# Patient Record
Sex: Male | Born: 1935 | Race: White | Hispanic: No | Marital: Married | Smoking: Former smoker
Health system: Southern US, Community
[De-identification: ages and names within clinical notes are randomized; demographics above are authoritative.]

## PROBLEM LIST (undated history)

## (undated) DIAGNOSIS — E538 Deficiency of other specified B group vitamins: Secondary | ICD-10-CM

## (undated) DIAGNOSIS — E039 Hypothyroidism, unspecified: Secondary | ICD-10-CM

## (undated) DIAGNOSIS — F0390 Unspecified dementia without behavioral disturbance: Secondary | ICD-10-CM

## (undated) DIAGNOSIS — E669 Obesity, unspecified: Secondary | ICD-10-CM

## (undated) DIAGNOSIS — R4182 Altered mental status, unspecified: Secondary | ICD-10-CM

## (undated) DIAGNOSIS — I10 Essential (primary) hypertension: Secondary | ICD-10-CM

## (undated) DIAGNOSIS — I5022 Chronic systolic (congestive) heart failure: Secondary | ICD-10-CM

## (undated) DIAGNOSIS — I2510 Atherosclerotic heart disease of native coronary artery without angina pectoris: Secondary | ICD-10-CM

## (undated) DIAGNOSIS — I255 Ischemic cardiomyopathy: Secondary | ICD-10-CM

## (undated) DIAGNOSIS — F329 Major depressive disorder, single episode, unspecified: Secondary | ICD-10-CM

## (undated) DIAGNOSIS — Z950 Presence of cardiac pacemaker: Secondary | ICD-10-CM

## (undated) DIAGNOSIS — N179 Acute kidney failure, unspecified: Secondary | ICD-10-CM

## (undated) DIAGNOSIS — I350 Nonrheumatic aortic (valve) stenosis: Secondary | ICD-10-CM

## (undated) DIAGNOSIS — S329XXA Fracture of unspecified parts of lumbosacral spine and pelvis, initial encounter for closed fracture: Secondary | ICD-10-CM

## (undated) DIAGNOSIS — I442 Atrioventricular block, complete: Secondary | ICD-10-CM

## (undated) DIAGNOSIS — F32A Depression, unspecified: Secondary | ICD-10-CM

## (undated) HISTORY — DX: Altered mental status, unspecified: R41.82

## (undated) HISTORY — DX: Obesity, unspecified: E66.9

## (undated) HISTORY — DX: Deficiency of other specified B group vitamins: E53.8

## (undated) HISTORY — DX: Fracture of unspecified parts of lumbosacral spine and pelvis, initial encounter for closed fracture: S32.9XXA

## (undated) HISTORY — DX: Depression, unspecified: F32.A

## (undated) HISTORY — DX: Major depressive disorder, single episode, unspecified: F32.9

## (undated) HISTORY — DX: Hypothyroidism, unspecified: E03.9

## (undated) HISTORY — DX: Atrioventricular block, complete: I44.2

## (undated) HISTORY — DX: Unspecified dementia, unspecified severity, without behavioral disturbance, psychotic disturbance, mood disturbance, and anxiety: F03.90

## (undated) HISTORY — DX: Atherosclerotic heart disease of native coronary artery without angina pectoris: I25.10

## (undated) HISTORY — PX: CHOLECYSTECTOMY: SHX55

## (undated) HISTORY — PX: INSERT / REPLACE / REMOVE PACEMAKER: SUR710

## (undated) HISTORY — DX: Ischemic cardiomyopathy: I25.5

## (undated) HISTORY — DX: Chronic systolic (congestive) heart failure: I50.22

## (undated) HISTORY — DX: Acute kidney failure, unspecified: N17.9

## (undated) HISTORY — PX: FRACTURE SURGERY: SHX138

## (undated) HISTORY — DX: Nonrheumatic aortic (valve) stenosis: I35.0

---

## 1999-11-17 ENCOUNTER — Encounter: Payer: Self-pay | Admitting: Orthopedic Surgery

## 1999-11-17 ENCOUNTER — Encounter: Admission: RE | Admit: 1999-11-17 | Discharge: 1999-11-17 | Payer: Self-pay | Admitting: Orthopedic Surgery

## 1999-11-23 ENCOUNTER — Ambulatory Visit (HOSPITAL_COMMUNITY): Admission: RE | Admit: 1999-11-23 | Discharge: 1999-11-23 | Payer: Self-pay | Admitting: Orthopedic Surgery

## 1999-11-23 ENCOUNTER — Encounter: Payer: Self-pay | Admitting: Orthopedic Surgery

## 1999-11-30 ENCOUNTER — Encounter: Payer: Self-pay | Admitting: Orthopedic Surgery

## 1999-12-03 ENCOUNTER — Inpatient Hospital Stay (HOSPITAL_COMMUNITY): Admission: RE | Admit: 1999-12-03 | Discharge: 1999-12-04 | Payer: Self-pay | Admitting: Orthopedic Surgery

## 1999-12-03 ENCOUNTER — Encounter: Payer: Self-pay | Admitting: Orthopedic Surgery

## 1999-12-16 ENCOUNTER — Emergency Department (HOSPITAL_COMMUNITY): Admission: EM | Admit: 1999-12-16 | Discharge: 1999-12-16 | Payer: Self-pay | Admitting: Emergency Medicine

## 1999-12-25 ENCOUNTER — Encounter: Admission: RE | Admit: 1999-12-25 | Discharge: 1999-12-25 | Payer: Self-pay | Admitting: Family Medicine

## 1999-12-25 ENCOUNTER — Encounter: Payer: Self-pay | Admitting: Family Medicine

## 1999-12-26 ENCOUNTER — Emergency Department (HOSPITAL_COMMUNITY): Admission: EM | Admit: 1999-12-26 | Discharge: 1999-12-26 | Payer: Self-pay

## 2000-01-02 ENCOUNTER — Encounter: Admission: RE | Admit: 2000-01-02 | Discharge: 2000-01-02 | Payer: Self-pay | Admitting: Gastroenterology

## 2000-01-02 ENCOUNTER — Encounter: Payer: Self-pay | Admitting: Gastroenterology

## 2000-03-10 ENCOUNTER — Encounter: Admission: RE | Admit: 2000-03-10 | Discharge: 2000-06-08 | Payer: Self-pay | Admitting: Family Medicine

## 2000-05-30 ENCOUNTER — Inpatient Hospital Stay (HOSPITAL_COMMUNITY): Admission: EM | Admit: 2000-05-30 | Discharge: 2000-06-02 | Payer: Self-pay | Admitting: Emergency Medicine

## 2002-12-22 ENCOUNTER — Inpatient Hospital Stay (HOSPITAL_COMMUNITY): Admission: EM | Admit: 2002-12-22 | Discharge: 2002-12-24 | Payer: Self-pay | Admitting: Emergency Medicine

## 2002-12-23 ENCOUNTER — Encounter (INDEPENDENT_AMBULATORY_CARE_PROVIDER_SITE_OTHER): Payer: Self-pay | Admitting: Cardiology

## 2003-02-02 ENCOUNTER — Encounter: Admission: RE | Admit: 2003-02-02 | Discharge: 2003-02-02 | Payer: Self-pay | Admitting: Cardiology

## 2003-02-07 ENCOUNTER — Inpatient Hospital Stay (HOSPITAL_COMMUNITY): Admission: RE | Admit: 2003-02-07 | Discharge: 2003-02-11 | Payer: Self-pay | Admitting: Cardiology

## 2003-08-17 ENCOUNTER — Inpatient Hospital Stay (HOSPITAL_COMMUNITY): Admission: EM | Admit: 2003-08-17 | Discharge: 2003-08-18 | Payer: Self-pay | Admitting: Emergency Medicine

## 2003-11-30 ENCOUNTER — Emergency Department (HOSPITAL_COMMUNITY): Admission: EM | Admit: 2003-11-30 | Discharge: 2003-11-30 | Payer: Self-pay | Admitting: Emergency Medicine

## 2003-12-01 ENCOUNTER — Inpatient Hospital Stay (HOSPITAL_COMMUNITY): Admission: EM | Admit: 2003-12-01 | Discharge: 2003-12-06 | Payer: Self-pay | Admitting: Emergency Medicine

## 2004-05-22 ENCOUNTER — Inpatient Hospital Stay (HOSPITAL_COMMUNITY): Admission: EM | Admit: 2004-05-22 | Discharge: 2004-05-23 | Payer: Self-pay | Admitting: Emergency Medicine

## 2004-06-12 ENCOUNTER — Encounter: Admission: RE | Admit: 2004-06-12 | Discharge: 2004-06-12 | Payer: Self-pay | Admitting: Family Medicine

## 2004-08-01 ENCOUNTER — Emergency Department (HOSPITAL_COMMUNITY): Admission: EM | Admit: 2004-08-01 | Discharge: 2004-08-01 | Payer: Self-pay | Admitting: Emergency Medicine

## 2004-08-03 ENCOUNTER — Inpatient Hospital Stay (HOSPITAL_COMMUNITY): Admission: EM | Admit: 2004-08-03 | Discharge: 2004-08-10 | Payer: Self-pay | Admitting: Emergency Medicine

## 2004-08-07 HISTORY — PX: HEMIARTHROPLASTY HIP: SUR652

## 2005-06-07 ENCOUNTER — Inpatient Hospital Stay (HOSPITAL_COMMUNITY): Admission: EM | Admit: 2005-06-07 | Discharge: 2005-06-09 | Payer: Self-pay | Admitting: Emergency Medicine

## 2005-06-21 ENCOUNTER — Ambulatory Visit (HOSPITAL_COMMUNITY): Admission: RE | Admit: 2005-06-21 | Discharge: 2005-06-21 | Payer: Self-pay | Admitting: Urology

## 2005-06-21 HISTORY — PX: CIRCUMCISION: SHX1350

## 2006-01-16 ENCOUNTER — Encounter: Admission: RE | Admit: 2006-01-16 | Discharge: 2006-01-16 | Payer: Self-pay | Admitting: Cardiology

## 2006-01-16 ENCOUNTER — Inpatient Hospital Stay (HOSPITAL_COMMUNITY): Admission: EM | Admit: 2006-01-16 | Discharge: 2006-01-20 | Payer: Self-pay | Admitting: Emergency Medicine

## 2006-01-16 ENCOUNTER — Encounter (INDEPENDENT_AMBULATORY_CARE_PROVIDER_SITE_OTHER): Payer: Self-pay | Admitting: Cardiovascular Disease

## 2006-07-14 ENCOUNTER — Inpatient Hospital Stay (HOSPITAL_COMMUNITY): Admission: EM | Admit: 2006-07-14 | Discharge: 2006-07-16 | Payer: Self-pay | Admitting: Emergency Medicine

## 2006-09-10 ENCOUNTER — Inpatient Hospital Stay (HOSPITAL_COMMUNITY): Admission: EM | Admit: 2006-09-10 | Discharge: 2006-09-13 | Payer: Self-pay | Admitting: Emergency Medicine

## 2006-09-10 ENCOUNTER — Ambulatory Visit: Payer: Self-pay | Admitting: Cardiology

## 2006-09-17 ENCOUNTER — Ambulatory Visit: Payer: Self-pay | Admitting: Internal Medicine

## 2006-09-18 ENCOUNTER — Observation Stay (HOSPITAL_COMMUNITY): Admission: AD | Admit: 2006-09-18 | Discharge: 2006-09-19 | Payer: Self-pay | Admitting: Internal Medicine

## 2006-09-18 ENCOUNTER — Ambulatory Visit: Payer: Self-pay | Admitting: Internal Medicine

## 2006-10-23 ENCOUNTER — Ambulatory Visit: Payer: Self-pay | Admitting: Internal Medicine

## 2006-10-23 LAB — CONVERTED CEMR LAB
BUN: 19 mg/dL (ref 6–23)
CO2: 29 meq/L (ref 19–32)
GFR calc non Af Amer: 51 mL/min
Glucose, Bld: 161 mg/dL — ABNORMAL HIGH (ref 70–99)
Potassium: 3.8 meq/L (ref 3.5–5.1)

## 2006-11-13 ENCOUNTER — Ambulatory Visit: Payer: Self-pay | Admitting: Internal Medicine

## 2007-01-21 ENCOUNTER — Inpatient Hospital Stay (HOSPITAL_COMMUNITY): Admission: EM | Admit: 2007-01-21 | Discharge: 2007-01-24 | Payer: Self-pay | Admitting: Emergency Medicine

## 2007-01-21 ENCOUNTER — Ambulatory Visit: Payer: Self-pay | Admitting: Cardiovascular Disease

## 2007-03-12 ENCOUNTER — Ambulatory Visit: Payer: Self-pay | Admitting: Cardiology

## 2007-03-24 ENCOUNTER — Ambulatory Visit: Payer: Self-pay | Admitting: Cardiology

## 2007-03-24 ENCOUNTER — Ambulatory Visit: Payer: Self-pay | Admitting: Internal Medicine

## 2007-03-25 ENCOUNTER — Encounter: Payer: Self-pay | Admitting: Cardiology

## 2007-03-25 ENCOUNTER — Inpatient Hospital Stay (HOSPITAL_COMMUNITY): Admission: AD | Admit: 2007-03-25 | Discharge: 2007-03-26 | Payer: Self-pay | Admitting: Cardiology

## 2007-03-25 ENCOUNTER — Ambulatory Visit: Payer: Self-pay | Admitting: Vascular Surgery

## 2007-09-16 ENCOUNTER — Ambulatory Visit: Payer: Self-pay | Admitting: Internal Medicine

## 2007-12-20 ENCOUNTER — Ambulatory Visit: Payer: Self-pay | Admitting: Internal Medicine

## 2007-12-20 ENCOUNTER — Inpatient Hospital Stay (HOSPITAL_COMMUNITY): Admission: EM | Admit: 2007-12-20 | Discharge: 2007-12-31 | Payer: Self-pay | Admitting: Emergency Medicine

## 2007-12-28 ENCOUNTER — Ambulatory Visit: Payer: Self-pay | Admitting: Physical Medicine & Rehabilitation

## 2008-01-07 ENCOUNTER — Encounter: Payer: Self-pay | Admitting: Internal Medicine

## 2008-01-20 ENCOUNTER — Ambulatory Visit: Payer: Self-pay | Admitting: Cardiology

## 2008-01-20 ENCOUNTER — Ambulatory Visit: Payer: Self-pay | Admitting: Internal Medicine

## 2008-05-25 DIAGNOSIS — I5022 Chronic systolic (congestive) heart failure: Secondary | ICD-10-CM

## 2008-05-25 DIAGNOSIS — E785 Hyperlipidemia, unspecified: Secondary | ICD-10-CM | POA: Insufficient documentation

## 2008-05-25 DIAGNOSIS — I2510 Atherosclerotic heart disease of native coronary artery without angina pectoris: Secondary | ICD-10-CM

## 2008-05-25 DIAGNOSIS — I10 Essential (primary) hypertension: Secondary | ICD-10-CM

## 2008-05-26 ENCOUNTER — Encounter: Payer: Self-pay | Admitting: Internal Medicine

## 2008-05-26 ENCOUNTER — Encounter (INDEPENDENT_AMBULATORY_CARE_PROVIDER_SITE_OTHER): Payer: Self-pay | Admitting: *Deleted

## 2008-05-26 ENCOUNTER — Ambulatory Visit: Payer: Self-pay | Admitting: Internal Medicine

## 2008-05-26 DIAGNOSIS — I442 Atrioventricular block, complete: Secondary | ICD-10-CM

## 2008-05-26 DIAGNOSIS — I238 Other current complications following acute myocardial infarction: Secondary | ICD-10-CM | POA: Insufficient documentation

## 2008-07-08 ENCOUNTER — Encounter (INDEPENDENT_AMBULATORY_CARE_PROVIDER_SITE_OTHER): Payer: Self-pay

## 2008-08-08 ENCOUNTER — Encounter: Payer: Self-pay | Admitting: Internal Medicine

## 2008-09-12 ENCOUNTER — Inpatient Hospital Stay (HOSPITAL_COMMUNITY): Admission: EM | Admit: 2008-09-12 | Discharge: 2008-09-14 | Payer: Self-pay | Admitting: Emergency Medicine

## 2008-09-12 ENCOUNTER — Ambulatory Visit: Payer: Self-pay | Admitting: Cardiovascular Disease

## 2008-09-12 ENCOUNTER — Ambulatory Visit: Payer: Self-pay | Admitting: Cardiology

## 2008-09-12 ENCOUNTER — Encounter: Payer: Self-pay | Admitting: Internal Medicine

## 2008-09-12 ENCOUNTER — Telehealth: Payer: Self-pay | Admitting: Internal Medicine

## 2008-09-13 ENCOUNTER — Encounter: Payer: Self-pay | Admitting: Cardiology

## 2008-09-15 ENCOUNTER — Encounter: Admission: RE | Admit: 2008-09-15 | Discharge: 2008-09-15 | Payer: Self-pay | Admitting: Internal Medicine

## 2008-09-15 ENCOUNTER — Telehealth: Payer: Self-pay | Admitting: Internal Medicine

## 2008-09-21 ENCOUNTER — Telehealth: Payer: Self-pay | Admitting: Internal Medicine

## 2008-09-23 ENCOUNTER — Encounter: Payer: Self-pay | Admitting: Nurse Practitioner

## 2008-09-23 ENCOUNTER — Ambulatory Visit: Payer: Self-pay | Admitting: Cardiovascular Disease

## 2008-09-23 DIAGNOSIS — F329 Major depressive disorder, single episode, unspecified: Secondary | ICD-10-CM

## 2008-09-28 ENCOUNTER — Telehealth: Payer: Self-pay | Admitting: Internal Medicine

## 2009-01-11 ENCOUNTER — Encounter (INDEPENDENT_AMBULATORY_CARE_PROVIDER_SITE_OTHER): Payer: Self-pay | Admitting: *Deleted

## 2009-02-14 ENCOUNTER — Encounter (INDEPENDENT_AMBULATORY_CARE_PROVIDER_SITE_OTHER): Payer: Self-pay | Admitting: *Deleted

## 2009-02-18 ENCOUNTER — Encounter (INDEPENDENT_AMBULATORY_CARE_PROVIDER_SITE_OTHER): Payer: Self-pay | Admitting: *Deleted

## 2009-02-20 ENCOUNTER — Telehealth: Payer: Self-pay | Admitting: Internal Medicine

## 2009-02-23 ENCOUNTER — Encounter: Payer: Self-pay | Admitting: Internal Medicine

## 2009-03-25 ENCOUNTER — Encounter: Payer: Self-pay | Admitting: Internal Medicine

## 2009-03-28 ENCOUNTER — Ambulatory Visit: Payer: Self-pay | Admitting: Internal Medicine

## 2009-03-28 DIAGNOSIS — I4891 Unspecified atrial fibrillation: Secondary | ICD-10-CM

## 2009-04-26 ENCOUNTER — Emergency Department (HOSPITAL_COMMUNITY): Admission: EM | Admit: 2009-04-26 | Discharge: 2009-04-26 | Payer: Self-pay | Admitting: Emergency Medicine

## 2009-04-29 ENCOUNTER — Inpatient Hospital Stay (HOSPITAL_COMMUNITY): Admission: EM | Admit: 2009-04-29 | Discharge: 2009-05-04 | Payer: Self-pay | Admitting: Emergency Medicine

## 2009-05-25 ENCOUNTER — Ambulatory Visit: Payer: Self-pay | Admitting: Internal Medicine

## 2009-12-25 ENCOUNTER — Telehealth: Payer: Self-pay | Admitting: Internal Medicine

## 2009-12-26 ENCOUNTER — Encounter: Admission: RE | Admit: 2009-12-26 | Discharge: 2009-12-26 | Payer: Self-pay | Admitting: Internal Medicine

## 2010-01-17 ENCOUNTER — Encounter: Payer: Self-pay | Admitting: Internal Medicine

## 2010-01-17 ENCOUNTER — Ambulatory Visit: Payer: Self-pay | Admitting: Internal Medicine

## 2010-01-18 ENCOUNTER — Telehealth: Payer: Self-pay | Admitting: Internal Medicine

## 2010-04-26 NOTE — Letter (Signed)
Summary: [facility]   Imported By: Kassie Mends 05/15/2009 13:05:05  _____________________________________________________________________  External Attachment:    Type:   Image     Comment:   External Document

## 2010-04-26 NOTE — Assessment & Plan Note (Signed)
Summary: 6 month rck/mt   Visit Type:  Follow-up Primary Provider:  jones,tommy MD  CC:  no complaints.  History of Present Illness:  [name] is an 77 y/o male with multiple medical problems including dementia, CAD, CHF due to ischemic CM with EF 40-45%. He has cardiomems sensor in place. He is currently living in a nursing home. Returns for f/u.   Doing well. Denies CP, sob or edema. Says he is mostly in a wheelchair but occasionally uses a walker. Gets all meds from the nursing home. No orthopnea or PND. Occasional bruising. Not taking a diuretic.   We discussed Code status and he confirms that he wnats to be full Code.  Current Medications (verified): 1)  Zoloft 50 Mg Tabs (Sertraline Hcl) .Marland Kitchen.. 1 Daily 2)  Amaryl 4 Mg Tabs (Glimepiride) .... Take 1 Once Daily 3)  Reglan 5 Mg Tabs (Metoclopramide Hcl) .... Take 1 Once Daily 4)  Plavix 75 Mg Tabs (Clopidogrel Bisulfate) .... Take One Tablet By Mouth Daily 5)  Multivitamins   Tabs (Multiple Vitamin) .... Once Daily 6)  Aspirin 81 Mg Tbec (Aspirin) .... Take One Tablet By Mouth Daily 7)  Synthroid 100 Mcg Tabs (Levothyroxine Sodium) .... Take 1 Once Daily 8)  Xanax 0.25 Mg Tabs (Alprazolam) .... Two Times A Day As Needed 9)  Nitroglycerin 0.4 Mg Subl (Nitroglycerin) .... One Tablet Under Tongue Every 5 Minutes As Needed For Chest Pain---May Repeat Times Three 10)  Coreg 3.125 Mg Tabs (Carvedilol) .Marland Kitchen.. 1 Twice A Day 11)  Vitamin D (Ergocalciferol) 50000 Unit Caps (Ergocalciferol) .... Monthly 12)  Cobal-1000 1000 Mcg/ml Soln (Cyanocobalamin) .... Monthly 13)  Acetaminophen 325 Mg  Tabs (Acetaminophen) .... As Needed 14)  Haloperidol 5 Mg Tabs (Haloperidol) .... As Needed 15)  Diphenhydramine Hcl 25 Mg  Caps (Diphenhydramine Hcl) .... As Needed 16)  Robitussin Dm 100-10 Mg/83ml Syrp (Dextromethorphan-Guaifenesin) .... As Needed 17)  Acetaminophen 325 Mg  Tabs (Acetaminophen) .... As Needed 18)  Imodium A-D 2 Mg Tabs (Loperamide Hcl)  .... As Needed 19)  Haloperidol 5 Mg Tabs (Haloperidol) .... Every 8 Hrs As Needed 20)  Simvastatin 20 Mg Tabs (Simvastatin) .... Take One Tablet By Mouth Daily At Bedtime 21)  Folic Acid   Powd (Folic Acid) .... Once Daily  Allergies (verified): 1)  ! Codeine 2)  ! Phenergan 3)  ! Ativan  Past History:  Past Medical History: Last updated: 05/26/2008  1. CHF      a.  Mixed cardiomyopathy EF 40-45%      b.  s/p implantation of CardioMems pulmonary artery sensor  2. CAD       a.  s/p remote balloon PTCA of the LAD with 50% residual.   3. Poorly controlled hypertension.   4. Hyperthyroidism.   5. Diabetes, diet controlled.   6. Depression.   7. B12 deficiency.   8. Bradycardia status post dual chamber pacemaker placed by Dr. Armanda Magic of [facility].   9. Dyslipidemia.  10. Klebsiella UTI and septic shock 9/09  Review of Systems       As per HPI and past medical history; otherwise all systems negative.   Vital Signs:  Patient profile:   77 year old male Height:      69 inches Weight:      206 pounds BMI:     30.53 Pulse rate:   80 / minute BP sitting:   128 / 70  (left arm) Cuff size:  regular  Vitals Entered By: Hardin Negus, RMA (January 17, 2010 3:17 PM)  Physical Exam  General:  Elderly male sitting in [facility] no acute distress. HEENT Normal.  Neck veins were flat, carotids were brisk.  Lungs minimal basilarcrackles Heart sounds were regular without murmurs or gallops.  Abdomen was soft with active bowel sounds. There is no clubbing cyanosis; trace edema Skin Warm and dry Alert. Follows commands. Affect pleasant.   PPM Specifications Following MD:  Sherryl Manges, MD     PPM Vendor:  Medtronic     PPM Model Number:  [phone]     PPM Serial Number:  [id number] PPM DOI:  12/23/2002     PPM Implanting MD:  Sherryl Manges, MD  Lead 1    Location: RA     DOI: 12/23/2002     Model #: [id number]     Serial #: [id number]     Status: active Lead 2    Location:  RV     DOI: 12/23/2002     Model #: [id number]     Serial #: [id number]     Status: active   Indications:  CHB   PPM Follow Up Pacer Dependent:  Yes      Episodes Coumadin:  No  Parameters Mode:  DDDR     Lower Rate Limit:  70     Upper Rate Limit:  120 Paced AV Delay:  180     Sensed AV Delay:  160  Impression & Recommendations:  Problem # 1:  CAD, NATIVE VESSEL (ICD-414.01) Stable. No evidence of ischemia. Continue current regimen.  Problem # 2:  SYSTOLIC HEART FAILURE, CHRONIC (ICD-428.22) Doing well. Volume status looks good. Unable to tolerate ACE-I or further titration of b-blocker.   Problem # 3:  PACEMAKER,DDD MDT (ICD-V45.01) Interrogated today.  Device functioning normally.   Other Orders: EKG w/ Interpretation (93000)  Patient Instructions: 1)  Your physician wants you to follow-up in: 1 year  You will receive a reminder letter in the mail two months in advance. If you don't receive a letter, please call our office to schedule the follow-up appointment.

## 2010-04-26 NOTE — Procedures (Signed)
Summary: [facility] Device [facility]   Current Medications (verified): 1)  Zoloft 50 Mg Tabs (Sertraline Hcl) .Marland Kitchen.. 1 Daily 2)  Amaryl 4 Mg Tabs (Glimepiride) .... Take 1 Once Daily 3)  Reglan 5 Mg Tabs (Metoclopramide Hcl) .... Take 1 Once Daily 4)  Plavix 75 Mg Tabs (Clopidogrel Bisulfate) .... Take One Tablet By Mouth Daily 5)  Multivitamins   Tabs (Multiple Vitamin) .... Once Daily 6)  Aspirin 81 Mg Tbec (Aspirin) .... Take One Tablet By Mouth Daily 7)  Synthroid 100 Mcg Tabs (Levothyroxine Sodium) .... Take 1 Once Daily 8)  Xanax 0.25 Mg Tabs (Alprazolam) .... Two Times A Day As Needed 9)  Nitroglycerin 0.4 Mg Subl (Nitroglycerin) .... One Tablet Under Tongue Every 5 Minutes As Needed For Chest Pain---May Repeat Times Three 10)  Coreg 3.125 Mg Tabs (Carvedilol) .Marland Kitchen.. 1 Twice A Day 11)  Vitamin D (Ergocalciferol) 50000 Unit Caps (Ergocalciferol) .... Monthly 12)  Cobal-1000 1000 Mcg/ml Soln (Cyanocobalamin) .... Monthly 13)  Acetaminophen 325 Mg  Tabs (Acetaminophen) .... As Needed 14)  Haloperidol 5 Mg Tabs (Haloperidol) .... As Needed 15)  Diphenhydramine Hcl 25 Mg  Caps (Diphenhydramine Hcl) .... As Needed 16)  Robitussin Dm 100-10 Mg/8ml Syrp (Dextromethorphan-Guaifenesin) .... As Needed 17)  Acetaminophen 325 Mg  Tabs (Acetaminophen) .... As Needed 18)  Imodium A-D 2 Mg Tabs (Loperamide Hcl) .... As Needed 19)  Haloperidol 5 Mg Tabs (Haloperidol) .... Every 8 Hrs As Needed 20)  Simvastatin 20 Mg Tabs (Simvastatin) .... Take One Tablet By Mouth Daily At Bedtime 21)  Folic Acid   Powd (Folic Acid) .... Once Daily  Allergies (verified): 1)  ! Codeine 2)  ! Phenergan 3)  ! Ativan  PPM Specifications Following MD:  Sherryl Manges, MD     PPM Vendor:  Medtronic     PPM Model Number:  [id number]     PPM Serial Number:  [id number] PPM DOI:  12/23/2002     PPM Implanting MD:  Sherryl Manges, MD  Lead 1    Location: RA     DOI: 12/23/2002     Model #: 5594     Serial #: [id number]     Status:  active Lead 2    Location: RV     DOI: 12/23/2002     Model #: 3086     Serial #: [id number]     Status: active   Indications:  CHB   PPM Follow Up Battery Voltage:  2.74 V     Battery Est. Longevity:  24 mths     Pacer Dependent:  Yes       PPM Device Measurements Atrium  Amplitude: 2.00 mV, Impedance: 511 ohms, Threshold: 1.00 V at 0.50 msec Right Ventricle  Amplitude: PACED mV, Impedance: 700 ohms, Threshold: 1.00 V at 0.40 msec  Episodes MS Episodes:  31     Percent Mode Switch:  0     Coumadin:  No Ventricular High Rate:  0     Atrial Pacing:  99.4%     Ventricular Pacing:  99.9%  Parameters Mode:  DDIR     Lower Rate Limit:  70     Upper Rate Limit:  120 Paced AV Delay:  180     Sensed AV Delay:  160 Next Cardiology Appt Due:  06/25/2010 Tech Comments:  31 AHR EPISODES--LONGEST WAS 40 MINUTES 30 SECONDS.  NORMAL DEVICE FUNCTION.  NO CHANGES MADE. ROV IN 6 MTHS W/DEVICE CLINIC. Vella Kohler  January 17, 2010 4:05 PM

## 2010-04-26 NOTE — Progress Notes (Signed)
Summary: re meds/appt with pa  Phone Note Call from Patient   Caller: Daughter susan Reason for Call: Talk to Nurse Summary of Call: pls call pt's dtr susan, pt hasn't had any meds on sunday or so far today due to place on his head that's bleeding-also wants to know if our pa can see him, i told her i didn't think so, since it was not heart related- [phone] Initial call taken by: Glynda Jaeger,  December 25, 2009 11:30 AM  Follow-up for Phone Call        Left message to call back Meredith Staggers, RN  December 25, 2009 5:59 PM   spoke w/pts daughter, pt has been having severe nose bleeds, he soaks the bed and will have puddles of blood in the floor, she is very frustrated w/[facility] and wanted him to be seen today advised not cardiac no one could see him today she states he is still bleeding and they have ice packs on his face and it is cont. she will take him to ER Meredith Staggers, RN  December 26, 2009 1:17 PM

## 2010-04-26 NOTE — Letter (Signed)
Summary: [facility] Diagnoses Sheet  [facility] Diagnoses Sheet   Imported By: Kassie Mends 05/15/2009 13:04:08  _____________________________________________________________________  External Attachment:    Type:   Image     Comment:   External Document

## 2010-04-26 NOTE — Letter (Signed)
Summary: [facility]  [facility]   Imported By: Kassie Mends 05/15/2009 13:02:29  _____________________________________________________________________  External Attachment:    Type:   Image     Comment:   External Document

## 2010-04-26 NOTE — Assessment & Plan Note (Signed)
Summary: df2/jss   CC:  pt having difficulty sleeping at night.  cat naps during the day.  Weakness in legs.  terrible cough according to pt daughter.  She would like it written that he receives atleast one xanax daily and preferrable in the evening.  History of Present Illness:    [name] is seen in followup for pacemaker implanted years ago for bradycardia further complicated by complete heart block. His ischemic heart disease with modest depression of LV systolic function at 40% and prior intervention of his LAD.  He has a history of recurrent and significant orthostatic lightheadedness resulting in multiple falls.  He has a recent intercurrent illness characterized by fever and significant cough is gradually getting better. The patient denies  chest pain, edema or palpitations   Current Medications (verified): 1)  Zoloft 50 Mg Tabs (Sertraline Hcl) .Marland Kitchen.. 1 Daily 2)  Amaryl 4 Mg Tabs (Glimepiride) .... Take 1 Once Daily 3)  Reglan 5 Mg Tabs (Metoclopramide Hcl) .... Take 1 Once Daily 4)  Zocor 20 Mg Tabs (Simvastatin) .... Take 1 At Bedtime 5)  Plavix 75 Mg Tabs (Clopidogrel Bisulfate) .... Take One Tablet By Mouth Daily 6)  Multivitamins   Tabs (Multiple Vitamin) .... Once Daily 7)  Aspirin 81 Mg Tbec (Aspirin) .... Take One Tablet By Mouth Daily 8)  Synthroid 100 Mcg Tabs (Levothyroxine Sodium) .... Take 1 Once Daily 9)  Xanax 0.5 Mg Tabs (Alprazolam) .... Two Times A Day As Needed 10)  Nitroglycerin 0.4 Mg Subl (Nitroglycerin) .... One Tablet Under Tongue Every 5 Minutes As Needed For Chest Pain---May Repeat Times Three 11)  Coreg 3.125 Mg Tabs (Carvedilol) .Marland Kitchen.. 1 Twice A Day 12)  Furosemide 40 Mg Tabs (Furosemide) .... Take 1 Tablet By Mouth Once A Day 13)  Imdur 30 Mg Xr24h-Tab (Isosorbide Mononitrate) .... Take 1 Tablet By Mouth Once A Day  Allergies (verified): 1)  ! Codeine 2)  ! Phenergan 3)  ! Ativan  Past History:  Past Medical History: Last updated:  05/26/2008  1. CHF      a.  Mixed cardiomyopathy EF 40-45%      b.  s/p implantation of CardioMems pulmonary artery sensor  2. CAD       a.  s/p remote balloon PTCA of the LAD with 50% residual.   3. Poorly controlled hypertension.   4. Hyperthyroidism.   5. Diabetes, diet controlled.   6. Depression.   7. B12 deficiency.   8. Bradycardia status post dual chamber pacemaker placed by Dr. Armanda Magic of [facility].   9. Dyslipidemia.  10. Klebsiella UTI and septic shock 9/09  Family History: Last updated: 05/25/2008 Noncontributory for early coronary disease, though his   father died in his 54s of unknown causes.   Social History: Last updated: 05/25/2008 He lives in [place] and lives alone.  His daughter is his   healthcare power of attorney.  He quit smoking 35 years ago after 25-30   pack-years.  He was [place].  He   is retired.     Vital Signs:  Patient profile:   77 year old male Height:      69 inches Weight:      212 pounds Pulse rate:   76 / minute Pulse rhythm:   regular BP sitting:   87 / 55  (left arm) Cuff size:   regular  Vitals Entered By: Judithe Modest  CMA (March 28, 2009 3:17 PM)  Physical Exam  General:  The patient was alert and oriented in no acute distress; occasionally coughing HEENT Normal.  Neck veins were flat, carotids were brisk.  Lungs were diffuse interstitial crackles Heart sounds were regular without murmurs or gallops.  Abdomen was soft with active bowel sounds. There is no clubbing cyanosis; trace edema Skin Warm and dry    PPM Specifications Following MD:  Sherryl Manges, MD     PPM Vendor:  Medtronic     PPM Model Number:  [id number]     PPM Serial Number:  [id number] PPM DOI:  12/23/2002     PPM Implanting MD:  Sherryl Manges, MD  Lead 1    Location: RA     DOI: 12/23/2002     Model #: [id number]     Serial #: [id number]     Status: active Lead 2    Location: RV     DOI: 12/23/2002      Model #: 3664     Serial #: [id number]     Status: active   Indications:  CHB   PPM Follow Up Remote Check?  No Battery Voltage:  2.76 V     Battery Est. Longevity:  3 YEARS     Pacer Dependent:  Yes       PPM Device Measurements Atrium  Amplitude: 2.8 mV, Impedance: 533 ohms,  Right Ventricle  Amplitude: PACED AT 30 mV, Impedance: 658 ohms, Threshold: 0.5 V at 0.4 msec  Episodes MS Episodes:  604     Percent Mode Switch:  58.9%     Coumadin:  No Ventricular High Rate:  0     Atrial Pacing:  62.2%     Ventricular Pacing:  99.7%  Parameters Mode:  DDDR     Lower Rate Limit:  70     Upper Rate Limit:  120 Paced AV Delay:  180     Sensed AV Delay:  160 Next Cardiology Appt Due:  09/22/2009 Tech Comments:  Normal device function.  Pt in atrial flutter today, apparently new diagnosis.  Heart rates controlled because of CHB.  No changes made today.  ROV 6 months device clinic. Gypsy Balsam RN BSN  March 28, 2009 3:23 PM   Impression & Recommendations:  Problem # 1:  ATRIAL FLUTTER (ICD-427.32) he is identified as having atrial flutter based on interrogation of his device.  I have reviewed with his daughter risks factors for thromboembolism. We have also reviewed the risks of oral anticoagulation therapy. In the history of his recurring falls, I think it is an inappropriate at this time to begin therapy with Coumadin. In the event that this situation can be a ameliorated he could be considered later (see below). His updated medication list for this problem includes:    Plavix 75 Mg Tabs (Clopidogrel bisulfate) .Marland Kitchen... Take one tablet by mouth daily    Aspirin 81 Mg Tbec (Aspirin) .Marland Kitchen... Take one tablet by mouth daily    Coreg 3.125 Mg Tabs (Carvedilol) .Marland Kitchen... 1 twice a day  Problem # 2:  ORTHOSTATIC DIZZINESS AND SYNCOPE (ICD-780.4) He is having recurrent problems with falls upon standing. Given his underlying low blood pressure, we will doctors in door. I have also instructed the  nursing facility to put his bed in reverse Trendelenburg at 6 inches. We discussed the potential role of ProAmatine, will hold off on further therapies however until we see how the nonmedicinal approaches work.  Problem #  3:  PACEMAKER,DDD MDT (ICD-V45.01) normal device function with interrogation demonstrated atrial flutter  Problem # 4:  CAD, NATIVE VESSEL (ICD-414.01) stable coronary disease. Given his hypotension I think we need to stop his Imdur which I done His updated medication list for this problem includes:    Plavix 75 Mg Tabs (Clopidogrel bisulfate) .Marland Kitchen... Take one tablet by mouth daily    Aspirin 81 Mg Tbec (Aspirin) .Marland Kitchen... Take one tablet by mouth daily    Nitroglycerin 0.4 Mg Subl (Nitroglycerin) ..... One tablet under tongue every 5 minutes as needed for chest pain---may repeat times three    Coreg 3.125 Mg Tabs (Carvedilol) .Marland Kitchen... 1 twice a day    Imdur 30 Mg Xr24h-tab (Isosorbide mononitrate) .Marland Kitchen... Take 1 tablet by mouth once a day  Problem # 5:  SYSTOLIC HEART FAILURE, CHRONIC (ICD-428.22) stable on his current medications will continue him on his carvedilol His updated medication list for this problem includes:    Plavix 75 Mg Tabs (Clopidogrel bisulfate) .Marland Kitchen... Take one tablet by mouth daily    Aspirin 81 Mg Tbec (Aspirin) .Marland Kitchen... Take one tablet by mouth daily    Nitroglycerin 0.4 Mg Subl (Nitroglycerin) ..... One tablet under tongue every 5 minutes as needed for chest pain---may repeat times three    Coreg 3.125 Mg Tabs (Carvedilol) .Marland Kitchen... 1 twice a day    Furosemide 40 Mg Tabs (Furosemide) .Marland Kitchen... Take 1 tablet by mouth once a day    Imdur 30 Mg Xr24h-tab (Isosorbide mononitrate) .Marland Kitchen... Take 1 tablet by mouth once a day  Patient Instructions: 1)  Your physician recommends that you schedule a follow-up appointment in: 12 MONTHS

## 2010-04-26 NOTE — Assessment & Plan Note (Signed)
Summary: f1y/jml/pt seen in ed for CHF   Visit Type:  Follow-up Primary Provider:  jones,tommy MD  CC:  no complaints.  History of Present Illness:  Mr. Beggs is an 77 y/o male with multiple medical problems including dementia, CAD, CHF due to ischemic CM with EF 40-45%. He has cardiomems sensor in place. He is currently living in a nursing home. Returns for f/u.   Just got out of the hospital for a fall and brorken pelvis and renal failure due to volume depletion. Now gets around in wheelchair. No CP, SOB or edema. No orthopnea or PND>     Current Medications (verified): 1)  Zoloft 50 Mg Tabs (Sertraline Hcl) .Marland Kitchen.. 1 Daily 2)  Amaryl 4 Mg Tabs (Glimepiride) .... Take 1 Once Daily 3)  Reglan 5 Mg Tabs (Metoclopramide Hcl) .... Take 1 Once Daily 4)  Plavix 75 Mg Tabs (Clopidogrel Bisulfate) .... Take One Tablet By Mouth Daily 5)  Multivitamins   Tabs (Multiple Vitamin) .... Once Daily 6)  Aspirin 81 Mg Tbec (Aspirin) .... Take One Tablet By Mouth Daily 7)  Synthroid 100 Mcg Tabs (Levothyroxine Sodium) .... Take 1 Once Daily 8)  Xanax 0.25 Mg Tabs (Alprazolam) .... Two Times A Day As Needed 9)  Nitroglycerin 0.4 Mg Subl (Nitroglycerin) .... One Tablet Under Tongue Every 5 Minutes As Needed For Chest Pain---May Repeat Times Three 10)  Coreg 3.125 Mg Tabs (Carvedilol) .Marland Kitchen.. 1 Twice A Day 11)  Furosemide 40 Mg Tabs (Furosemide) .... Take 1 Tablet By Mouth Once A Day 12)  Vitamin D (Ergocalciferol) 50000 Unit Caps (Ergocalciferol) .... Monthly 13)  Cobal-1000 1000 Mcg/ml Soln (Cyanocobalamin) .... Monthly 14)  Acetaminophen 325 Mg  Tabs (Acetaminophen) .... As Needed 15)  Haloperidol 5 Mg Tabs (Haloperidol) .... As Needed 16)  Diphenhydramine Hcl 25 Mg  Caps (Diphenhydramine Hcl) .... As Needed 17)  Robitussin Dm 100-10 Mg/11ml Syrp (Dextromethorphan-Guaifenesin) .... As Needed  Allergies (verified): 1)  ! Codeine 2)  ! Phenergan 3)  ! Ativan  Past History:  Past Medical  History: Last updated: 05/26/2008  1. CHF      a.  Mixed cardiomyopathy EF 40-45%      b.  s/p implantation of CardioMems pulmonary artery sensor  2. CAD       a.  s/p remote balloon PTCA of the LAD with 50% residual.   3. Poorly controlled hypertension.   4. Hyperthyroidism.   5. Diabetes, diet controlled.   6. Depression.   7. B12 deficiency.   8. Bradycardia status post dual chamber pacemaker placed by Dr. Armanda Magic of [facility].   9. Dyslipidemia.  10. Klebsiella UTI and septic shock 9/09  Review of Systems       As per HPI and past medical history; otherwise all systems negative.   Vital Signs:  Patient profile:   77 year old male Height:      69 inches Weight:      191 pounds Pulse rate:   80 / minute BP sitting:   100 / 52  (right arm) Cuff size:   regular  Vitals Entered By: Hardin Negus, RMA (May 25, 2009 2:40 PM)  Physical Exam  General:  Elderly male sitting in WC no acute distress. HEENT Normal.  Neck veins were flat, carotids were brisk.  Lungs minimal basilarcrackles Heart sounds were regular without murmurs or gallops.  Abdomen was soft with active bowel sounds. There is no clubbing cyanosis; trace edema  Skin Warm and dry Alert. Follows commands. Affect pleasant.    PPM Specifications Following MD:  Sherryl Manges, MD     PPM Vendor:  Medtronic     PPM Model Number:  [id number]     PPM Serial Number:  [id number] DOI:  12/23/2002     PPM Implanting MD:  Sherryl Manges, MD  Lead 1    Location: RA     DOI: 12/23/2002     Model #: 5594     Serial #: [id number]     Status: active Lead 2    Location: RV     DOI: 12/23/2002     Model #: 1027     Serial #: [id number]     Status: active   Indications:  CHB   PPM Follow Up Pacer Dependent:  Yes      Episodes Coumadin:  No  Parameters Mode:  DDDR     Lower Rate Limit:  70     Upper Rate Limit:  120 Paced AV Delay:  180     Sensed AV Delay:  160  Impression &  Recommendations:  Problem # 1:  CAD, NATIVE VESSEL (ICD-414.01) Stable. No evidence of ischemia. Continue current regimen.  Problem # 2:  SYSTOLIC HEART FAILURE, CHRONIC (ICD-428.22) Doing relatively well. Volume status looks good. Lisinopril previously on hold due to labile renal function. Will try to restart lisinopril at 5mg  once daily. BMET next week.

## 2010-04-26 NOTE — Progress Notes (Signed)
Summary: discuss office visit on yesterday  Phone Note Call from Patient Call back at Home Phone [phone]   Caller: Daughter-susan [phone]  or [phone] Reason for Call: Talk to Nurse Details for Reason: discuss visit from yesterday.  Initial call taken by: Lorne Skeens,  January 18, 2010 12:40 PM  Follow-up for Phone Call        pts daughter given update on appt. Meredith Staggers, RN  January 19, 2010 2:15 PM

## 2010-04-26 NOTE — Cardiovascular Report (Signed)
Summary: Office Visit   Office Visit   Imported By: Roderic Ovens 02/01/2010 14:29:59  _____________________________________________________________________  External Attachment:    Type:   Image     Comment:   External Document

## 2010-04-26 NOTE — Miscellaneous (Signed)
  Clinical Lists Changes  Medications: Removed medication of IMDUR 30 MG XR24H-TAB (ISOSORBIDE MONONITRATE) Take 1 tablet by mouth once a day

## 2010-06-13 LAB — CBC
HCT: 32.3 % — ABNORMAL LOW (ref 39.0–52.0)
HCT: 32.8 % — ABNORMAL LOW (ref 39.0–52.0)
HCT: 33.2 % — ABNORMAL LOW (ref 39.0–52.0)
HCT: 34.7 % — ABNORMAL LOW (ref 39.0–52.0)
Hemoglobin: 10.6 g/dL — ABNORMAL LOW (ref 13.0–17.0)
Hemoglobin: 11.1 g/dL — ABNORMAL LOW (ref 13.0–17.0)
Hemoglobin: 12.4 g/dL — ABNORMAL LOW (ref 13.0–17.0)
MCHC: 34.4 g/dL (ref 30.0–36.0)
MCHC: 34.8 g/dL (ref 30.0–36.0)
MCHC: 34.9 g/dL (ref 30.0–36.0)
MCV: 89.7 fL (ref 78.0–100.0)
MCV: 90.1 fL (ref 78.0–100.0)
Platelets: 138 10*3/uL — ABNORMAL LOW (ref 150–400)
Platelets: 212 10*3/uL (ref 150–400)
RBC: 3.43 MIL/uL — ABNORMAL LOW (ref 4.22–5.81)
RBC: 3.52 MIL/uL — ABNORMAL LOW (ref 4.22–5.81)
RBC: 3.64 MIL/uL — ABNORMAL LOW (ref 4.22–5.81)
RBC: 3.83 MIL/uL — ABNORMAL LOW (ref 4.22–5.81)
RBC: 3.95 MIL/uL — ABNORMAL LOW (ref 4.22–5.81)
RDW: 13.5 % (ref 11.5–15.5)
RDW: 13.7 % (ref 11.5–15.5)
RDW: 14.1 % (ref 11.5–15.5)
RDW: 14.3 % (ref 11.5–15.5)
WBC: 4.9 10*3/uL (ref 4.0–10.5)
WBC: 4.9 10*3/uL (ref 4.0–10.5)
WBC: 5.4 10*3/uL (ref 4.0–10.5)
WBC: 6.8 10*3/uL (ref 4.0–10.5)

## 2010-06-13 LAB — COMPREHENSIVE METABOLIC PANEL
ALT: 29 U/L (ref 0–53)
ALT: 29 U/L (ref 0–53)
ALT: 33 U/L (ref 0–53)
AST: 33 U/L (ref 0–37)
Alkaline Phosphatase: 125 U/L — ABNORMAL HIGH (ref 39–117)
Alkaline Phosphatase: 136 U/L — ABNORMAL HIGH (ref 39–117)
BUN: 62 mg/dL — ABNORMAL HIGH (ref 6–23)
CO2: 23 mEq/L (ref 19–32)
CO2: 24 mEq/L (ref 19–32)
Calcium: 8.2 mg/dL — ABNORMAL LOW (ref 8.4–10.5)
Chloride: 108 mEq/L (ref 96–112)
Chloride: 110 mEq/L (ref 96–112)
GFR calc non Af Amer: 30 mL/min — ABNORMAL LOW (ref >60)
GFR calc non Af Amer: 34 mL/min — ABNORMAL LOW (ref >60)
Glucose, Bld: 113 mg/dL — ABNORMAL HIGH (ref 70–99)
Glucose, Bld: 85 mg/dL (ref 70–99)
Glucose, Bld: 96 mg/dL (ref 70–99)
Potassium: 3.6 mEq/L (ref 3.5–5.1)
Potassium: 4.3 mEq/L (ref 3.5–5.1)
Sodium: 140 mEq/L (ref 135–145)
Sodium: 144 mEq/L (ref 135–145)
Sodium: 147 mEq/L — ABNORMAL HIGH (ref 135–145)
Total Bilirubin: 0.8 mg/dL (ref 0.3–1.2)
Total Bilirubin: 0.9 mg/dL (ref 0.3–1.2)
Total Protein: 6.7 g/dL (ref 6.0–8.3)
Total Protein: 6.8 g/dL (ref 6.0–8.3)

## 2010-06-13 LAB — BASIC METABOLIC PANEL
BUN: 40 mg/dL — ABNORMAL HIGH (ref 6–23)
CO2: 22 mEq/L (ref 19–32)
CO2: 23 mEq/L (ref 19–32)
CO2: 24 mEq/L (ref 19–32)
Calcium: 8 mg/dL — ABNORMAL LOW (ref 8.4–10.5)
Calcium: 8.4 mg/dL (ref 8.4–10.5)
Chloride: 107 mEq/L (ref 96–112)
Chloride: 110 mEq/L (ref 96–112)
Chloride: 112 mEq/L (ref 96–112)
Creatinine, Ser: 1.46 mg/dL (ref 0.4–1.5)
GFR calc Af Amer: 49 mL/min — ABNORMAL LOW (ref >60)
GFR calc Af Amer: 57 mL/min — ABNORMAL LOW (ref >60)
GFR calc Af Amer: >60 mL/min (ref >60)
Glucose, Bld: 110 mg/dL — ABNORMAL HIGH (ref 70–99)
Glucose, Bld: 150 mg/dL — ABNORMAL HIGH (ref 70–99)
Potassium: 3.9 mEq/L (ref 3.5–5.1)
Sodium: 137 mEq/L (ref 135–145)
Sodium: 140 mEq/L (ref 135–145)

## 2010-06-13 LAB — URINALYSIS, ROUTINE W REFLEX MICROSCOPIC
Glucose, UA: NEGATIVE mg/dL
Glucose, UA: NEGATIVE mg/dL
Hgb urine dipstick: NEGATIVE
Ketones, ur: NEGATIVE mg/dL
Protein, ur: NEGATIVE mg/dL
pH: 5 (ref 5.0–8.0)
pH: 5.5 (ref 5.0–8.0)

## 2010-06-13 LAB — DIFFERENTIAL
Basophils Absolute: 0 10*3/uL (ref 0.0–0.1)
Basophils Relative: 0 % (ref 0–1)
Basophils Relative: 0 % (ref 0–1)
Eosinophils Absolute: 0.2 10*3/uL (ref 0.0–0.7)
Eosinophils Absolute: 0.3 10*3/uL (ref 0.0–0.7)
Eosinophils Absolute: 0.8 10*3/uL — ABNORMAL HIGH (ref 0.0–0.7)
Lymphocytes Relative: 11 % — ABNORMAL LOW (ref 12–46)
Monocytes Absolute: 0.6 10*3/uL (ref 0.1–1.0)
Monocytes Absolute: 0.7 10*3/uL (ref 0.1–1.0)
Monocytes Relative: 9 % (ref 3–12)
Neutro Abs: 5.4 10*3/uL (ref 1.7–7.7)
Neutro Abs: 6.2 10*3/uL (ref 1.7–7.7)
Neutrophils Relative %: 78 % — ABNORMAL HIGH (ref 43–77)
Neutrophils Relative %: 80 % — ABNORMAL HIGH (ref 43–77)

## 2010-06-13 LAB — GLUCOSE, CAPILLARY
Glucose-Capillary: 103 mg/dL — ABNORMAL HIGH (ref 70–99)
Glucose-Capillary: 110 mg/dL — ABNORMAL HIGH (ref 70–99)
Glucose-Capillary: 111 mg/dL — ABNORMAL HIGH (ref 70–99)
Glucose-Capillary: 113 mg/dL — ABNORMAL HIGH (ref 70–99)
Glucose-Capillary: 119 mg/dL — ABNORMAL HIGH (ref 70–99)
Glucose-Capillary: 132 mg/dL — ABNORMAL HIGH (ref 70–99)
Glucose-Capillary: 159 mg/dL — ABNORMAL HIGH (ref 70–99)
Glucose-Capillary: 162 mg/dL — ABNORMAL HIGH (ref 70–99)
Glucose-Capillary: 169 mg/dL — ABNORMAL HIGH (ref 70–99)
Glucose-Capillary: 86 mg/dL (ref 70–99)
Glucose-Capillary: 90 mg/dL (ref 70–99)

## 2010-06-13 LAB — VITAMIN B12: Vitamin B-12: 434 pg/mL (ref 211–911)

## 2010-06-13 LAB — URINE MICROSCOPIC-ADD ON

## 2010-06-13 LAB — TSH: TSH: 2.457 u[IU]/mL (ref 0.350–4.500)

## 2010-06-13 LAB — RPR: RPR Ser Ql: NONREACTIVE

## 2010-07-02 LAB — CBC
HCT: 33.3 % — ABNORMAL LOW (ref 39.0–52.0)
Hemoglobin: 11.3 g/dL — ABNORMAL LOW (ref 13.0–17.0)
MCHC: 33.9 g/dL (ref 30.0–36.0)
MCV: 89.4 fL (ref 78.0–100.0)
RBC: 3.72 MIL/uL — ABNORMAL LOW (ref 4.22–5.81)

## 2010-07-02 LAB — DIFFERENTIAL
Basophils Relative: 0 % (ref 0–1)
Eosinophils Relative: 6 % — ABNORMAL HIGH (ref 0–5)
Monocytes Absolute: 0.5 10*3/uL (ref 0.1–1.0)
Monocytes Relative: 9 % (ref 3–12)
Neutro Abs: 3.9 10*3/uL (ref 1.7–7.7)

## 2010-07-02 LAB — BASIC METABOLIC PANEL
CO2: 25 mEq/L (ref 19–32)
CO2: 25 mEq/L (ref 19–32)
CO2: 26 mEq/L (ref 19–32)
Calcium: 8.6 mg/dL (ref 8.4–10.5)
Calcium: 8.8 mg/dL (ref 8.4–10.5)
Chloride: 108 mEq/L (ref 96–112)
Chloride: 110 mEq/L (ref 96–112)
GFR calc Af Amer: 49 mL/min — ABNORMAL LOW (ref >60)
GFR calc non Af Amer: 41 mL/min — ABNORMAL LOW (ref >60)
Glucose, Bld: 95 mg/dL (ref 70–99)
Glucose, Bld: 98 mg/dL (ref 70–99)
Potassium: 3.9 mEq/L (ref 3.5–5.1)
Potassium: 4.6 mEq/L (ref 3.5–5.1)
Sodium: 139 mEq/L (ref 135–145)
Sodium: 142 mEq/L (ref 135–145)
Sodium: 142 mEq/L (ref 135–145)

## 2010-07-02 LAB — URINALYSIS, ROUTINE W REFLEX MICROSCOPIC
Ketones, ur: NEGATIVE mg/dL
Nitrite: NEGATIVE
Protein, ur: NEGATIVE mg/dL
Urobilinogen, UA: 1 mg/dL (ref 0.0–1.0)

## 2010-07-02 LAB — GLUCOSE, CAPILLARY
Glucose-Capillary: 146 mg/dL — ABNORMAL HIGH (ref 70–99)
Glucose-Capillary: 151 mg/dL — ABNORMAL HIGH (ref 70–99)

## 2010-07-02 LAB — APTT: aPTT: 48 seconds — ABNORMAL HIGH (ref 24–37)

## 2010-07-02 LAB — CK TOTAL AND CKMB (NOT AT ARMC): Total CK: 56 U/L (ref 7–232)

## 2010-07-02 LAB — POCT CARDIAC MARKERS
CKMB, poc: 2 ng/mL (ref 1.0–8.0)
Troponin i, poc: <0.05 ng/mL (ref 0.00–0.09)

## 2010-07-02 LAB — URINE CULTURE

## 2010-08-07 NOTE — Assessment & Plan Note (Signed)
[facility]                          CHRONIC HEART FAILURE NOTE   [name], [name]                         MRN:          [id number]  DATE:11/13/2006                            DOB:          May 30, 1919    [name] returns today for followup.  His daughter called the office  earlier today stating her father was coughing.  She was concerned that  it was his heart failure and asked if I would work him into the schedule  to be seen today.  I did, and [name] is here complaining of some  intermittent cough that is nonproductive, denies any fever or chills.  Actually, the coughing began after he just had a central air  conditioning system installed in his house.  Previously was sleeping in  a bedroom that did not have air conditioning (he had a window unit in  the room next to his bedroom).  Now he is sleeping in a room with  central air on.  He thinks this is causing him to cough as he is not  accustomed to the cool air when he sleeps.  He denies any orthopnea or  PND, no peripheral edema, otherwise states he feels just fine.  Has  tolerated medications without any problems.  No chest discomfort, no  palpitations, lightheadedness, or dizziness.  Only complaint is he  states he has difficulty sleeping at night because of the cough.   PAST MEDICAL HISTORY:  1. Congestive heart failure secondary to nonischemic cardiomyopathy      with an ejection fraction, currently, 40% to 45%.  2. Remote history of cardiac catheterization, status post balloon      procedure to a blood vessel in 2004 per patient's report, had an      old documentation that states that the patient had an occluded left      anterior descending, status post PTCA with 50% residual.  3. History of poorly controlled hypertension.  4. Hyperthyroidism.  5. Diabetes, diet controlled.  6. History of depression.  7. B12 deficiency.  8. History of bradycardia, status post permanent pacemaker     implantation by Dr. Armanda Magic in the past.  9. Dyslipidemia.  10.Status post cholecystectomy  11.Status post appendectomy.  12.Status post implantation of a CardioMEMS pulmonary artery sensor      device in June of 2008 via right heart catheterization.   REVIEW OF SYSTEMS:  As stated above.   CURRENT MEDICATIONS:  1. Coreg 6.25 mg b.i.d.  2. Aspirin 81.  3. Vitamin B12 monthly.  4. Multivitamin daily.  5. Zoloft 50 daily.  6. Plavix 75 daily.  7. K-Dur 20 mEq b.i.d.  8. Lasix 40 b.i.d.  9. Synthroid 75 mcg daily.  10.Zocor 20 daily.  11.Lisinopril 20 daily.   PHYSICAL EXAMINATION:  Weight 193, weight is up 2 pounds from July  visit.  Blood pressure 120/70 with a heart rate of 75.  [name] is in no acute distress.  He is his usual pleasant self.  No jugular vein distension at 45 degree angle.  LUNGS:  Clear to  auscultation throughout.  I do not hear any crackles,  wheezing, or rhonchi.  CARDIOVASCULAR EXAM:  Reveals an S1 and S2.  Regular rate and rhythm.  ABDOMEN:  Soft, nontender, positive bowel sounds.  LOWER EXTREMITIES:  Without cyanosis, clubbing, or edema.  NEUROLOGIC:  He is alert and oriented x3.   IMPRESSION:  Congestive heart failure with no signs of volume overload  at this time.  I do not feel cough is related to his heart failure;  however, I have asked daughter to continue to monitor closely.  If he  develops any fever, chills, productive cough, or weight increases or  shortness of breath, she needs to call me.  She knows she can reach me  by cell phone.  Otherwise, we will continue current medications, and I  am going to have her increase his Coreg to 9.375 mg b.i.d.  I have given  them written instructions confirming this.   PRIMARY CARE PHYSICIAN:  Dr. [name].   PRIMARY CARDIOLOGIST:  Dr. [name].      Dorian Pod, ACNP  Electronically Signed      [name], MD  Electronically Signed   MB/MedQ  DD: 11/13/2006   DT: 11/14/2006  Job #: [id number]   cc:   [name], M.D.

## 2010-08-07 NOTE — H&P (Signed)
NAME:  [name], [name] NO.:  [id number]   MEDICAL RECORD NO.:  [id number]          PATIENT TYPE:  INP   LOCATION:  4735                         FACILITY:  [facility]   PHYSICIAN:  Veverly Fells. Excell Seltzer, MD  DATE OF BIRTH:  1919/04/21   DATE OF ADMISSION:  01/21/2007  DATE OF DISCHARGE:                              HISTORY & PHYSICAL   PRIMARY CARDIOLOGIST:  [name], M.D.   PRIMARY CARE PHYSICIAN:  [name], M.D.   HISTORY OF PRESENT ILLNESS:  This is an 77 year old Caucasian male well-  known to our practice with history of nonischemic cardiomyopathy, also  CAD status post PTCA to the left anterior descending artery, poorly  controlled hypertension, diabetes, bradycardia status post pacemaker,  and dyslipidemia.  The patient also has a past medical history of mixed  CHF diastolic and systolic and has been followed closely by Dorian Pod, ACNP at [facility] and does have  implantation of a CardioMEMS pulmonary artery sensor, which was placed  in June 2008, via the right heart cath.  The patient presents today  after 2 days of lower extremity edema, abdominal distention, and at the  concern of his daughter who looks after him, she felt he needed to come  to the emergency room.  The patient denies any chest pain, dyspnea,  diaphoresis, dizziness.  He did complain of some mild fullness in his  abdomen.  He denies any medical noncompliance.  He denies any PND or  orthopnea, but he normally sleeps on 2 pillows and has been for years.  The patient is very hard of hearing, making the interview very  difficult; however, he is of good mentation and is able to answer the  questions appropriately.   REVIEW OF SYSTEMS:  Positive for lower extremity edema, abdominal  distention, and generalized fatigue.  He is able to walk without  difficulty.   PAST MEDICAL HISTORY:  Includes:  1. Nonischemic cardiomyopathy with an EF of 45%.  2.  CAD status post balloon PTCA of the LAD with 50% residual.  3. Poorly controlled hypertension.  4. Hyperthyroidism.  5. Diabetes, diet controlled.  6. Depression.  7. B12 deficiency.  8. Bradycardia status post dual chamber pacemaker placed by Dr. Armanda Magic of [facility].  9. Dyslipidemia.  10.The patient has implantation of a CardioMEMS pulmonary artery      sensor on June 2008, via the right heart cath.   PAST SURGICAL HISTORY:  1. Cholecystectomy.  2. Appendectomy.   SOCIAL HISTORY:  He lives in [place] and lives alone.  He has a  daughter who is very attentive.  He has a past medical history of 35-  pack year of smoking, denies use of alcohol.  No drug use.   FAMILY HISTORY:  Not significant secondary to age and diagnoses.   CURRENT MEDICATIONS:  At home:  1. Coreg 6.25 mg b.i.d.  2. Aspirin 81 mg once a day.  3. Vitamin B12 monthly.  4. Multivitamin daily.  5. Zoloft 50 mg daily.  6.  Plavix 75 mg daily.  7. K-Dur 20 mEq b.i.d.  8. Lasix 40 mg b.i.d.  9. Synthroid 75 mcg daily.  10.Zocor 20 mg daily.  11.Lisinopril 20 mg daily.   ALLERGIES:  CODEINE, PHENERGAN, AND ATIVAN.   CURRENT LABORATORY DATA:  Sodium 137, potassium 4.0, chloride 103, CO2  46, BUN 20, creatinine 1.4, glucose 241.  Hemoglobin 13.3, hematocrit  39.0.   Chest x-ray revealing COPD, cardiac enlargement with mild pulmonary  vascular congestion and a pacemaker.   PHYSICAL EXAMINATION:  VITAL SIGNS:  Blood pressure 132/74, pulse 73,  respirations 18, temperature 97.3.  The patient's weight on last  [facility] evaluation 193 pounds.  He is at 95% on  room air.  HEENT:  Head is normocephalic and atraumatic.  Eyes:  Pupils equal,  round, reactive to light and accommodation.  The patient is edentulous.  NECK:  Supple.  There are no carotid bruits.  Positive JVD to the  mandible.  CARDIOVASCULAR:  Distant heart sounds.  Regular rate and rhythm.  Pulses  are 2+  and equal radially.  Difficult to palpate on the lower  extremities.  LUNGS:  Essentially clear to auscultation.  I am not hearing any  wheezes, rales, or rhonchi.  SKIN:  There is scaly, dry skin to the lower extremities.  ABDOMEN:  Distended, nontender, with 2+ bowel sounds.  EXTREMITIES:  With 1+ to 2+ pitting edema.  Difficult to feel distal  pulses.  He does have some venous stasis skin changes.  NEURO:  Cranial nerves II-XII are grossly intact with the exception of  very hard of hearing.   IMPRESSION:  1. Acute on chronic mixed congestive heart failure.  2. Hypertension.  3. Diabetes.  4. Dyslipidemia.   PLAN:  This is an 77 year old Caucasian male patient well-known to Dr.  Arvilla Meres with [facility] with mixed  nonischemic cardiomyopathy with 2 days of increasing lower extremity  edema, abdominal distention, and fullness.  Assessment reveals a mildly  distended abdomen 1+ to 2+ pitting edema in the lower extremities with  JVD.  The patient has been seen and examined by Dr. Veverly Fells. Cooper  and myself.  The patient will be admitted for diuresis with Lasix IV 40  mg t.i.d. and to continue his other medications as listed.  The patient  will also be on DVT prophylaxis.  The patient will be followed closely  by Dr. [name].  Would recommend considering aldactone in this patient  and also reviewing CardioMEMS data to assist in medication  adjustment if necessary.  This has been explained to the patient and  verbalizes understanding and is willing to be admitted.  We will follow  closely with strict I&O and daily weights to assess patient's response  to IV diuretics and make further recommendations based upon hospital  course and response to treatment.      Bettey Mare. Lyman Bishop, NP      Veverly Fells. Excell Seltzer, MD  Electronically Signed    KML/MEDQ  D:  01/21/2007  T:  01/22/2007  Job:  [id number]   cc:   [name], M.D.

## 2010-08-07 NOTE — H&P (Signed)
NAME:  [name], EASTRIDGE NO.:  [id number]   MEDICAL RECORD NO.:  [id number]          PATIENT TYPE:  INP   LOCATION:  3713                         FACILITY:  [facility]   PHYSICIAN:  Kela Millin, M.D.DATE OF BIRTH:  12/14/1919   DATE OF ADMISSION:  12/20/2007  DATE OF DISCHARGE:                              HISTORY & PHYSICAL   PRIMARY CARE PHYSICIAN:  Dr. [name].   CHIEF COMPLAINT:  Fevers and status post fall.   HISTORY OF PRESENT ILLNESS:  The patient is an 77 year old white male  with past medical history significant for ischemic cardiomyopathy/CHF;  his last ejection fraction was 40% to 45% per 2-D echocardiogram in  2008; coronary artery disease and status post PTCA in 2004, history of  bradycardia and status post pacemaker, hypertension, hypothyroidism,  diet-controlled diabetes (on no medications), depression, vitamin B12  deficiency, hyperlipidemia, status post CardioNet Pulmonary Artery  Sensor Monitor in 2008 who presents with the above complaints.  His  daughter is at the bedside assisting with the history and states that  the patient just slid off the bed onto the floor and was unable to get  up by himself, and she could not get him up either.  She reports that he  has had fever for the past 4 days as well as decreased p.o. intake.  He  has also been incontinent of urine and so has to wear diapers.  He  admits to a mild nonproductive cough.  Per his daughter, he appeared to  be complaining of headaches because he was holding onto his head.  When  asked, he denies headaches.  He admits to dysuria.  Per daughter, he has  had decreased p.o. intake in the past week.  She is concerned about his  living by himself at this time and would like to look into possible  placement.  The patient denies diarrhea, melena, nausea, vomiting, chest  pain, and hematochezia.   He was seen in the emergency room, and a CT scan of his head was done  with no acute  intracranial abnormality, stable chronic ventriculomegaly,  chronic small-vessel disease noted.  His urine showed large leukocyte  esterase with 21-50 WBCs and many bacteria.  His sed rate was elevated  at 104, and x-ray of his hip showed no acute findings, left hip  arthroplasty and osteopenia noted.  He is admitted for further  evaluation and management.   PAST MEDICAL HISTORY:  1. As stated above.  2. History of depression/anxiety.   MEDICATIONS:  1. Plavix 75 mg p.o. daily.  2. Synthroid 100 mcg daily.  3. Lisinopril 10 mg daily.  4. Xanax 0.5 mg p.o. b.i.d.  5. Lasix 120 mg p.o. q.a.m. and 80 mg q.p.m.  6. Coreg 6.25 mg p.o. b.i.d.  7. Zocor 40 mg daily.  8. Multivitamins 1 p.o. daily.  9. Aspirin 81 mg daily.  10.Zoloft 25 mg p.o. daily.  11.KC-cl.  12.Nitroglycerin 0.4 mg p.r.n.   ALLERGIES:  ATIVAN, CODEINE, AND PHENERGAN.   SOCIAL HISTORY:  He chews tobacco; he denies alcohol.   FAMILY  HISTORY:  Positive for heart disease.   REVIEW OF SYSTEMS:  As per HPI; other review of systems negative.   PHYSICAL EXAM:  IN GENERAL:  The patient is an elderly white male; he is  hard of hearing, in no apparent distress.  VITAL SIGNS:  His temperature is 97.3 with a blood pressure of 109/63.  His pulse is 83, O2 saturation of 97%.  HEENT:  PERRL, EOMI, dry mucous membranes, no oral exudates.  No  temporal artery tenderness.  NECK:  Supple, no adenopathy, no thyromegaly and no JVD.  LUNGS:  Decreased breath sounds at the bases, otherwise clear to  auscultation.  CARDIOVASCULAR:  Regular rate and rhythm.  Normal S1, S2.  ABDOMEN:  Soft, bowel sounds present, nontender, nondistended.  No  organomegaly and no masses palpable.  EXTREMITIES:  No cyanosis and no edema.  NEURO:  Cranial nerves II-XII are grossly intact.  He moves all  extremities grossly and follows simple commands.   LABORATORY DATA:  As per HPI.  Also, his white cell count is 6.7 with a  hemoglobin of 13.4,  hematocrit of 39.6, platelet count of 183.  His  neutrophil count is 84, brain natriuretic peptide 423.  Sodium is 133,  potassium 3.3, chloride 97, CO2 of 25, glucose 361, BUN 58, creatinine  2.56.  His calcium is 8.3, total protein 6.4, albumin 2.6.   ASSESSMENT AND PLAN:  1. Urinary tract infection:  Will obtain urine and blood cultures;      place on empiric antibiotics.  Will follow and recheck a sed rate      after infection treated.  2. Acute renal failure:  Per E-chart records, his last creatinine in      January 2009 was 1.23.  Will hold Lasix and ACE inhibitor for now,      cautiously hydrate; follow recheck and further manage as      appropriate.  3. Uncontrolled diabetes:  Will cover with sliding scale insulin for      now; check a hemoglobin A1c.  Monitor Accu-Cheks and follow.  4. Ischemic cardiomyopathy/congestive heart failure:  Last ejection      fraction 40% to 45% as discussed above, holding Lasix and ACE      secondary to number two; will monitor fluid status and manage as      appropriate.  Continue Coreg.  5. Hypothyroidism:  Continue Synthroid.  6. History of coronary artery disease and status post percutaneous      transluminal coronary angioplasty in 2004:  Patient is chest pain      free; continue outpatient medications.  7. History of bradycardia:  Status post pacemaker.  8. Hypertension.  9. Status post CardioNet Pulmonary Artery Sensor Monitor in 2008.  10.History of vitamin B12 deficiency:  Will continue multivitamins.  11.Status post fall:  X-rays as above; will treat above underlying      conditions; consult physical therapy/occupational therapy; follow      social work consult as well.      Kela Millin, M.D.  Electronically Signed     ACV/MEDQ  D:  12/21/2007  T:  12/21/2007  Job:  [id number]   cc:   [name], M.D.  [facility]

## 2010-08-07 NOTE — Discharge Summary (Signed)
NAME:  BRADAN, CONGROVE NO.:  [id number]   MEDICAL RECORD NO.:  [id number]          PATIENT TYPE:  INP   LOCATION:  3702                         FACILITY:  [facility]   PHYSICIAN:  Nicolasa Ducking, ANP DATE OF BIRTH:  May 30, 1919   DATE OF ADMISSION:  03/24/2007  DATE OF DISCHARGE:  03/26/2007                               DISCHARGE SUMMARY   PRIMARY CARDIOLOGIST:  Dr. Arvilla Meres.  She is also followed in  [facility] by Dorian Pod, nurse practitioner.   DISCHARGE DIAGNOSIS:  Acute on chronic systolic congestive heart  failure.   SECONDARY DIAGNOSES:  1. Ischemic cardiomyopathy.  2. Coronary artery disease status post percutaneous transluminal      coronary angioplasty of the left anterior descending in 2004.  3. History of bradycardia status post permanent pacemaker placement by      Dr. Armanda Magic.  4. Hypertension.  5. Hypothyroidism.  6. Diet-controlled diabetes.  7. Depression.  8. B12 deficiency.  9. Hyperlipidemia.  10.Status post CardioNet pulmonary artery sensor monitor placement in      2008.   ALLERGIES:  Codeine, Phenergan and Ativan.   PROCEDURES:  None.   HISTORY OF PRESENT ILLNESS:  An 77 year old male with prior history of  CAD, ischemic cardiomyopathy and chronic systolic CHF.  He was seen in  [facility] on March 24, 2007 with complaints of dyspnea on  exertion, abdominal fullness, lower extremity edema and weeping.  For  these reasons he was admitted to [facility] for IV diuresis.   HOSPITAL COURSE:  The patient was placed on IV furosemide at 80 mg  b.i.d. with brisk diuresis.  His weight has come down nicely from  admission weight of 92.6 kg down to 88.5 kg.  He has had a total net  negative of approximately 1 liter during this admission.  He has  symptomatically improved with decreased edema and is being discharged  home today in satisfactory condition.   DISCHARGE LABORATORIES:   Hemoglobin 13.4, hematocrit 39.2, WBC 4.9,  platelets 177.  Sodium 138, potassium 4.1, chloride 99, CO2 33, BUN 17,  creatinine 1.23, glucose 211.  Calcium 9.1, total bilirubin 0.9,  alkaline phosphatase 67, AST 20, ALT 17, total protein 0.9, albumin 4.0,  hemoglobin A1c 7.6.  Admission BNP 206.0.  TSH 4.287.   DISPOSITION:  The patient is being discharged home today in good  condition.   FOLLOW-UP APPOINTMENTS:  The patient is to follow up with a Dorian Pod in [facility] on April 16, 2007 at  2:30 a.m.  he is advised to restrict salt in his diet and not add salt  to foods, as well as weigh himself daily and report weight gains of  greater than 2 pounds in 1 day or 5 pounds in 1 week.  He is also asked  to obtain a primary care Luara Faye and follow up secondary to history of  hyperglycemia, what he considers to be diet-controlled diabetes.   DISCHARGE MEDICATIONS:  1. Lasix 120 mg q.a.m., 80 mg q.p.m.  2. Lisinopril 10 mg  daily.  3. Synthroid 100 mcg daily.  4. Coreg 6.25 mg b.i.d.  5. Zoloft 50 mg daily.  6. K-Dur 20 mEq b.i.d.  7. Zocor 20 mg daily.  8. Multivitamin one daily.  9. Aspirin 81 mg daily.  10.Plavix 75 mg daily.  11.Xanax 0.25 mg b.i.d.  12.Nitroglycerin 0.4 mg sublingual p.r.n. chest pain.   OUTSTANDING LABORATORY STUDIES:  None.   DURATION OF DISCHARGE ENCOUNTER:  45 minutes including physician time.      Nicolasa Ducking, ANP     CB/MEDQ  D:  03/26/2007  T:  03/26/2007  Job:  [id number]   cc:   Dorian Pod, ACNP

## 2010-08-07 NOTE — Discharge Summary (Signed)
[name], [name] NO.:  [id number]   MEDICAL RECORD NO.:  [id number]          PATIENT TYPE:  INP   LOCATION:  4708                         FACILITY:  [facility]   PHYSICIAN:  [name], MDDATE OF BIRTH:  [birth year]   DATE OF ADMISSION:  09/12/2008  DATE OF DISCHARGE:  09/14/2008                               DISCHARGE SUMMARY   PRIMARY CARDIOLOGIST:  Dr. Bevelyn Buckles. Taylor.   DISCHARGE DIAGNOSIS:  Acute on chronic mixed systolic/diastolic  congestive heart failure.   SECONDARY DIAGNOSES:  1. Coronary artery disease.  2. Ischemic cardiomyopathy.  3. History bradycardia, status post [employer] Sigma permanent      pacemaker.  4. Hypertension.  5. Diabetes mellitus.  6. Hyperlipidemia.  7. Obesity.  8. Hypothyroidism.  9. B12 deficiency.  10.Depression.  11.Mild aortic stenosis.  12.Questionable dementia.   ALLERGIES:  CODEINE, PHENERGAN, ATIVAN.   PROCEDURES:  2-D echocardiogram performed September 13, 2008, revealing an EF  of  40-45% with mild aortic stenosis and mild tricuspid regurgitation.  CT of the head without contrast, performed June 21, showed stable age-  related cerebral atrophy, ventriculomegaly and periventricular white  matter disease.  No acute intracranial findings or mass lesions.   HISTORY OF PRESENT ILLNESS:  An 77 year old Caucasian male with the  above problem list.  The patient lives in a skilled nursing facility and  had been experiencing nausea and vomiting, occurring intermittently over  the past week.  He has been experiencing moderate fatigue, as well as  weakness with a history of a fall.  The patient was felt to be  hypotensive on the day of admission and his daughter felt that he was  not quite right and called our office and recommendation was made to  bring the patient to the ER.  In the ER, he was mildly dyspneic and  confused, but cooperative.  He was felt to exhibit volume overload on  exam and his chest x-ray  did in fact show a mildly progressive  cardiomegaly and interval mild changes of CHF.  The patient was admitted  for further evaluation and management of acute on chronic mixed systolic  and diastolic congestive heart failure.   HOSPITAL COURSE:  The patient was aggressively diuresed with reduction  in weight from 86.8 kg on admission to 80.604 kg on discharge.  Intakes  and outputs were not accurately recorded.  With volume reduction, he has  had significant symptomatic improvement.  This morning he did bump his  creatinine from a baseline of about 1.6 up to 1.84 with a BUN of 33 and  a bicarb of 25, which was stable.  We have discontinued his IV Lasix and  will drop him down to Lasix 40 mg p.o. daily.  We have arranged for him  to follow up in our office next Friday, July 2, and at that time we will  check a BMET and a BNP.  The patient will be discharged back to skilled  nursing facility today in good condition.   DISCHARGE LABS:  Hemoglobin 11.3, hematocrit 33.3, WBC 5.4, platelets  190, INR 1.1.  Sodium 142, potassium 4.2, chloride 105, CO2 25, BUN 33,  creatinine 1.84, glucose 95, calcium 8.8, CK 56, MB 2.1, troponin I  0.03.  BNP 389, down from 726.  TSH 2.035.  Vitamin B12 205.  Urinalysis  was negative.  Urine culture showed no growth.   DISPOSITION:  The patient will be discharged home today in good  condition.   FOLLOW-UP APPOINTMENTS:  We have arranged for follow-up with Dr.  Prescott Gum nurse practitioner on July 2 at 4:00 p.m.  We will check a  BMET and a BNP at that time.   DISCHARGE MEDICATIONS:  1. Zoloft 25 mg daily.  2. Amaryl 4 mg daily.  3. Reglan 5 mg daily.  4. Simvastatin 20 mg q.h.s.  5. Plavix 75 mg daily.  6. Multivitamin one daily.  7. Aspirin 81 mg daily.  8. Synthroid 100 mcg daily.  9. Coreg 6.25 mg b.i.d.  10.Hydralazine 10 mg t.i.d.  11.Imdur 30 mg daily.  12.Lasix 40 mg daily.  13.Xanax 0.5 mg b.i.d.  14.Nitroglycerin 0.4 mg sublingual  p.r.n. chest pain.   On follow-up, we will consider initiation of p.r.n. metolazone, if this  is felt to be needed at follow-up.  This has not been initiated at this  time.   OUTSTANDING LAB STUDIES:  BMET and BNP, scheduled for Friday, July 2.   Duration discharge encounter 45 minutes, including physician time.      [name], ANP      [name] Buckles. Bensimhon, MD  Electronically Signed    CB/MEDQ  D:  09/14/2008  T:  09/14/2008  Job:  [id number]   cc:   [facility]

## 2010-08-07 NOTE — Discharge Summary (Signed)
[name], PALMATIER NO.:  [id number]   MEDICAL RECORD NO.:  [id number]          PATIENT TYPE:  INP   LOCATION:  4734                         FACILITY:  [facility]   PHYSICIAN:  Kela Millin, M.D.DATE OF BIRTH:  10/09/1919   DATE OF ADMISSION:  09/10/2006  DATE OF DISCHARGE:  09/13/2006                               DISCHARGE SUMMARY   DISCHARGE DIAGNOSES:  1. Congestive heart failure exacerbation, acute-on-chronic mixed      systolic/diastolic congestive heart failure.  2. Dyslipidemia.  3. Uncontrolled hypertension.  4. Coronary artery disease, status post pacemaker in 2004.  5. Hypothyroidism.  6. Depression.  7. History of vitamin B12 deficiency.  8. Hypokalemia, resolved.  9. History of diet-controlled diabetes mellitus.   CONSULTATIONS:  Cardiology.   PROCEDURES/STUDIES:  A 2D echocardiogram, overall left ventricular  systolic function moderately decreased.  Ejection fraction 40-45%.  Moderate left diffuse ventricular hypokinesis.  Doppler parameters are  consistent with normal left ventricular relaxation.   HISTORY:  The patient is an 77 year old white male with the above-listed  medical problems, who presented with complaints of worsening leg  swelling.  He reported that this had progressed over the 2-3 days prior  to admission, and the home health RN reported weight gain.  The patient  admitted to a cough productive of whitish sputum and that he had been  sleeping in a recliner instead of his usual two pillows.  Per family,  the patient had appeared short of breath, although he denied any  difficulty in breathing.  The patient denied chest pain, fevers,  dysuria, melena, hematochezia.  Also denied diarrhea.  In the ER, he had  a chest x-ray which showed cardiomegaly and mild edema.  His B-type  natriuretic peptide was elevated, and his point-of-care markers were  negative.   PHYSICAL EXAMINATION:  VITAL SIGNS:  Temperature 98.3, blood  pressure  177/81, pulse 72, respiratory rate 14, O2 sat 96%.  GENERAL:  He was pleasant, hard of hearing, in no respiratory distress.  LUNGS:  Decreased breath sounds in the bases with a few bibasilar  crackles.  EXTREMITIES:  Edema +2, extending to just below his knees.  No cyanosis.   LABORATORY DATA:  B-type natriuretic peptide 953.  Point-of-care markers  are negative.  He has a sodium of 140 with a potassium of 3.3, chloride  102, BUN 10, glucose 117.  The pH 7.35, creatinine 1.2.  White cell  count 4.3, hemoglobin 10.9, hematocrit 31.5, platelet count 151,  neutrophil count 68%.   HOSPITAL COURSE:  1. Congestive heart failure exacerbation:  Mixed systolic and      diastolic heart failure.  Upon admission, the patient was started      on IV diuresis with Lasix.  Serial cardiac enzymes were ordered,      and these were negative for MI.  A 2D echocardiogram was done, and      the results are as stated above.  Cardiology was consulted to see      patient, as he stated that he wanted a new cardiologist, and per  family, he had been in the hospital about every other month with      CHF symptoms.  The patient was educated on a low salt diet.      Cardiology saw the patient and adjusted his Lasix as appropriate      for adequate diuresis.  A beta blocker (Coreg) was also added.  His      lower extremity edema resolved.  He did not have any shortness of      breath throughout his hospital stay.  A fasting lipid profile was      ordered, per cardiology, and his total cholesterol was within      normal limits at 189, but his LDL cholesterol elevated at 132 with      an HDL of 24.  The patient was started on Zocor, which he is to      continue upon discharge.  The patient's creatinine diuresis was      noted to increase from 1.26 to 1.67, so the IV Lasix was      discontinued, and he is being discharged on oral Lasix at a dose of      40 mg b.i.d.  Dr. [name] with [facility]  also indicated      that the patient is a candidate for a cardiac implant, and he is      scheduled to have this done as an outpatient on September 18, 2006.  He      is to follow up at the CHF clinic in 1-2 weeks.   1. Uncontrolled hypertension:  The patient's blood pressures were      noted to be elevated upon admission with diuresis.  His blood      pressures improved.  He is to continue his ACE inhibitor and the      Coreg was added, as above.   1. Hypothyroidism:  The patient had a TSH done upon admission.  It was      noted to be elevated at 7.54.  The patient's Synthroid was thus      increased accordingly to 75 mcg.  He is to continue this dose upon      discharge and follow up with his primary care physician for recheck      of a TSH level in 4-6 weeks.   1. Probable hearing impairment:  Patient is to follow up with an ENT      outpatient upon discharge.   1. Dyslipidemia:  As above.  The patient is to continue Zocor upon      discharge.   1. History of diabetes, diet controlled.  He had a hemoglobin A1C      done, and this was 6.5.  He is to continue the diabetic diet upon      discharge.   1. Hypokalemia:  The patient's potassium was replaced in the hospital.      His last potassium today prior to discharge is 4.2.   DISCHARGE MEDICATIONS:  1. Coreg 3.125 mg b.i.d.  2. Lisinopril 20 mg p.o. daily.  3. Zocor 20 mg p.o. nightly.  4. Synthroid, changed to 75 mcg daily.  5. Lasix, change to 40 mg daily.  6. KCL, change to 20 mEq b.i.d.  7. Patient is to continue Plavix, Xanax, Zoloft, multivitamins, and      vitamin B12 injection outpatient.  8. Patient is instructed to discontinue Benicar.   FOLLOW-UP CARE:  1. Dr. [name], [facility], on September 18, 2006 for cardiac  implant.  2. CHF clinic, [place], in 1-2 weeks.  3. [name] in one week, patient to call for appointment.   DISCHARGE CONDITION:  Improved, stable.      Kela Millin, M.D.   Electronically Signed     ACV/MEDQ  D:  09/13/2006  T:  09/13/2006  Job:  [id number]   cc:   [name], M.D.  [name], MD

## 2010-08-07 NOTE — Discharge Summary (Signed)
NAME:  [name], [name] NO.:  [id number]   MEDICAL RECORD NO.:  [id number]          PATIENT TYPE:  INP   LOCATION:  6529                         FACILITY:  [facility]   PHYSICIAN:  [name], MDDATE OF BIRTH:  December 12, 1919   DATE OF ADMISSION:  09/18/2006  DATE OF DISCHARGE:  09/19/2006                               DISCHARGE SUMMARY   DISCHARGE DIAGNOSIS:  Chronic mixed systolic and diastolic congestive  heart failure.   SECONDARY DIAGNOSES:  1. Coronary artery disease.  2. Diastolic dysfunction.  3. Hypertension.  4. Hypothyroidism.  5. Type 2 diabetes mellitus.  6. Depression.  7. B12 deficiency.  8. History of bradycardia status post pacemaker placement.  9. Status post cholecystectomy.  10.Status post appendectomy.   ALLERGIES:  CODEINE, PHENERGAN, AND ATIVAN.   PROCEDURES:  Right heart cardiac catheterization with CardioMem  pulmonary artery pressure sensor implantation.   HISTORY OF PRESENT ILLNESS:  77 year old male with the above problem  list who was recently hospitalized approximately 1 week ago for CHF  exacerbation.  At that time he was evaluated by cardiology, and his EF  was noted to be 40-45% with diastolic dysfunction.  The patient was felt  to be a good candidate for the CardioMem trial, and he was subsequently  enrolled.   HOSPITAL COURSE:  Mr. Kissick presented on September 18, 2006 for CardioMem  pulmonary artery pressure sensor placement and right heart cardiac  catheterization.  His PA pressure was 22/9 with a mean of 15, and his  wedge was 4.  Cardiac output was 5.8 with an index of 2.8.  the  CardioMem device was successfully implanted.   Post-procedure, Mr. Gjerde has been ambulating without difficulty.  He  has been counseled and educated by the CardioMem device support team,  and he will be discharged home today in satisfactory condition.   DISCHARGE LABORATORY DATA:  Hemoglobin 11.6, hematocrit 33.6, WBC 4.9,  platelets  190, MCV 89.5.  Sodium 141, potassium 4.1, chloride 105, CO2  29, BUN 27, creatinine 1.43, glucose 143.  PT 14.0, INR 1.1.  Total  cholesterol 189, triglycerides 167, HDL 24, LDL 132, calcium 8.4.   DISPOSITION:  The patient is being discharged home today in good  condition.   FOLLOW UP PLANS AND APPOINTMENTS:  He is to follow up in the [facility]iology Heart Failure Clinic on September 23, 2006 at 2 p.m. with Dorian Pod, nurse practitioner.  He is asked to follow up with [name]  as previously scheduled.   DISCHARGE MEDICATIONS:  1. Coreg 3.125 mg b.i.d.  2. Lisinopril 20 mg daily.  3. Zocor 20 mg q.h.s.  4. Synthroid 75 mcg daily.  5. Lasix 40 mg b.i.d.  6. K-Dur 20 mEq b.i.d.  7. Plavix 75 mg daily x30 days.  8. Zoloft 50 mg daily.  9. Multivitamin one daily.  10.Vitamin B12 injection monthly.  11.Aspirin 81 mg daily.  12.Xanax 0.5 mg b.i.d. p.r.n.  13.Nitroglycerin 0.4 mg sublingual p.r.n. chest pain.   OUTSTANDING LAB STUDIES:  None.   DURATION OF DISCHARGE ENCOUNTER:  35  minutes, including physician time.      Nicolasa Ducking, ANP      [name], MD  Electronically Signed    CB/MEDQ  D:  09/19/2006  T:  09/19/2006  Job:  [id number]   cc:   [name], M.D.

## 2010-08-07 NOTE — Assessment & Plan Note (Signed)
[facility]                            CARDIOLOGY OFFICE NOTE   JEX, STRAUSBAUGH                         MRN:          [id number]  DATE:01/20/2008                            DOB:          1919-08-26    PRIMARY CARDIOLOGIST:  [name], MD   PRIMARY CARE PHYSICIAN:  He does not have one.   This is an 77 year old married white male patient of Dr. Prescott Gum who  was admitted to the hospital from September 27 through October 7 with  Klebsiella pneumoniae UTI and septic shock.  He developed acute renal  failure and uncontrolled diabetes.  He has a history of an ischemic  cardiomyopathy, ejection fraction of 40-45%, and has required high-dose  diuretics as an outpatient.  In the hospital with his renal failure, his  diuretics and lisinopril were stopped.  He was eventually restarted on  his Coreg and since he has been in the nursing home, they restarted low-  dose Lasix at 20 mg daily, but lisinopril is still on hold.  He had a  BMET last week and BUN was 34, creatinine 1.5, potassium 5.1, and BNP  was 149.   Since the patient has been in the nursing home, he is doing quite well  from a cardiac standpoint.  He denies any chest pain, shortness of  breath, dizziness, or presyncope.  His son and daughter are here with  him today and says has made a worldly difference in keeping him well.  He apparently drinks 20 Pepsi a day and sits with the salt shaker by his  side when he is at home.  He has some form of dementia, and they are  hoping they can keep him at the nursing home instead of letting him  return home.   CURRENT MEDICATIONS:  1. Zoloft 25 mg daily.  2. Lasix 20 mg daily.  3. Amaryl 4 mg daily.  4. Reglan 5 mg daily.  5. Zocor 20 mg nightly.  6. Plavix 75 mg daily.  7. Multivitamin daily.  8. B12 injections monthly.  9. Aspirin 81 mg daily.  10.Coreg 6.25 mg b.i.d.  11.Synthroid 100 mcg daily.  12.Xanax p.r.n.   PHYSICAL  EXAMINATION:  GENERAL:  This is a pleasant elderly 77 year old  white male in no acute distress.  VITAL SIGNS:  Blood pressure 134/80, pulse 72, weight 191.  NECK:  Without JVD, HJR bruit, or thyroid enlargement.  LUNGS:  Clear anterior, posterior, and lateral.  HEART:  Regular rate and rhythm at 72 beats per minute.  Normal S1 and  S2 with 1/6 systolic murmur at the left sternal border.  ABDOMEN:  Soft  without organomegaly, masses, lesions, or abnormal tenderness.  EXTREMITIES:  +1 edema bilaterally.  Positive distal pulses.   EKG, AV paced rhythm.   IMPRESSION:  1. Ischemic cardiomyopathy, ejection fraction of 40-45%.  2. Status post pacemaker for complete heart block and sinus node      dysfunction with chronotropic incompetence.  3. Coronary artery disease with prior percutaneous coronary      intervention of the left anterior  descending (coronary artery) in      2004.  4. Recent Klebsiella pneumoniae urinary tract infection and septic      shock.  5. Acute renal failure, resolved.  6. Diabetes mellitus.  7. Hypothyroidism.  8. Hypertension.  9. B12 deficiency.  10.Anxiety depression.  11.Abnormal cardiac enzymes in the hospital felt secondary to      hypotension, infectious process, no evidence of acute coronary      syndrome.   PLAN:  The patient is quite stable.  Because of his leg edema and  increased BNP, I will increase his Lasix to 40 mg daily and have a BMET  and BNP checked next week in the nursing home.  I am reluctant to  restart his lisinopril at this time until his creatinine remains stable.  He will see Dr. [name] back in 1-2 months.      Jacolyn Reedy, PA-C  Electronically Signed      Marca Ancona, MD  Electronically Signed   ML/MedQ  DD: 01/20/2008  DT: 01/21/2008  Job #: [id number]   cc:   [facility]

## 2010-08-07 NOTE — Assessment & Plan Note (Signed)
[facility]                          CHRONIC HEART FAILURE NOTE   DAIVEON, MARKMAN                         MRN:          [id number]  DATE:03/24/2007                            DOB:          1920-01-11    PRIMARY CARDIOLOGIST:  [name], MD.   PRIMARY CARE PHYSICIAN:  None at this time.   Mr. Clinard returns today for follow-up of his congestive heart failure.  I saw him a week ago and found him to be in volume overload.  I  increased his Lasix to 80 mg b.i.d. and checked lab work on the patient.  At that time, I felt patient's fluid volume overload was secondary to  dietary indiscretion as he has eaten seafood from [facility].  He returns today with his daughter.  Patient's fluid volume status has  not changed at all.  Daughter states she could not tell any response to  the additional diuretic therapy.  Mr. Meas complains of dyspnea on  exertion, denies any PND or orthopnea.  He has complained of some  abdominal fullness, lower extremity edema with lower extremity weeping.  Mr. Calvert denies any chest discomfort or lightheadedness, dizziness,  presyncope or syncopal episodes.  States compliance with medications.  He has been elevating his legs as I instructed but cannot really tell  any resolution of lower extremity edema with elevating his feet.   PAST MEDICAL HISTORY:  1. Congestive heart failure secondary to nonischemic cardiomyopathy      with EF currently 40-50%.  2. Remote history of cardiac catheterization status post balloon      procedure in 2004 per patient's report (old documentation states      patient had an occluded LAD and underwent PTCA with 50% residual).  3. Status post placement of a permanent pacemaker by Dr. Armanda Magic      for a  history of bradycardia.  4. History of poorly controlled hypertension.  5. Hypothyroidism.  6. History of diabetes.  Patient states this is diet controlled as he      does  not check his sugars.  7. Depression.  8. B12 deficiency.  9. Dyslipidemia.  10.Status post implantation of a CardioNet pulmonary artery sensor      device in 2008 via right heart cath.   REVIEW OF SYSTEMS:  As stated above.   CURRENT MEDICATIONS:  1. Zocor 20.  2. Lasix 80 b.i.d.  3. K-Dur 20 b.i.d.  4. Plavix 75.  5. Zoloft 75.  6. Multivitamin daily.  7. Vitamin B12 injection once a month.  8. Aspirin 81.  9. Coreg.  10.Lisinopril 10 mg daily.  11.Synthroid 100 mcg daily.  12.Xanax 0.25 mg b.i.d. p.r.n.   PHYSICAL EXAMINATION:  VITAL SIGNS:  Weight 206 pounds, no change from  weight one week ago.  Blood pressure 144/83, heart rate 96.  GENERAL APPEARANCE:  Mr. Gauthreaux is in no acute distress.  NECK:  He has JVD around 10 cm at a 45 degree angle.  LUNGS:  He has fine crackles bilateral bases.  CARDIOVASCULAR:  S1 and S2, slightly  tachycardic.  ABDOMEN:  Soft, nontender, positive bowel sounds, mildly distended.  EXTREMITIES:  Lower extremities with +3 pitting edema bilaterally with  trace weeping to right lower extremity.  NEUROLOGIC:  Alert and oriented x3.   IMPRESSION:  Congestive heart failure secondary to nonischemic  cardiomyopathy with ejection fraction currently 40-45%.  Patient with  poor response to p.o. diuretic therapy.  Will plan on admitting the  patient for 24-hour IV Lasix therapy and monitor renal function.  Will  also check a hemoglobin A1c during hospitalization as patient states he  just controls it by his diet at home but does not actually check his  CBGs.  Patient is agreeable to hospital admission for 24 hours only.      Dorian Pod, ACNP  Electronically Signed      Rollene Rotunda, MD, [facility]  Electronically Signed   MB/MedQ  DD: 03/24/2007  DT: 03/24/2007  Job #: [id number]

## 2010-08-07 NOTE — Assessment & Plan Note (Signed)
[facility]                          CHRONIC HEART FAILURE NOTE   [name], Taylor                         MRN:          [id number]  DATE:10/23/2006                            DOB:          [birth year]    [name] is new to the heart failure clinic.  His primary care  physician is Dr. [name].  Cardiologist is Dr. Nicholes Mango.   [name] is a very pleasant 77 year old Caucasian gentleman with known  history of congestive heart failure secondary to ischemic  cardiomyopahty.  [name] is status post PTCA to the LAD in 2004.  Mr.  [name] is relatively new to [facility].  He apparently has had  heart failure for several years but has been managed by primary care.  We were asked to consult during a recent hospitalization in June  regarding his congestive heart failure and lower extremity swelling.  During that hospitalization, the patient was seen by Dr. Antoine Poche and  Dr. [name].  He agreed to participate in the Cardia MEMS pulmonary  artery sensory device trial and underwent placement of the Cardia MEMS  sensory on September 18, 2006.  Patient apparently did well and was  ultimately diuresed in stable condition and discharged home.  Patient  also underwent a 2D echocardiogram during that admission.  It showed  left ventricular ejection fraction estimated at 40-45% with moderate  diffuse left ventricular hypokinesis.  There was dyssynergic motion of  the intraventricular septum, consistent with a conduction abnormality or  paced rhythm.  Patient was ultimately discharged home on June 21 with  instructions to follow up here, upon discharge.   [name] states he has been doing well since he was discharged home.  He is accompanied by his daughter, [name], who manages the patient's  medications, assists in preparing his meals, and overall care of Mr.  Merced.  [name] apparently lives alone, but there is frequently a  family  member at his home.  The family appears to be very attentive.  Patient ambulates with the assistance of a walker.  He states that he is  pleased with his quality of life at the present time but frustrated  about numerous hospitalizations secondary to his heart failure.  He  seems to have a poor understanding of the significance of his diet in  regards to his heart failure.  In talking with him, he does not drink  any water.  He states that the only thing he drinks are Pepsi and a cup  of coffee and iced tea, but he does not average more than 2 liters of  any fluids daily.  His weakness is salt.  He states that he pretty much  salts everything.  He also reports that when he was younger, it was not  unusual for him to pour salt in his hand and lick it off his hand, that  he likes salt that much.   Patient denies any orthopnea or PND.  He denies any peripheral edema at  this time.  When he does run into fluid volume overload,  he states that  his legs and feet swell and his belly swells, and he gets short of  breath, per patient's report.  He denies any of these symptoms at the  present time.   PAST MEDICAL HISTORY:  1. Congestive heart failure,  secondary to ischemic cardiomyopathy.  2. Remote history of cardiac catheterization.  Patient states that he      had a balloon procedure to a blood vessel in 2004.  Old      documentation states the patient had an occluded LAD.  He underwent      PTCA with 50% residual.  3. History of poorly controlled hypertension.  4. Hypothyroidism.  5. Diabetes, diet controlled.  6. History of depression.  7. B12 deficiency.  8. History of bradycardia, apparently underwent placement of a      permanent pacemaker by Dr. Armanda Magic in the past, per patient's      report.  9. Dyslipidemia.  10.Status post cholecystectomy and appendectomy.  11.Status post cardiac catheterization in June, 2008 (right heart      catheterization) with implantation of a Cardia  MEMS pulmonary      artery pressure sensory device.   REVIEW OF SYSTEMS:  As stated above, otherwise negative.   ALLERGIES:  CODEINE, PHENERGAN, ATIVAN.   CURRENT MEDICATIONS:  1. Coreg 3.125 mg b.i.d.  2. Lisinopril 20 mg daily.  3. Zocor 20 daily.  4. Synthroid 75 mcg.  5. Lasix 40 b.i.d.  6. K-Dur 20 b.i.d.  7. Plavix 75 daily.  8. Zoloft 50 daily.  9. Multivitamins daily.  10.Vitamin B12 injection once a month.  11.Aspirin 81 daily.  12.P.R.N. meds include Xanax and nitroglycerin.   PHYSICAL EXAMINATION:  Weight 191 pounds.  Blood pressure 150/73 and  148/80 in the right arm.  Heart rate 75.  [name] is in no acute distress.  He is a very pleasant gentleman,  slightly hard of hearing.  HEENT:  Unremarkable.  NECK:  No jugular vein distention.  LUNGS:  Clear to auscultation bilaterally.  CARDIOVASCULAR:  An S1 and S2, regular rate and rhythm.  ABDOMEN:  Soft, nontender, positive bowel sounds.  LOWER EXTREMITIES:  Without clubbing, cyanosis or edema.  NEUROLOGIC:  He is alert and oriented x3.   IMPRESSION:  Congestive heart failure/ischemic cardiomyopathy, status  post recent implantation of a Cardia MEMS pulmonary artery sensory  device.  Patient's volume status is stable at this time.  I am going to  have him increase his Coreg to 6.25 mg b.i.d.  Continue his other  medications.  Will check BNP and BMET on patient to obtain baseline.  I  will call his daughter, [name], if there are any changes to make in  patient's medications.  Her cell number is [phone].  Her home number is  [phone].  We have permission to leave any  pertinent information on the answering machine.  The patient verbally  agrees that it is okay to call his daughter and leave information  regarding his health care.      Dorian Pod, ACNP  Electronically Signed      [name], MD  Electronically Signed   MB/MedQ  DD: 10/23/2006  DT: 10/23/2006  Job #: [id number]   cc:    [name], M.D.

## 2010-08-07 NOTE — H&P (Signed)
NAME:  [name], Taylor NO.:  [id number]   MEDICAL RECORD NO.:  [id number]          PATIENT TYPE:  INP   LOCATION:  1843                         FACILITY:  [facility]   PHYSICIAN:  Bevelyn Buckles. Taylor, MDDATE OF BIRTH:  [birth year]   DATE OF ADMISSION:  09/12/2008  DATE OF DISCHARGE:                              HISTORY & PHYSICAL   PRIMARY CARDIOLOGIST:  [name], MD   CHIEF COMPLAINT:  Fatigue/altered mental status.   HISTORY OF PRESENT ILLNESS:  [name] is an 77 year old Caucasian male  with known CAD (S/P PTCA of the LAD with residual 30% stenosis), mixed  cardiomyopathy with EF equal to 40-45%, history of bradycardia (S/P  Medtronic Sigma PPM and CardioMEMS pulmonary artery sensor), poorly  controlled hypertension, diabetes mellitus (diet controlled),  dyslipidemia, B12 deficiency, hypothyroidism, depression, and S/P  Klebsiella UTI and septic shock September 2009, presenting with acute on  chronic mixed CHF symptoms as well as altered mental status.  The  patient reports having no nausea and vomiting x3 episodes last week and  having been feeling moderately fatigued over the last couple of weeks.  The patient also reports possibly three episodes of falls recently with  mild trauma to his left upper extremity.  The patient denies any head  trauma.  His daughter reports that he just does not seem like himself  over the last 2+ weeks.  Today, she was informed by the skilled nursing  facility where he resides that his blood pressure was in the 90s/50s.  The patient's daughter is highly vigilant regarding his BP after his  episode of septic shock last year and therefore, called [facility] request in counseled.  Per conversation with [name], the patient was presented to the ED for evaluation.  At the  ED, the patient was normotensive with normal heart rate, afebrile and  mildly short of breath with mild confusion.   PAST MEDICAL  HISTORY:  1. CAD, as above, S/P cardiac catheterization 2004.  2. Mixed cardiomyopathy, systolic/diastolic HF.      a.     S/P CardioMEMS pulmonary artery sensor.  3. History of bradycardia, S/P Medtronic Sigma PPM.  4. Hypertension, history of poor control.  5. Diabetes mellitus, diet controlled.  6. Hyperlipidemia.  7. Obesity.  8. Hypothyroidism.  9. B12 deficiency.  10.Depression.  11.Mild aortic stenosis.  12.Questionable dementia.   SOCIAL HISTORY:  The patient lives in a skilled nursing facility, has a  remote 30-pack-year smoking history quitting in the 1960s, denies EtOH  or illicit drug use.  He is retired.  Heart healthy/ADA diet.  No  regular exercise.   FAMILY HISTORY:  Noncontributory secondary to the patient's advanced  age.   REVIEW OF SYSTEMS:  Please see HPI.  All other systems reviewed and were  negative.   ALLERGIES/INTOLERANCES:  1. CODEINE.  2. PHENERGAN.  3. ATIVAN.   MEDICATIONS:  1. Zoloft 25 mg p.o. daily.  2. Lasix 20 mg p.o. daily.  3. Amaryl 4 mg p.o. daily.  4. Reglan 5 mg p.o. daily.  5. Simvastatin 20  mg p.o. nightly.  6. Plavix 75 mg p.o. daily.  7. Multivitamin p.o. daily.  8. Enteric-coated aspirin 81 mg p.o. daily.  9. Synthroid 100 mcg p.o. daily.  10.Xanax 0.5 mg p.o. b.i.d. p.r.n.  11.Nitroglycerin 0.4 mg sublingual p.r.n. for chest pain.  12.Carvedilol 6.25 mg p.o. b.i.d.   PHYSICAL EXAMINATION:  VITAL SIGNS:  Temperature 97.4 degrees  Fahrenheit, BP 120/66, pulse 76, respiration rate 19, O2 saturation 93%  on room air.  GENERAL:  The patient is alert and oriented x3, but does seem confused  (does not know his cardiologist, does not seem to remember [facility]).  The patient with some inappropriate behavior including  resting with tongue sticking out.  The patient does seem to be able to  give fairly reliable history.  HEENT:  Head is normocephalic, atraumatic.  Pupils are equal, round, and  reactive to light.   Extraocular muscles are intact.  Nares are patent  without discharge.  Dentition (was dentures upper and lower).  Oropharynx without erythema or exudates.  NECK:  Supple without lymphadenopathy.  JVP approximately 10-12 cm.  No  thyromegaly.  HEART:  Regular rate and rhythm.  Audible S1 and S2, 1/6 systolic  murmur.  Pulses are 2+ and equal on both upper and lower extremities  bilaterally.  LUNGS:  Bilateral rhonchi with mild expiratory wheezes, mild bilateral  crackles at bases.  SKIN:  No rashes, lesions, or petechiae.  ABDOMEN:  Soft, nontender, mild-to-moderate distention and normal  abdominal bowel sounds.  No rebound or guarding.  No hepatosplenomegaly.  No pulsations.  EXTREMITIES:  No clubbing or cyanosis, 1+ ankle edema bilaterally.  No  joint deformities or effusions.  No spinal or CVA tenderness.  NEUROLOGIC:  The patient is alert and oriented x3 on neuro exam and  cranial nerves II through XII are grossly intact.  Strength 5/5 in all  extremities and axial groups.  Normal sensation throughout and normal  cerebellar function.   RADIOLOGY:  1. Chest x-ray shows mildly progressive cardiomegaly with interval      mild changes of CHF.  2. Echocardiogram completed on June 2008 showing diastolic      dysfunction, LVEF of 40-45%, aortic valve mild-to-moderately      thickened and mildly calcified.  3. EKG showing rhythm that is AV paced at a rate of 72, left axis      deviation.  No evidence of hypertrophy.  No significant ST-T wave      changes or Q-waves in comparison to tracing completed one September      2009, QRS is 162 and QTc is 549.   LABS:  WBC 5.4, HGB 11.3, HCT 33.3, PLT count 190.  Sodium 139,  potassium 4.6, chloride 110, CO2 26, BUN 27, creatinine 1.61 (baseline  1.1-2.7 over the last 3 years, last 1 year baseline 1.4-1.6), glucose  124.  First set of point of care markers are negative.  BNP is 726.  Urinalysis was within normal limits.  A PTT 48.  PT 14.8 and  INR 1.1.   ASSESSMENT AND PLAN:  Mr. Stowers is an 77 year old Caucasian male with a  history of coronary artery disease, mixed cardiomyopathy (EF 40-45%),  presenting with cough, shortness of breath progressive over the last  week and some mild confusion.  1. Congestive heart failure.  The patient does appear volume      overloaded on exam.  NYHA class III symptomatology, something that      was came on gradually, elevated  BNP, elevated JVP, creatinine      basically stable (chronic kidney disease approximately at      baseline).      a.     We will diurese with IV Lasix 40 mg q.8 h.      b.     We will check a 2-D echocardiogram and interrogate Medtronic       pacemaker as well as CardioMEMS device.      c.     Continue Coreg, add afterload reduction, hydralazine 10 mg       p.o. q.8 h. and titrate as tolerated, add Imdur 30 mg p.o. daily       also.  2. Confusion.  The patient is oriented x3, seems mild, no urinary      tract infection on UA.  WBC is normal and afebrile.  Question      related to volume overload/congestive heart failure.  We will check      a head CT, TSH, and B12.  3. As above, admit to telemetry, continue home medications, and follow      up with results of above.      [name], [facility]      [name] R. Bensimhon, MD  Electronically Signed    MS/MEDQ  D:  09/12/2008  T:  09/12/2008  Job:  [id number]

## 2010-08-07 NOTE — Cardiovascular Report (Signed)
NAME:  BRADFORD, CAZIER NO.:  [id number]   MEDICAL RECORD NO.:  [id number]          PATIENT TYPE:  INP   LOCATION:  6529                         FACILITY:  [facility]   PHYSICIAN:  [name], MDDATE OF BIRTH:  Apr 21, 1919   DATE OF PROCEDURE:  09/18/2006  DATE OF DISCHARGE:                            CARDIAC CATHETERIZATION   PROCEDURES PERFORMED:  1. Right heart catheterization.  2. CardioMems pulmonary artery pressure sensor placement.   INDICATIONS:  Recurrent symptomatic heart failure.   DESCRIPTION OF PROCEDURE:  Risks and benefits of the procedure were  explained.  Consent was signed and placed on the chart.  The right groin  area was prepped and draped in a routine sterile fashion and an 11-  [place] venous sheath was placed in the right femoral vein using a  modified Seldinger technique.  Initially, a standard right heart  catheterization was performed using a Swan-Ganz catheter.  Several  pulmonary angiograms were taken in both AP and lateral projections to  identify a suitable pulmonary artery branch for placement of the sensor.  Once an appropriate branch was selected, the Swan-Ganz catheter was  removed over a  wire and the CardioMems device was deployed without any  complications.  Followup pressure measurements were taken.  Device was  calibrated.  All catheters were then removed.  There were no apparent  complications.   FINDINGS:  1. Right atrial pressure mean of 2.  2. RV pressure 21/0.  3. PA pressure 22/9 with a mean of 15.  4. Pulmonary capillary wedge was 8/4 with an mean of 4.  5. Fick cardiac output was 5.8 with a cardiac index of 2.8, that is      Fick.   ASSESSMENT:  1. Normal filling pressures.  2. Successful CardioMems implant.   PLAN:  We will observe him overnight, then hopefully he will be able to  be discharged home in the morning.  We will await his randomization  status.      [name], MD  Electronically Signed     DRB/MEDQ  D:  09/18/2006  T:  09/18/2006  Job:  [id number]

## 2010-08-07 NOTE — Assessment & Plan Note (Signed)
[facility]                         ELECTROPHYSIOLOGY OFFICE NOTE   SAYAN, ALDAVA                         MRN:          [id number]  DATE:09/16/2007                            DOB:          [birth year]    Mr. Torti is seen at the request of Dr. [name] to establish  pacemaker care.  He has ischemic heart disease with congestive heart  failure, previous intervention of his LAD and ejection fraction of 40-  45% with a pacemaker implanted a couple of years back by Dr. Armanda Magic, for bradycardia.  He now has sinus node dysfunction as well as  complete heart block.   MEDICATIONS:  1. Coreg 6.25 b.i.d.  2. Lisinopril 10.  3. Synthroid 100 mcg.  4. Zoloft 50.  5. Potassium.  6. Plavix 75.  7. Lasix 120/80.   PHYSICAL EXAMINATION:  VITAL SIGNS:  His blood pressure today was 131/79  and his pulse was 82.  He was not weighed because he was in a  wheelchair.  NECK:  His neck veins were flat.  LUNGS:  Clear.  HEART:  Sounds were regular with a 2/6 murmur.  ABDOMEN:  Soft.  EXTREMITIES:  Surprisingly absence of edema.   Interrogation of his Medtronic Sigma pulse generator demonstrates a P-  wave of 1.4, impedance of 550, and threshold 1.5 at 0.5.  There is no  intrinsic ventricular rhythm with impedance of 640 and threshold of 1.5  at 0.4.   IMPRESSION:  1. Sinus node dysfunction with chronotropic incompetence.  2. Complete heart block.  3. Status post pacer for the above.  4. Ischemic heart disease.      a.     Prior percutaneous coronary intervention.      b.     Depressed left ventricular function with a last ejection       fraction of 40-45%, see prior medical inspection.   Mr. Rossa is pacemaker is stable.  It has an estimated longevity of 4  years.  I made the rate response a little more aggressive in the hopes  of helping him a little bit   We will see how he does.     Duke Salvia, MD, [facility]  Electronically  Signed    SCK/MedQ  DD: 09/16/2007  DT: 09/17/2007  Job #: [id number]

## 2010-08-07 NOTE — H&P (Signed)
NAME:  [name], [name]                  ACCOUNT NO.:  [id number]   MEDICAL RECORD NO.:  [id number]          PATIENT TYPE:  INP   LOCATION:  4734                         FACILITY:  [facility]   PHYSICIAN:  Kela Millin, M.D.DATE OF BIRTH:  11-May-1919   DATE OF ADMISSION:  09/10/2006  DATE OF DISCHARGE:                              HISTORY & PHYSICAL   PRIMARY CARE PHYSICIAN:  [name]   CHIEF COMPLAINT:  Worsening leg swelling.   HISTORY OF PRESENT ILLNESS:  The patient is an 77 year old white male  with past medical history significant for congestive heart failure,  coronary artery disease, status post pacemaker in 2004, hypothyroidism,  depression, obesity, hypertension, and vitamin B12 deficiency, who  presents with the above complaints.  He states that he has had  progressive leg swelling in the past 2-3 days, and the home health RN  obtained a weight on him on the date prior to admission and per family,  they were told that he had gained a lot of weight but they do not  remember exactly how much.  The patient also admits to cough productive  of whitish phlegm for the past few days, and states that he has been  sleeping in his recliner for the past few days (at baseline he uses 2  pillows).  He denies PND.  Family reports that the patient has appeared  short of breath to them, although the patient states that he has not had  any difficulty breathing.  He denies chest pain, fevers, dysuria,  melena, hematochezia, and no diarrhea.   The patient was seen in the ER, and the chest x-ray revealed  cardiomegaly with mild edema, and his brain natriuretic peptide was  noted to be elevated at 953. Point of care markers negative.  He is  admitted to the [facility] or further evaluation and  management.   PAST MEDICAL HISTORY:  As stated above.   PAST SURGICAL HISTORY:  1. Status post cholecystectomy.  2. Status post appendectomy.  3. Status post cardiac  catheterization with PTCA.   MEDICATIONS:  1. Plavix 75 mg daily.  2. Synthroid 50 mcg daily.  3. Benicar 20 mg daily.  4. Xanax 0.5 mg q.12h. p.r.n.  5. Lasix 40 mg p.o. q.a.m. and 20 mg p.o. q.p.m.  6. Zoloft 50 mg daily.  7. KCl 40 mEq daily.  8. Multivitamin.  9. Vitamin B12 injection once monthly.   ALLERGIES:  ATIVAN AND CODEINE.   SOCIAL HISTORY:  He denies alcohol, denies tobacco.   FAMILY HISTORY:  Positive for diabetes, hypertension, and coronary  artery disease.  He lives at home with family.   REVIEW OF SYSTEMS:  As per HPI, other review of systems negative.   PHYSICAL EXAMINATION:  GENERAL:  The patient is a pleasant, elderly  white male, hard of hearing, in no respiratory distress.  VITAL SIGNS:  Temperature 98.3, blood pressure 177/81, pulse 72,  respiratory rate 14, O2 saturation of 96%.  HEENT:  PERRL, EOMI, sclerae anicteric, moist mucous membranes, no oral  exudates.  NECK:  Supple, no adenopathy.  No thyromegaly and no JVD.  LUNGS:  Decreased breath sounds at the bases with few bibasilar  crackles, no wheezes.  CARDIOVASCULAR:  Regular rate and rhythm, normal S1, S2.  ABDOMEN:  Soft, bowel sounds present.  Nontender, nondistended, no  organomegaly, and no masses palpable.  EXTREMITIES:  +2 edema extending up to just below his knees.  No  cyanosis.   LABORATORY DATA:  Chest x-ray as per HPI.  The brain natriuretic peptide  is 963.  Point of care markers negative x1.  Sodium is 140, potassium  3.3, chloride 102, BUN 10, glucose 117, pH is 7.35.  His creatinine is  1.2.  White cell count is 4.3 with hemoglobin of 10.9, hematocrit 31.5,  and the platelet count is 151.  Neutrophil count is 68%.   ASSESSMENT/PLAN:  1. Congestive heart failure exacerbation - we will obtain serial      cardiac enzymes, a 2 D echocardiogram, and also recheck TSH.  We      will continue diuresis with IV Lasix following consult cardiology      in the a.m. - the patient  requesting [facility].  We will      monitor I's&O's as well as daily weights.  We will continue      outpatient medications.  2. Uncontrolled hypertension - We will resume outpatient medications,      monitor blood pressures and further manage as appropriate.  3. Coronary artery disease - as above, serial cardiac enzymes,      continue outpatient medications.  4. Hypothyroidism - continue Synthroid.  Check TSH as above.  5. Diabetes mellitus - diet controlled, obtain a hemoglobin A1c and      follow.  6. Hypokalemia - replace potassium.  7. History of vitamin B12 deficiency - continue monthly vitamin B12.      Kela Millin, M.D.  Electronically Signed     ACV/MEDQ  D:  09/11/2006  T:  09/11/2006  Job:  [id number]   cc:   [name], M.D.

## 2010-08-07 NOTE — Consult Note (Signed)
NAME:  [name], [name] NO.:  [id number]   MEDICAL RECORD NO.:  [id number]          PATIENT TYPE:  INP   LOCATION:  4734                         FACILITY:  [facility]   PHYSICIAN:  Rollene Rotunda, MD, FACCDATE OF BIRTH:  Sep 26, 1919   DATE OF CONSULTATION:  09/11/2006  DATE OF DISCHARGE:                                 CONSULTATION   PRIMARY CARE PHYSICIAN:  [name], M.D.   REASON FOR CONSULTATION:  Evaluate patient with congestive heart failure  and lower extremity swelling.   HISTORY OF PRESENT ILLNESS:  The patient is a very pleasant 77 year old  gentleman who has had by his report significant problems with volume and  multiple hospitalizations.  I see lots of ER visits.  He was last  hospitalized, it looks like, in the fall of last year.  He has a  moderately reduced LV function by echocardiogram  in October 2007.  He  reports compliance with medications.  He reports compliance with  stopping table salt recently.  Despite this, he has had progressive  lower extremity and abdominal swelling.  He ambulates with a walker  because of leg weakness.  He says he has not had any limitations there.  He has not had any new shortness of breath.  Denies any PND or  orthopnea.  He does not report any chest discomfort and does not recall  any even at the time of angioplasty in 2004.  He does not have any neck  or arm discomfort.  He does not have any palpitation, presyncope, or  syncope.   Of note, in his room he had saltines and sardines, so it is clear he  does not absolutely understand salt restriction.  He does weight himself  daily, but I do not think he dose adjusts his diuretic.  He does not  keep his feet elevated, though he has an easy chair.   He was admitted from the emergency room and found to have a BNP level of  593.  He is currently being managed with IV diuresis.   PAST MEDICAL HISTORY:  1. Coronary artery disease (noted to have an occluded LAD in  2004.  He      underwent PTCA, with 50% residual).  2. Moderately reduced ejection fraction.  3. Apparent diastolic dysfunction.  4. Poorly controlled hypertension.  5. Hypothyroidism.  6. Diabetes mellitus, diet controlled.  7. Depression.  8. B12 deficiency.  9. Apparent bradycardia, status post pacemaker placement.   PAST SURGICAL HISTORY:  1. Cholecystectomy.  2. Appendectomy.   ALLERGIES:  1. CODEINE.  2. PHENERGAN.  3. ATIVAN.   MEDICATIONS CURRENTLY:  1. Lasix 60 mg b.i.d. IV  2. Plavix 75 mg daily  3. Avapro 150 mg daily.  4. Synthroid 50 mcg daily.  5. Zoloft 50 mg daily.  6. K-Dur.  7. Multivitamin.  8. Protonix 40 mg daily.  9. Enoxaparin 40 mg at bedtime.  10.At home, the patient takes Benicar 20 mg daily and Lasix 60 mg      q.a.m. and 40 mg q.p.m.   SOCIAL HISTORY:  The  patient's son lives with him.  His daughter is his  healthcare power of attorney.  He quit smoking 35 years ago after 25-30  pack-years.  [place] was [place].  He  is retired.   FAMILY HISTORY:  Noncontributory for early coronary disease, though his  father died in his 52s of unknown causes.   REVIEW OF SYSTEMS:  Positive for profound hearing loss, chronic two-  pillow orthopnea.  Negative for other systems.   PHYSICAL EXAMINATION:  GENERAL:  The patient is in no acute distress.  He is very pleasant.  VITAL SIGNS:  Blood pressure 125/68, heart rate 69 and regular,  afebrile.  HEENT:  Eyes unremarkable.  Pupils equal, round, and reactive.  Fundi  not visualized.  Oral mucosa unremarkable, edentulous.  NECK:  Jugular venous distention to the jaw at 45 degrees.  Carotid  upstroke brisk and symmetric.  No bruits, no thyromegaly.  LYMPHATICS:  No cervical, axillary, or inguinal adenopathy.  BACK:  No costovertebral angle tenderness or kyphoscoliosis.  LUNGS:  Clear to auscultation, without wheezing or crackles.  HEART:  PMI not displaced or sustained.   S1 and S2 within normal limits.  No S3, no S4, no clicks, no rubs, and no murmurs.  ABDOMEN:  Flat, positive bowel sounds, normal in frequency and pitch, no  bruits, no rebound, no guarding, no hepatomegaly, no splenomegaly.  SKIN:  No rashes, no nodules.  EXTREMITIES:  2+ upper pulses, 2+ femorals without bruits, 1+ dorsalis  pedis and posterior tibialis bilaterally, moderate bilateral lower  extremity edema to the knees, with chronic venous stasis changes.  NEUROLOGIC:  Oriented to person, place, and time.  Cranial nerves  grossly intact, motor grossly intact throughout.   EKG:  Atrioventricular pacing, 100% capture.   LABORATORIES:  WBC 4.3, hemoglobin 11.2, sodium 141, potassium 4, BUN  12, creatinine 1.28.  TSH 7.54.  Troponin is negative x3.  BNP 953.   ASSESSMENT AND PLAN:  1. Heart failure patient has acute on chronic systolic and diastolic      heart failure.  An echocardiogram is pending.  At this point, he      predominantly has right-sided symptoms.  He is continuing with IV      diuresis.  He is going to need quite a bit of this before      discharge, as I think he is significantly volume overloaded.  He is      going to need quite a bit of education.  I think we can keep him      out of the hospital, however, by careful followup in our heart      failure clinic.  He is going to be educated about salt, fluid      restriction, keeping his feet elevated, daily weights, dose      adjusting his Lasix if he is able.  I am also going to start a low-      dose beta blocker, as I do not know of any absolute or relative      contraindication.  2. Coronary artery disease.  I do not think he has any active      ischemia.  We will manage this symptomatically.  3. Hypertension.  We will manage this in the context of titrating his      heart failure medications.  4. Followup.  We will follow him closely in the heart failure clinic.      Fayrene Fearing  Antoine Poche, MD, [facility]  Electronically  Signed     JH/MEDQ  D:  09/11/2006  T:  09/12/2006  Job:  [id number]   cc:   [name], M.D.

## 2010-08-07 NOTE — Assessment & Plan Note (Signed)
[facility]                          CHRONIC HEART FAILURE NOTE   BRITTON, BERA                         MRN:          [id number]  DATE:03/12/2007                            DOB:          05-02-1919    PRIMARY CARDIOLOGIST:  Dr. Nicholes Mango.   PRIMARY CARE PHYSICIAN:  None at this time.   I received a call from [name], patient's daughter, stating her  father had signs of volume overload and could we work him into the  schedule today.  Mr. Denio appears today after a 4 month lapse in care  as he had 5 no-shows since that time.  He states that he has been doing  well up until this past week when he ate some Flounder and a baked  potato from [facility] that his daughter brought home for him this  past Saturday and on Sunday his weight started going up.  He is now up  13 pounds total since I saw him in September.  He is complaining of some  increased shortness of breath with rest, denies any orthopnea or PND.  He has complained of some atypical chest pain.  Denies any  lightheadedness or dizziness.  I asked him if he had had any problems  with his diabetes, he states his sugars have been running high but he  does not consistently check it as he states his diet controls that and  he has not been seen by a primary care physician in quite some time and  his daughter states that they are looking to establish care with a new  primary care physician.  Patient also states he has never had his  pacemaker checked since it was put in by Traci Turner and in reviewing  his medical records with us I do not see that he has ever established  care here with either of our electrophysiologists.  It appears his  pacemaker was put in by Traci Turner of [facility] before he  established care with us.  Mr. Corallo did undergo cardiac  catheterization/right heart cath with placement of a CardioMEMS  pulmonary artery pressure sensor device by Dr. Bensimhon in  June of this  year.   PAST MEDICAL HISTORY:  1. Congestive heart failure secondary to nonischemic cardiomyopathy      with ejection fraction currently 40% - 45%.  Remote history of      cardiac cath, status post balloon procedure to a blood vessel in      20 04 per patient's report (old documentation states the patient had      an occluded left anterior descending and underwent status post      percutaneous transluminal coronary angioplasty with 50% residual).  2. Status post placement of a permanent pacemaker by Dr. Armanda Magic      for history of bradycardia.  3. History of poorly controlled hypertension.  4. Hypothyroidism.  5. Diabetes, supposedly diet controlled yet patient does not check his      sugars.  6. Depression.  7. B-12 deficiency.  8. Dyslipidemia.  9. Status post implantation  of a CardioMEMS pulmonary artery sensor      device in 2008 via right heart cath,   REVIEW OF SYSTEMS:  As stated above.   CURRENT MEDICATIONS:  1. Zocor 20.  2. Lasix 80 in the a.m., 40 in the evening.  3. K-Dur 20 b.i.d.  4. Plavix 75.  5. Zoloft 50.  6. Multivitamin daily.  7. Vitamin B-12 injection once a month.  8. Aspirin 81.  9. Coreg 6.25 b.i.d.  10.Lisinopril 10 mg daily.  11.Synthroid 100 mcg daily.   PHYSICAL EXAMINATION:  Weight 206, patient's weight is up 13 pounds  since September, blood pressure 131/82 with a pulse of 81.  Patient  states he has not taken his Coreg yet this morning or his lisinopril, he  only took his Lasix.  He is in no acute distress.  JVD 8-10 sonometers  at a 45 degree angle.  LUNGS:  He had some scattered fine wheezing, otherwise clear to  auscultation.  CARDIOVASCULAR:  Reveals an S1 and S2, regular rate and rhythm.  ABDOMEN:  Soft, nontender, positive bowel sounds, distended.  LOWER EXTREMITIES:  With +3 pitting edema bilateral lower extremities.  NEUROLOGICALLY:  Alert and oriented x3.   IMPRESSION:  Congestive heart failure with signs of  volume overload at  this time, will increase Lasix to 80 mg b.i.d.  Patient will have blood  work drawn on Monday, a BMET and a BNP and I have advised him to keep  that appointment with me for the 30th for re-evaluation as he is often  inept in keeping his appointments.  I have also advised him to  discontinue any seafood from [facility] as I would imagine the salt  content is rather high.  Will also go ahead and have his pacemaker  interrogated today, have patient establish care with Dr. Ladona Ridgel.      Dorian Pod, ACNP  Electronically Signed      Rollene Rotunda, MD, [facility]  Electronically Signed   MB/MedQ  DD: 03/12/2007  DT: 03/13/2007  Job #: [id number]

## 2010-08-07 NOTE — Discharge Summary (Signed)
NAME:  [name], [name] NO.:  [id number]   MEDICAL RECORD NO.:  [id number]          PATIENT TYPE:  INP   LOCATION:  2004                         FACILITY:  [facility]   PHYSICIAN:  Corinna L. Lendell Caprice, MDDATE OF BIRTH:  1919/09/16   DATE OF ADMISSION:  12/20/2007  DATE OF DISCHARGE:  12/30/2007                               DISCHARGE SUMMARY   DISCHARGE DIAGNOSES:  1. Klebsiella pneumoniae urinary tract infection and sepsis.  2. Septic shock.  3. Acute renal failure.  4. Uncontrolled diabetes.  5. Ischemic cardiomyopathy with ejection fraction of 40-45%.  6. Hypothyroidism.  7. Coronary artery disease with history of percutaneous transluminal      coronary angioplasty in 2004.  8. History of bradycardia status post pacemaker.  9. Hypertension.  10.History of B12 deficiency.  11.Status post fall.  12.Failure to thrive, appetite improved on empiric Reglan, I suspect      an element of diabetic gastroparesis.  13.Depression/anxiety.  14.Deconditioning.  15.Improved toxic encephalopathy.  16.Resolved metabolic acidosis.  17.Abnormal cardiac enzymes secondary to hypotension and infectious      process, no evidence of acute coronary syndrome.  18.Hyperkalemia.   DISCHARGE MEDICATIONS:  1. Synthroid 100 mcg a day.  2. Coreg 6.25 mg p.o. b.i.d.  3. Zoloft 20 mg a day.  4. Multivitamin a day.  5. Aspirin 81 mg a day.  6. Plavix 75 mg a day.  7. Reglan 5 mg p.o. q.a.c.  8. Cephalexin 250 mg p.o. q.8 h. for 3 more days, then stop.  9. He has been started on Amaryl 4 mg a day, may need adjustment based      on blood sugars.  10.Xanax 0.5 mg p.o. b.i.d. as needed for anxiety.  11.His lisinopril has been held due to the renal function and      hyperkalemia.  12.Nitroglycerin 0.4 mg sublingually as needed for chest pain.  13.His Lasix has been started back at a much smaller dose of 20 mg a      day.  Please titrate for fluid retention or other symptoms of heart  failure.  Currently, he is compensated.  14.Zocor 20 mg a day.   Condition is stable.   ACTIVITY:  Continue physical therapy and occupational therapy.   DIET:  High calorie, low-salt diabetic diet with Ensure supplements  t.i.d.   FOLLOWUP:  He will need a basic metabolic panel, a CBC, and a B-type  natriuretic peptide repeated within a week.  Follow up with [facility] in 1-2 weeks.  Follow up with Dr. [name] or nursing  home physician within 2-4 weeks.  Please monitor daily weights and  adjust Lasix as needed.  Condition stable.   CONSULTATIONS:  Critical Care Medicine.   PROCEDURES:  Right subclavian central line placement.  Arterial line  placement in the right radial artery.   LABORATORY ON ADMISSION:  The patient had a normal white count with 87%  neutrophils and 7% lymphocytes.  The rest of his CBC was unremarkable.   At discharge, his hemoglobin is 10.8 and hematocrit 31.9.  Erythrocyte  sedimentation  rate on admission was 104.  PTT on admission was 39, INR  1.1, D-dimer 5.58.  Complete metabolic panel on admission significant  for a sodium of 133, potassium of 3.3, chloride of 97, bicarbonate of  25, glucose 361, BUN 58, creatinine 2.56.  Total bilirubin 1.9 and  albumin 2.6.  Calcium 8.3.  His bicarbonate dropped to low at 13 and was  corrected.  His total bilirubin peaked at 2.3, alkaline phosphatase 206,  SGOT 80, but quickly normalized.  His potassium on December 26, 2007, was  6.4 and was treated.  On December 28, 2007, his potassium was 5.1 and  magnesium 2.6.  At discharge, his BUN is 16, creatinine 1.22.  BNP on  admission was 423, peaked at 1200 and at discharge was 460.  Urinalysis  shows a moderate ketones, moderate bilirubin, moderate blood, 100  protein, greater then 8 urobilinogen, negative nitrite, large leukocyte  esterase, 21-50 white cells, and many bacteria.  Urine culture grew out  greater than 100,000 colonies of Klebsiella pneumoniae  which was  sensitive to everything except ampicillin.  Blood cultures one out of  two grew out the same Klebsiella strain.  A.m. cortisol was 41.  TSH was  2.851.  Hemoglobin A1c was 9.7.  Lactic acid was 2.0.  Lipase and  amylase normal.  Cardiac enzymes first showed a myoglobin of greater  than 500, troponin was 0.07, CPK-MB was normal, and MB fraction was 6.8.   SPECIAL STUDIES/RADIOLOGY:  An EKG showed ventricular pacing.  CT brain  showed nothing acute, stable chronic ventriculomegaly.  Chest x-ray  showed nothing acute.  Pelvic x-rays showed osteopenia, no fracture.  Repeat chest x-ray for central line placement was stable with no  pneumothorax and good placement.  Renal ultrasound showed no  obstruction, mild cortical atrophy.  Two views of the abdomen on the  first showed mild gaseous distention of a few loops of bowel with a few  air-fluid levels.  No obstruction.  Repeat abdominal films were  negative.   HISTORY AND HOSPITAL COURSE:  [name] is a delightful 77 year old  white male with multiple medical problems.  He presented with fevers and  had fallen.  He had been living alone and had a daughter who helped with  medications and checked on him multiple times a day.  He reported  urinary incontinence and dysuria.  He slid off the bed and was unable to  get up.  Prior to admission, he was on an ACE inhibitor and high doses  of Lasix.  He had a normal vital signs on admission, dry mucous  membranes, and was found to have a urinary tract infection.  He was also  found to have acute renal failure.  His ACE inhibitor and Lasix were  held.  The patient was started on IV antibiotics and admitted to the  hospital.  On December 22, 2007, he became hypotensive and had altered  mental status.  He was felt to be in septic shock and was moved to an  intensive care unit.  Critical Care Medicine was consulted and  instituted the sepsis protocol.  His antibiotic spectrum was broadened.   He had a central line and arterial line placed.  His sugars were  uncontrolled, and he was started on insulin drip.  His blood cultures  and urine cultures grew out Klebsiella pneumoniae which was sensitive to  everything except ampicillin.  His antibiotics were tailored  accordingly.  He quickly recovered from his hypotension and  was able to  be transferred out of the unit.  He continued to have a few problems  with confusion but this resolved.  He had an elevated BNP after his  blood pressure dropped, but he clinically did not appear to be in  failure.  His pressors were weaned off.  He was transferred out of the  unit.  His renal function improved.  He developed some hyperkalemia,  which responded to medical treatment.  He continued to improve except in  terms of his appetite.  He was really not eating anything.  He vomited  once.  He was having bowel movements.  Reglan was started and seemed to  help tremendously.  Currently, he is tolerating a diet and really enjoys  Ensure supplements.  He has been encouraged to take more of his meals.  He worked with Physical Therapy and Occupational Therapy and skilled  nursing facility short-term has been recommended.  The patient agrees to  this.  His heart failure remained compensated off lisinopril and Lasix.  This will need to be watched very carefully.  His blood pressure has  improved, and his Coreg has been resumed.   His diabetes was supposedly diet controlled prior to admission, but this  appeared to not be the case.  His hemoglobin A1c was over 9, and his  blood sugars remained elevated.  He was started on insulin and  subsequently transitioned to Amaryl with good results.  Currently, he is  awaiting placement.   He will need blood glucose monitoring q.a.c. and nightly and any  adjustments in his diabetic regimen should be made.  Any changes in  medications or status may need to be dictated by discharging physician.      Corinna  L. Lendell Caprice, MD  Electronically Signed     CLS/MEDQ  D:  12/30/2007  T:  12/31/2007  Job:  [id number]   cc:   [name], M.D.  [name], MD

## 2010-08-10 NOTE — Discharge Summary (Signed)
NAME:  [name], [name]                            ACCOUNT NO.:  [id number]   MEDICAL RECORD NO.:  [id number]                   PATIENT TYPE:  INP   LOCATION:  2902                                 FACILITY:  [facility]   PHYSICIAN:  Armanda Magic, M.D.                  DATE OF BIRTH:  24-Jul-1919   DATE OF ADMISSION:  12/22/2002  DATE OF DISCHARGE:  12/24/2002                                 DISCHARGE SUMMARY   PRIMARY CARE Leeanna Slaby:  Dr. [name].   PROCEDURES:  A.  Two-dimensional echocardiogram revealing normal left  ventricular function, ejection fraction 51%, without regional wall motion  abnormalities.  B.  (Dr. Armanda Magic) Dual-chamber permanent transvenous pacemaker.  Generator:  Sigma [id number].  Serial number:  [id number].  Atrial lead:  Medtronic 5594-53 centimeter.  Serial number:  [id number].  Ventricular  lead:  Medtronic [id number] centimeter.  Serial number:  [id number].   DISCHARGE DIAGNOSES:  1. Third-degree heart block with slow ventricular response.     a. Two-dimensional echocardiogram revealing normal left ventricular        function without regional wall motion abnormalities.     b. (December 23, 2002) Dr. Armanda Magic:  Successful implantation of        dual-chamber permanent transvenous pacemaker; Medtronic.  2. Congestive heart failure secondary to severe bradycardia:     a. Good response with resolution of congestive heart failure with        diuretics.     b. Mild elevation of CK-MB, peak of 154/6.0, peak troponin I 4.5.        Admission beta natriuretic peptide 672.  As left ventricular function        within normal range, will plan outpatient adenosine Cardiolite.  3. Hypertension:  Admission elevated blood pressure of 178/58, subsequently     110s to 140s, systolic, during admission.  Stable.  4. Diabetes mellitus, type 2:  Not currently on antihyperglycemics.     Hemoglobin A1c elevated at 7.3.  Serum glucose, this admission, ranging     129 to 149.   Plan outpatient management with Dr. [name].  5. History of anemia of chronic disease/B12 deficiency:  Hemoglobin okay,     this admission, at 13.1, hematocrit 37.7.  6. Mild hypothyroidism:  Thyroid-stimulating hormone slightly elevated at     7.016 (reference range 0.4 to 5.5).  Started low-dose Synthroid 0.025 mg     p.o. daily, this admission.  Plan followup thyroid-stimulating hormone in     six weeks.   PLAN:  1. Patient discharged home in stable condition.  2. Discharge medications:     a. (New) Aspirin 325 mg p.o. daily.     b. Benicar 40 mg p.o. daily.     c. Lasix 40 mg p.o. daily.     d. (New) Synthroid 0.025 mg p.o. daily.  3. Activity:  No driving, no using  left arm to lift/carry, nor raising left     arm over head until seen by Dr. Mayford Knife.  4. Diet:  Low salt, low fat.  5. Wound care:  No showering; may sponge bathe and  pat dry wound -- pacer     site.  Do not rub.  Further followup per Dr. Mayford Knife.  6. Special instructions:  Stress test scheduled for January 11, 2003 at     12:30 p.m.  Our office will send additional information.  7. Followup:  Dr. Armanda Magic, Tuesday, January 04, 2003, at 9:45 a.m.     Will need lab work to check thyroid in six weeks.   HISTORY OF PRESENT ILLNESS:  Mr. [name] Seeberger. Taylor is an 77 year old gentleman  with history of hypertension, non-insulin-dependent diabetes mellitus, and  history of anemia/B12 deficiency.  He presented to his primary care  physician's office with a one- to two-week history of generalized weakness,  fatigue, DOE, dizziness/lightheadedness and worsening peripheral edema.  His  diuretic dose, he says, had been increased as outpatient without much  relief.  On day of admission, he was found by his primary M.D. to be  severely bradycardic, heart rates 30s.  EKG revealed complete heart block.  He was transferred to the ER for admission.   Further details of hospital admission as above.   LABORATORY TESTS AND DATA:   WBC 4.8, hemoglobin 13.1, hematocrit 37.7,  platelets of 123,000.  Differential within normal range.  Pro time 13.0, INR  of 1, PTT of 31.  Admission sodium 141, potassium of 5.0, subsequently 3.6.  Chloride of 102, CO2 of 26, glucose 129, subsequently 149.  BUN of 16,  creatinine 1.0; calcium, total protein, albumin and LFTs all within normal  range.  Glycosylated hemoglobin A1c elevated at 7.3 (reference range 4.6 to  6.1).  First CK of 154, MB of 6, troponin I of 0.04.  Second CK of 127, MB  fraction 5.7, troponin I of 0.05.  Third CK of 121, MB fraction of 5.2,  troponin I of 0.07.  Cholesterol of 169, triglycerides elevated at 162, HDL  of 27, LDL of 110.  TSH elevated at 7.016 (reference range 0.4 to 5.5).   Chest x-ray, December 22, 2002, revealed cardiac enlargement with mild  edema.  No effusion.  Chest x-ray, December 24, 2002, revealed resolution of  CHF.  Atrial and ventricular pacemaker leads appeared well-positioned  without pneumothorax.   Admission EKG had revealed complete heart block, rate of 37, left anterior  fascicular block and right bundle branch block (T wave depression in V3).   PREVIOUS MEDICAL HISTORY:  1. Hypertension.  2. Diabetes mellitus, type 2.  3. History of anemia/B12 deficiency.  4. Back surgery approximately three years earlier.  5. History of appendectomy.      Salomon Fick, N.P.                       Armanda Magic, M.D.    MES/MEDQ  D:  01/08/2003  T:  01/08/2003  Job:  [id number]   cc:   [name], M.D.  [address] [place]  [place] [address]  Fax: [phone]

## 2010-08-10 NOTE — H&P (Signed)
NAME:  [name] NO.:  [id number]   MEDICAL RECORD NO.:  [id number]          PATIENT TYPE:  EMS   LOCATION:  MAJO                         FACILITY:  [facility]   PHYSICIAN:  Sherin Quarry, MD      DATE OF BIRTH:  September 02, 1919   DATE OF ADMISSION:  06/07/2005  DATE OF DISCHARGE:                                HISTORY & PHYSICAL   [name] is an 77 year old gentleman who lives with his son. His daughter  who accompanies him today lives close by and checks on him frequently.  According to the patient's daughter, [name] has been having some  difficulties recently because his wife has been placed in a nursing home due  to Alzheimer's disease. As a result he has been quite sad and unhappy. He is  normally quite functional. He is frequently found working out in the yard,  he is able to walk without difficulty and is able to care for himself fairly  successfully.  He continues to chew tobacco. According to the patient's  daughter, for about 2 days he has been complaining about dysuria associated  with urinary frequency and a feeling of incomplete bladder emptying.  Last  night he experienced some coughing and wheezing which caused his son  concern. They called Dr. Roselie Skinner office and an appointment was arranged  to see [name] this afternoon.  However later on in the day he began to  experience some coughing and family became concerned about his well being  and brought him [facility] emergency room. On arrival his temperature was  98.6, blood pressure 165/65, pulse 77, respirations 20, O2 saturation was  97% on two liters. Subsequently work up has included a urinalysis which  shows marked pyuria and bacteruria. A chest x-ray is normal, showing no  acute infiltrates or evidence of congestive heart failure. His white count  is 5900, hemoglobin 12.1. Sodium is 139, potassium 3.4, glucose is 138.  Hemoglobin is 12.2. In light of these symptoms and findings it was  felt  prudent to admit the patient to the hospital for further evaluation and  treatment particularly in light of his advanced age.   PAST MEDICAL HISTORY:   CURRENT MEDICATIONS:  1.  Plavix 75 mg daily.  2.  Zoloft 25 mg daily.  3.  Xanax 0.25 mg b.i.d.  4.  Synthroid 1 tablet daily, possibly 50 mcg.   ALLERGIES:  He is reported to be allergic to CODEINE AND PHENERGAN.   OPERATIONS:  1.  According to the patient's daughter in 2004 he had a pacemaker placed.      He has also had a previous cardiac catheterization and PTCA.  2.  Cholecystectomy many years ago.  3.  Appendectomy many years ago.   MEDICAL ILLNESSES:  1.  Coronary artery disease. The patient apparently has had no recent      problems with exertional chest pain, orthopnea or PND.  2.  Hypothyroidism.  3.  The patient is reported to have a history of diabetes but does not take      any  medication for this problem.  4.  Vitamin B12 deficiency. The patient's daughter indicates that he is due      for his B12 shot.  5.  Depression.   FAMILY HISTORY:  Noncontributory.   SOCIAL HISTORY:  As mentioned previously, the patient is currently living  with his son. He chews tobacco. He does not smoke or use alcohol.   REVIEW OF SYSTEMS:  HEAD:  The patient denies headache or dizziness. EYES:  Denies visual blurring or diplopia. EAR/NOSE/THROAT:  Denies earache, sinus  pain or sore throat. CHEST:  He has a persistent cough as best I can tell it  seems to be non productive. He has had no chest pain. CARDIOVASCULAR: Denies  orthopnea or PND. I note that he has 1+ ankle edema. GI: [name] denies  nausea, vomiting, abdominal pain, hematemesis or melena. GU:  See above,  note that he has been experiencing dysuria, urinary frequency and nocturia.  He denies hematuria. NEURO:  There is no history of seizure or stroke.  ENDOCRINE:  Denies excessive thirst or nocturia.   PHYSICAL EXAMINATION:  GENERAL:  The patient is really  quite alert, he is  able to answer questions and has no apparent mental confusion.  HEENT EXAM:  Remarkable for a lot of residual tobacco in his mouth, his  tongue is brown in color.  CHEST:  Remarkable for diminished breath sounds, I hear a few scattered  rhonchi.  CARDIOVASCULAR EXAM:  Reveals normal S1 and S2, there are no rubs, murmurs  or gallops.  ABDOMINAL EXAM:  The patient's bladder feels somewhat full, it is diffusely  mildly tender over the bladder, otherwise the abdomen seems to be benign,  there is no guarding or rebound.  NEUROLOGIC TESTING:  Within normal limits.  EXTREMITIES:  Reveal 2+ pretibial edema.   IMPRESSION:  1.  Urinary tract infection.  2.  Bronchitis.  3.  Probable urinary retention presenting as urinary frequency and      hesitancy.  4.  Coronary artery disease status post percutaneous transluminal coronary      angioplasty.  5.  Hypothyroidism.  6.  History of B12 deficiency.  7.  History of situational depression.   PLAN:  Will obtain a urine culture. I think the patient should have a Foley  placed because I feel that he is probably not emptying his bladder. Will  start him on Cipro 400 mg q.12 hours and will add Zithromax to this because  the patient may have some components of bronchitis even though his chest x-  ray is normal. Will continue Plavix and thyroid medication and make sure  that he receives his B12 shot. I discussed code status with the patient.  There is  some discrepancy, the patient's daughter indicates that he has told her on  several occasions that he wishes to be DNR status. However when I talked  with [name] himself, he stated that he wished to be a full code. He  wishes to be intubated or resuscitated in the event of cardiac arrest.           ______________________________  Sherin Quarry, MD     SY/MEDQ  D:  06/07/2005  T:  06/09/2005  Job:  [id number]   cc:   [name], M.D.  Fax: [phone]  Thereasa Solo. Little,  M.D.  Fax: [phone]

## 2010-08-10 NOTE — H&P (Signed)
NAME:  [name], [name] NO.:  [id number]   MEDICAL RECORD NO.:  [id number]          PATIENT TYPE:  INP   LOCATION:  0447                         FACILITY:  [facility]   PHYSICIAN:  Corinna L. Lendell Caprice, MDDATE OF BIRTH:  03-25-1920   DATE OF ADMISSION:  05/22/2004  DATE OF DISCHARGE:                                HISTORY & PHYSICAL   CHIEF COMPLAINT:  Vomiting.   HISTORY OF PRESENT ILLNESS:  [name] is a 77 year old white male who is  a somewhat difficult historian but who presented to the emergency room with  vomiting.  He reports that this has been going on for a few weeks. He has  also had a cough productive of greenish sputum. He has had no shortness of  breath. The patient has also had diarrhea for two weeks. He denies chest  pain but reportedly he had said to his daughter previously that he had some  chest pain after throwing up.   PAST MEDICAL HISTORY:  1.  Coronary artery disease.  2.  Hypothyroidism.  3.  Diabetes.  4.  History of B12 deficiency with anemia.  5.  History of depression.   SOCIAL HISTORY:  The patient is here with his daughter.  He lives at home  with his wife he has Alzheimer's and a son who helps care for them.  He  chews tobacco.   FAMILY HISTORY:  Noncontributory.   REVIEW OF SYMPTOMS:  As above otherwise negative.   PHYSICAL EXAMINATION:  VITAL SIGNS:  Temperature 97.0, blood pressure  133/45, pulse 88, respiratory rate 20, oxygen saturation 91% on room air.  GENERAL:  The patient is in no acute distress.  HEENT:  Normocephalic, atraumatic, pupils equal round and reactive to light.  He has moist mucous membranes and tobacco juice on his beard.  He has some  purplish papules on his lower lip which I believe were there during his last  hospitalization.  NECK:  Supple, no JVD, no lymphadenopathy, no thyromegaly.  LUNGS:  With rales at the left base, no rhonchi, no wheeze.  CARDIOVASCULAR:  Regular rate and rhythm without murmur,  gallop or rub.  ABDOMEN:  Normal bowel sounds, soft, nontender, nondistended.  GU/RECTAL:  Deferred.  EXTREMITIES:  No cyanosis or clubbing. He does have brawny edema, woody.  NEUROLOGIC:  The patient is alert and oriented, somewhat forgetful about the  specifics of dates, medications, medical history, etc.  Cranial nerves and  sensory motor exam are grossly intact.  PSYCHIATRIC:  The patient is calm and cooperative.   LABORATORY DATA:  CBC, white count 8.7, hemoglobin 12.6, hematocrit 35.8,  platelet count 147, 89% neutrophils, 4% lymphocytes.  Complete metabolic  panel is significant for a glucose of 178, albumin of 3.2 otherwise  unremarkable.  CPK, MB and troponin normal. BNP is 471.   Chest x-ray shows cardiac enlargement, left lower lobe pneumonia versus  edema.  EKG shows AV pacing.   ASSESSMENT/PLAN:  1.  Vomiting most likely secondary to left lower lobe pneumonia.  I will      give Avelox and antiemetics.  The patient no longer complains of chest      pain but I will check another set of cardiac enzymes in the morning.  2.  Coronary artery disease with ejection fraction of 60% by echocardiogram      in 2004.  Continue outpatient medications.  3.  Hypothyroidism.  4.  Diabetes. The patient has been taken off his hypoglycemics recently due      to hypoglycemic episodes.  5.  History of B12 deficiency.  6.  History of depression.      CLS/MEDQ  D:  05/22/2004  T:  05/22/2004  Job:  [id number]   cc:   [name], M.D.  [address] [place]  [place] [address]  Fax: [phone]

## 2010-08-10 NOTE — Discharge Summary (Signed)
NAME:  [name] NO.:  [id number]   MEDICAL RECORD NO.:  [id number]          PATIENT TYPE:  INP   LOCATION:  5733                         FACILITY:  [facility]   PHYSICIAN:  Sherin Quarry, MD      DATE OF BIRTH:  07-20-1919   DATE OF ADMISSION:  06/07/2005  DATE OF DISCHARGE:                                 DISCHARGE SUMMARY   [name] is an 77 year old man who presented to the emergency room on  March 16 with lethargy and malaise associated with a recent history of  dysuria and urinary incontinence.  On evaluation in the emergency room,  HEENT examination was within normal limits.  The examination of the chest  revealed a few scattered rhonchi.  Breath sounds were slightly diminished.  Cardiovascular exam revealed normal S1 and S2.  There were no rubs, murmurs  or gallops.  The abdomen was benign.  There was normal bowel sounds.  The  bladder felt somewhat full and there was a diffuse tenderness over the  suprapubic area.  Studies in the emergency room included serial blood  cultures which are negative.  The urinalysis showed marked pyuria and  bacteruria.  The urine culture which was obtained at that time is currently  growing greater than 100,000 colonies of gram-negative rods.  CBC showed a  white count of 5,900, hemoglobin 12.1.  The creatinine was 1.3.  BUN was 19.  Glucose 138, potassium 3.4.  Because I was concerned about possible urine  retention, I requested that a Foley catheter be placed.  The patient was  noted to have a severe phimosis of the penis and a Foley catheter could not  be placed.  Therefore, Dr. Annabell Howells of the urology service was consulted.  Dr.  Annabell Howells confirmed the presence of the phimosis and he dilated this with a 20  French filiform catheter and then placed a 16 French Foley catheter.  He  recommended that the Foley catheter be left in place and that the patient  under either a dorsal slit or circumcision procedure to relieve the  phimosis.  On admission, Mr. Glaze was placed on Cipro at a dose of 400 mg  IV every 12 hours.  I also put him on Zithromax 500 mg daily because he  described some symptoms of bronchitis.   The first night that Mr. Casteneda was in the hospital, he became rather  confused and agitated and required one dose of Haldol.  Subsequently, he  seemed to be just fine and was very alert.  By March 18, the patient was  feel well and was extremely eager to be discharged.  Dr. Annabell Howells advised the  patient and his daughter that he would go ahead and schedule the  circumcision procedure when he went into the office on Monday.  The  patient's daughter is going to call him in the office to find out exactly  when this procedure is scheduled for.  Dr. Annabell Howells also requested that the  patient stop Plavix for seven days prior to the procedure.  Therefore, on  March 18, the  patient was discharged.   DISCHARGE DIAGNOSES:  1.  Urinary tract infection.  2.  Urinary retention secondary to phimosis.  3.  Possible bronchitis.  4.  Coronary artery disease status post percutaneous transluminal coronary      angioplasty, status post pacemaker.  5.  Hypothyroidism.  6.  History of B12 deficiency.  7.  History of situational depression.   DISCHARGE MEDICATIONS:  The patient will be advised to take:  1.  Cipro 500 mg p.o. b.i.d. for a total of two weeks.  2.  He will be advised to stop Plavix because Dr. Annabell Howells is going to be doing      the surgical procedure described.  He should be off Plavix for seven      days before the surgery.  3.  He will also be advised to take Zoloft 25 mg daily.  4.  To continue Synthroid.  5.  To continue his B12 shots.  6.  To continue Xanax 0.25 mg p.o. p.r.n. for anxiety.   He will followup with Dr. Annabell Howells as described above and will also followup on  a regular basis with [name].   CONDITION AT TIME OF DISCHARGE:  Fair.           ______________________________  Sherin Quarry,  MD     SY/MEDQ  D:  06/09/2005  T:  06/10/2005  Job:  [id number]   cc:   [name], M.D.  Fax: [phone]   Excell Seltzer. Annabell Howells, M.D.  Fax: [phone]

## 2010-08-10 NOTE — Consult Note (Signed)
NAMECRYSTIAN, [name] NO.:  [id number]   MEDICAL RECORD NO.:  [id number]          PATIENT TYPE:  INP   LOCATION:  0465                         FACILITY:  [facility]   PHYSICIAN:  Hollice Espy, M.D.DATE OF BIRTH:  1920/02/09   DATE OF CONSULTATION:  08/04/2004  DATE OF DISCHARGE:                                   CONSULTATION   REFERRING PHYSICIAN:  Madlyn Frankel. Charlann Boxer, M.D.   REASON FOR CONSULTATION:  Medical clearance.   HISTORY OF PRESENT ILLNESS:  The patient is an 77 year old white male with  past medical history of hypertension, PVD, depression, and diabetes, type 2,  who presents after a fall and found to have a hip fracture needing repair.  The patient previously had some mild dementia and does not remember his  actual fall in itself, but he is quite oriented and able to answer questions  appropriately.  He is alert, oriented to time, date, age, and himself, as  well as location and scenario.  He just does not recall the initial fall.  He tells me that prior to his fall he was ambulating well.  He has never had  any chest pain, no shortness of breath.  We will discuss his medical history  extensively and he is doing well.  He complained of some soreness currently  in his hip area but denies any headaches, visual changes, dysphagia, chest  pain, palpitations, shortness of breath, wheeze, cough, or abdominal pain,  hematuria, dysuria, constipation, diarrhea.  He denies any nausea or  vomiting.   REVIEW OF SYSTEMS:  Review of systems is otherwise negative.   LABORATORY DATA:  When the patient was admitted he had an EKG done which  showed paste rhythm.  The rest of his labs were unremarkable; however, he as  noted to have a shift on his white count and he does have a urinary tract  infection.   PAST MEDICAL HISTORY:  1.  Hypertension.  2.  Depression.  3.  Diabetes, type 2, which is diet controlled.  4.  He has some mild confusion which may be underlying  dementia.  5.  He has a history of pacemaker placement secondary to a complete heart      block.  6.  Also a history of peripheral vascular disease.   MEDICATIONS:  1.  The patient is on Plavix 75 p.o. daily.  2.  Lasix 40 daily.  3.  Zoloft 25 p.o. daily.  4.  Benicar 20 daily.  5.  Xanax 0.5 p.o. b.i.d. p.r.n.  6.  In addition he has been started on Lovenox.  7.  P.r.n. Zofran.  8.  P.r.n. morphine.   ALLERGIES:  The patient has no known drug allergies.   SOCIAL HISTORY:  He denies any tobacco, alcohol, or drug use.   FAMILY HISTORY:  Notable for CAD.   PHYSICAL EXAMINATION:  VITAL SIGNS:  Most recent set of vitals:  Temperature  97.8, blood pressure 117/58, heart rate 74, respirations 18, O2 saturation  96% on room air.  GENERAL:  In general the patient is alert  and oriented x3, no apparent  distress.  HEENT:  Normocephalic, atraumatic.  Mucous membranes are moist.  NECK:  He has no carotid bruits.  HEART:  Regular rate and rhythm, S1/S2 which is a paced rhythm.  LUNGS:  Clear to auscultation bilaterally.  ABDOMEN:  Soft, nontender, nondistended.  Positive bowel sounds.  EXTREMITIES:  No clubbing, cyanosis, trace pitting edema.   LABORATORY DATA:  The patient's PTT is elevated at 54, although this has  been since Lovenox was started.  He is noted to have a positive UTI with 21-  50 red and white cells, many bacteria, moderate leukocyte esterase, greater  than 300 protein, large blood, 40 of ketones.  He does have a Foley catheter  which may account for his bleeding.  He has a normal white count of 9.5 with  an 84% shift though.  H&H to 10.8, 39.3, MCV of 87, platelet count 228.  Sodium 141, potassium 5.1, chloride 106, bicarb 29, BUN 15, creatinine 1.3,  glucose 138, calcium 9.5.   Chest x-ray shows no active disease and there is no enlargement.  Hip films  show a fracture of left femoral/left ext.rotation of the femur.   ASSESSMENT/PLAN:  1.  Hip fracture.  The  patient is medically cleared.  He does have coronary      artery disease equivalent with his PVD; however, he has been on proper      medication.  He was previously diet controlled on his diabetes, although      we will go ahead and check the following labs, but he is felt to be      medically stable for his surgery.  To further ensure, we will go ahead      and check a set of cardiac enzymes.  2.  Urinary tract infection.  Will start the patient on IV Cipro 400 mg.      This urinary tract infection may have caused some mild confusion and may      be the account of the patient's falling and then not remembering the      event.  There is no way to be sure.  3.  Peripheral vascular disease.  Would hold Plavix for surgery, continue      Lovenox.  Once the patient is able to restart Plavix would start this as      soon as we can.  4.  Hypertension.  Would continue the patient's Benicar and Lasix, holding      the Lasix immediate postoperative, as he may be prone to dehydration.  5.  Depression.  Continue Zoloft.  6.  Diabetes mellitus.  Since the patient is on diet controlled, go ahead      and check CBGs b.i.d.  The patient is n.p.o. right now.      SKK/MEDQ  D:  08/04/2004  T:  08/04/2004  Job:  [id number]   cc:   Madlyn Frankel Charlann Boxer, M.D.  [facility]  [address]  [address]  [place]  [place] [address]  Fax: [phone]

## 2010-08-10 NOTE — Discharge Summary (Signed)
NAME:  [name], [name]                            ACCOUNT NO.:  [id number]   MEDICAL RECORD NO.:  [id number]                   PATIENT TYPE:  INP   LOCATION:  0372                                 FACILITY[facility]  [facility]   PHYSICIAN:  Cristy Hilts. Jacinto Halim, M.D.                  DATE OF BIRTH:  02/05/1920   DATE OF ADMISSION:  08/17/2003  DATE OF DISCHARGE:                                 DISCHARGE SUMMARY   ATTENDING CARDIOLOGIST:  Dr. Julieanne Manson   CHIEF COMPLAINT:  Nausea, fullness in his abdomen and chest.   HISTORY:  An 77 year old Caucasian male with history of coronary disease  admitted to [facility] Emergency Room with complaints of abdominal  discomfort.  The patient had been drinking excessive amounts of Pepsi at  home and he stated this probably is contributing to his abdominal  discomfort.  The patient was admitted with questionable upper  abdominal/lower chest pain and was ruled out for myocardial infarction.  Hence, because he was asymptomatic with no chest pain or abdominal pain and  his CK-MBs were negative, the patient was deemed fit to be discharged.   His Past Medical History included history of coronary artery disease status  post PTCA and stenting of the proximal LAD; however, it was found that the  mid to distal LAD had completely occluded and hence he was being treated  medically by Dr. Verdis Prime and Dr. Armanda Magic.  This was done on  February 07, 2003.  He had developed a complete heart block and underwent  permanent transvenous pacemaker implantation with a Sigma [id number] generator  with the use of Medtronic AV leads on December 24, 2002.   PHYSICAL EXAMINATION:  As of Aug 18, 2003, his vital signs included a  temperature of 98, pulse is 66, respirations of 16, blood pressure 145/67  mmHg.  His cardiac examination revealed S1 & S2 is normal.  There is a  systolic ejection murmur heard in the mitral and aortic area.  Chest  examination revealed bilateral contractions, no  crackles.  Abdominal exam  shows unremarkable, obese abdomen.  No hepatosplenomegaly.  Bowel sounds  heard in all 4.  Extremities examination revealed 2+ edema.   PERTINENT FINDINGS:  His BMP revealed a sodium of 140, potassium 3.3,  chloride 106, bicarb 28, BUN 9, creatinine 1, blood sugar 140.  His CBC  revealed a hemoglobin of 14.9, hematocrit of 39.6, white count 5.6, platelet  count 179.   CK 120/126/109.   MB 4.2/4.1.   Troponins 0.02/0.02 which were both negative.   Telemetry showed AV paced rhythm, 12-lead EKG showed a lead-paced rhythm.   Electrocardiogram preliminary:  Anteroseptal hypokinesis.  LV systolic  function on the lower limit of normal.  Mild tricuspid valve regurgitation  and moderate pulmonary hypertension.   OVERALL IMPRESSIONS:  1. Abdominal pain probably related to gastroesophageal reflux disease.  Possibility of coronary artery disease progression cannot be completely     excluded in a patient who is diabetic and who is noncompliant with     medications.  However, no unstable anginal symptoms, and troponins have     been negative with no CK elevation.  2. Mild congestive heart failure probably diastolic heart failure.  BNP on     admission was 548.  Received 60 mg of IV Lasix yesterday.  3. History of coronary artery disease status post cath February 07, 2003, by     Verdis Prime with a percutaneous transluminal coronary angioplasty in the     proximal left anterior descending, was found to have mid to distal left     anterior descending occlusion, hence being treated medically.  4. History of permanent pacemaker implantation on December 23, 2002 for     complete heart block with Dr. Armanda Magic with a Medtronic Sigma [id number]     generator.  5. Diabetes mellitus type 2 uncontrolled with a hemoglobin A1C of 7.4.  6. B12 deficiency anemia.  The patient is noncompliant and has missed B12     injections.  7. History of left subclavian vein thrombosis  following pacemaker     implantation.  Has been on chronic Coumadin therapy.  His INR was     subtherapeutic at 1.6 on admission.   RECOMMENDATIONS:  1. The patient will be discharged home as he refuses to be in the hospital     for inpatient Cardiolite stress test.  Also his symptoms are atypical and     I do not feel that his symptoms are equal to unstable angina.  2. Due to the fact that his DVT was iatrogenic, I do not feel it is     necessary for Coumadin to be continued beyond 3 to 6 months.  Also, there     is the issue of noncompliance and his INR was subtherapeutic.  Hence, we     will start him on aspirin and Plavix.  3. We will treat him with Prevacid 30 mg p.o. daily for GERD.  4. Aggressive therapy for hypertension and diabetes is indicated.  The     patient advised to follow up with [name] for these evaluations.  5. He does have a lower extremity edema.  This is secondary to previous     tobacco abuse and COPD with moderate pulmonary hypertension.  6. The patient also has history of hypothyroidism was presently on therapy.     His TSH was normal at 2.9 on admission.  The patient will follow up with     Dr. Julieanne Manson.   DISCHARGE MEDICATIONS:  1. Aspirin 81 mg p.o. daily.  2. Plavix 75 mg p.o. daily.  3. Lasix 40 mg p.o. daily with a fluid restriction.  4. Coreg 12.5 mg p.o. b.i.d.  5. Potassium 20 mEq two tablets p.o. daily.  6. Synthroid 25 mcg p.o. daily.  7. Imdur 30 mg p.o. daily.  8. Prevacid 30 mg p.o. daily.                                               Cristy Hilts. Jacinto Halim, M.D.    Pilar Plate  D:  08/18/2003  T:  08/19/2003  Job:  [id number]   cc:   Thereasa Solo. Little, M.D.  [address]  [address]  [place]  [place] [address]  Fax: [phone]   [facility]   [name], M.D.  [address] [place]  [place] [address]  Fax: [phone]

## 2010-08-10 NOTE — Discharge Summary (Signed)
NAME:  [name], [name] NO.:  [id number]   MEDICAL RECORD NO.:  [id number]          PATIENT TYPE:  INP   LOCATION:  1509                         FACILITY:  [facility]   PHYSICIAN:  Madlyn Frankel. Charlann Boxer, M.D.  DATE OF BIRTH:  1919-11-16   DATE OF ADMISSION:  08/03/2004  DATE OF DISCHARGE:  08/10/2004                                 DISCHARGE SUMMARY   ADMISSION DIAGNOSES:  1.  Left hip femoral neck fracture.  2.  Hypertension.  3.  Depression.  4.  Diabetes type 2 controlled with diet.  5.  Pacemaker.   DISCHARGE DIAGNOSES:  1.  Left hip femoral neck fracture.  2.  Hypertension.  3.  Depression.  4.  Diabetes type 2 controlled with diet.  5.  Pacemaker.  6.  Mild postoperative anemia.  7.  Urinary tract infection (treated).   CONSULTS:  Hollice Espy, M.D., internal medicine.   OPERATION:  On Aug 07, 2004, the patient underwent size 2 cemented XL  femoral stem hemiarthroplasty to the left hip. Jenne Campus assisted.   BRIEF HISTORY:  This 77 year old male was found in his home in a mild  confused state. Apparently, he had fallen. He did not remember falling. When  seen in the emergency room, he did have orientation to time, day, and  person. His family was present, and the history was gleaned primarily from  them. X-rays showed a displaced femoral neck fracture. This is a relatively  active gentleman who does have some mild mental discord but does fairly well  for himself. It is felt he would benefit from surgical intervention, being  admitted for medical clearance and then hemiarthroplasty to the left hip.   Dr. Rito Ehrlich saw the patient and felt that he would be a good candidate for  surgery. He had been on Plavix. We had to allow the INR to drift down to  make it safe for surgery. Once that was done, he went ahead with the above  procedure.   The patient was noted to have urinary tract infection on admission, and we  treated that with Cipro. The Cipro  dose will end Aug 09, 2004.   The patient participated with physical therapy in his room with moderate  assist. We allowed weight bearing as tolerated on the operative extremity as  this was a cemented prosthesis.   On the day before his anticipated discharge, his wound was dry. The patient  was awake and alert. Neurovascular was intact in the operative extremity.  Vital signs were stable. He did have hemoglobin of 8.6 with hematocrit of  24.7. We had the patient on Trinsicon (iron supplement) t.i.d. It was felt  he might benefit with an inpatient rehab program; however, due to his  intermittent confusion and memory loss, it was felt that he might need more  lengthy therapy, so therefore, skilled nursing was considered. Once the  facility is chosen, he will be transferred there to continue with his  physical therapy, weight bearing as tolerated, on the operative extremity.  See plan below.   Laboratory values  in the hospital hematologically showed CBC on admission  which was completely within normal limits. Again, final hemoglobin was 8.6,  hematocrit was 24.7. Blood chemistries remained normal in the hospital other  than a slightly elevated glucose. His drug toxicology testing was negative  other than benzodiazepines. Urinalysis remarkable positive for urinary tract  infection. Chest x-ray showed no active disease. Electrocardiogram showed AV  sequential or dual chamber electronic pacemaker.   CONDITION ON DISCHARGE:  Improved, stable.   PLAN:  The patient is to continue with physical therapy, weight bearing as  tolerated. He will probably need home health, even after his stay at the  skilled nursing facility/rehab. Dry dressing to the hip p.r.n. Staples may  be removed at two to two and a half weeks after surgery using Steri-Strips  as necessary. Return to see Dr. Charlann Boxer at [phone] three to four weeks after  the date of surgery. Medical questions should be directed towards Dr.  [name]. Any cardiology questions to Dr. Caprice Kluver.   MEDICATIONS AT DISCHARGE:  1.  Zoloft 25 mg 1 daily.  2.  Avapro 150 mg 1 daily.  3.  Lasix 40 mg 1 daily.  4.  Colace 100 mg b.i.d.  5.  Trinsicon 1 capsule t.i.d.  6.  Coumadin protocol for 4 weeks after date of surgery.  7.  His Cipro dose will end today.  8.  Zofran p.r.n. nausea.  9.  Robaxin 500 mg q.6h. muscle spasm.  10. Vicodin 1 to 2 q.4-6h. p.r.n. pain.  11. Xanax 0.5 mg tablets p.r.n. restlessness.      DLU/MEDQ  D:  08/09/2004  T:  08/09/2004  Job:  [id number]   cc:   [name], M.D.  [address] [place]  [place] [address]  Fax: [phone]   Thereasa Solo. Little, M.D.  [address]  [address]  [place]  [place] [address]  Fax: [phone]

## 2010-08-10 NOTE — Op Note (Signed)
NAME:  PHINEHAS, GROUNDS NO.:  [id number]   MEDICAL RECORD NO.:  [id number]          PATIENT TYPE:  INP   LOCATION:  1509                         FACILITY:  [facility]   PHYSICIAN:  Madlyn Frankel. Charlann Boxer, M.D.  DATE OF BIRTH:  1919-07-14   DATE OF PROCEDURE:  08/07/2004  DATE OF DISCHARGE:                                 OPERATIVE REPORT   PREOPERATIVE DIAGNOSES:  Left hip femoral neck fracture.   POSTOPERATIVE DIAGNOSES:  Left hip femoral neck fracture.   PROCEDURE:  Left hip hemiarthroplasty.   COMPONENTS USED:  A size 2 cemented Excell femoral stem DePuy, size 28, 1.5  inner ball and a 55 mm outer diameter ball.   SURGEON:  Madlyn Frankel. Charlann Boxer, M.D.   ASSISTANT:  Cherly Beach, P.A.-C.   ANESTHESIA:  General.   ESTIMATED BLOOD LOSS:  500 mL.   DRAINS:  None.   COMPLICATIONS:  None.   INDICATIONS FOR PROCEDURE:  Mr. Alverio is a pleasant 77 year old gentleman  who presented to the emergency room after falling. Interestingly enough, he  apparently had primary complaints of right sided hip pain, workup was  negative. He had continued difficulty in ambulating and was subsequently  returned to the emergency room. At that point, he was noted to have a left  hip fracture. Orthopedics was consulted. The patient was noted to be on  Plavix. Based on him being on Plavix, the surgery was delayed secondary to a  platelet dysfunction. The risks and benefits of all treatment planned were  outlined and reviewed with him, questions were encouraged and answered and  consent obtained.   DESCRIPTION OF PROCEDURE:  The patient was brought to the operative theater.  Once operative anesthesia and preoperative antibiotics, 1 g of Ancef, were  administered, the patient was positioned in the right lateral decubitus  position with the left side up. The left lower extremity was then prepped  and draped in a sterile fashion. A lateral based incision was made for a  posterior approach to the  hip. Short external rotators were taken down  separately from the posterior capsule which was saved and later repaired to  the superior leaflet. The fracture was noted to be pretty extensive in the  mid surgical region. A small osteotomy was made in the distal portion in the  calcar. The femoral head was then removed and sized to be 55 mm in diameter.  Following this, femoral exposure was obtained. Debridement was carried out  as necessary. The femur was then prepared for cemented Excell fracture stem  per protocol. A single conical reamer was passed followed by broaching from  the starter to a 1 to a 2. This provided good fit. Trial reduction was  carried out with a 55 mm bipolar ball. The hip was very stable, leg lengths  appeared equal. With this information, final components were brought onto  the field, we measured the distal canal with a size 5 cement restricter from  [employer]. The size restricter was passed, the canal was then prepared with  a pulse lavage system for third generation cementing  techniques. The canal  was dried with sponges. Once the cement was ready, the cement was injected  in a retrograde fashion. The size 2 fracture stem was then placed down into  the femoral canal with an attempt to keep it out of varus. The fracture was  then held in about 20 degrees of anteversion until the cement had cured.  Excessive cement was removed during this period of time. Once the cement had  cured, the final 28 plus 1.5 inner diameter, 55 mm outer diameter bipolar  ball was then tapped under a clean and dry trunnion and the hip reduced. The  hip was stable in the trial reduction and I was happy with the reduction.   The wound was copiously irrigated with normal saline solution. The posterior  capsule was reapproximated to itself with #1 Ethibond. At this point, the  remainder of the wound was closed in layers with no drain placed. The wound  was closed in layers with staples on the  skin. The hip was cleaned and dried  and dressed sterilely with a sterile bulky dressing and tape. The patient  was then transferred extubated to the recovery room in stable condition.   He tolerated the procedure without complications and will have him up  weightbearing as tolerated. I would like for him to be on Coumadin for three  weeks and then transferred over back to Plavix following this DVT  prophylaxis. I would choose this for the ability to have it reversed if need  be.      MDO/MEDQ  D:  08/07/2004  T:  08/07/2004  Job:  [id number]

## 2010-08-10 NOTE — Consult Note (Signed)
NAME:  [name], SUNDT NO.:  [id number]   MEDICAL RECORD NO.:  [id number]          PATIENT TYPE:  INP   LOCATION:  5733                         FACILITY:  [facility]   PHYSICIAN:  Excell Seltzer. Annabell Howells, M.D.    DATE OF BIRTH:  Feb 02, 1920   DATE OF CONSULTATION:  06/07/2005  DATE OF DISCHARGE:  06/09/2005                                   CONSULTATION   Patient of the emergency room and [facility].   Mr. Hennes is an 77 year old white male who was brought to the emergency  room with a chief complaint of chest pain.  He was found to have a urinary  tract infection and difficulty voiding with incontinence.  The staff was  unable to place a Foley catheter.  The patient denies prior GU surgery or  history.   On review of his past history, he has a history of CHF, diabetes mellitus,  hyperlipidemia, coronary artery disease with prior MI, history of a  pacemaker, coronary stent, and history of Alzheimer's.   CURRENT MEDICATIONS:  Include Plavix, Zoloft, Xanax, and Synthroid.   He has allergies to CODEINE, ATIVAN, and PHENERGAN.   SOCIAL HISTORY:  He lives with his wife.   FAMILY HISTORY:  Noncontributory.   REVIEW OF SYSTEMS:  Pertinent for fecal and urinary incontinence.  He had  some chest pain this morning.  He has some confusion.  He has swelling in  his feet and ankles.  He is otherwise without complaints.   PHYSICAL EXAMINATION:  VITAL SIGNS:  Temperature 98.6, blood pressure  165/65, pulse 77, respirations 20.  GENERAL:  He is an elderly, alert but confused white male.  No acute  distress.  ABDOMEN:  Soft, flat, and nontender.  The bladder is not palpable.  GU:  Severe phimosis.  I am unable to expose the meatus.  The scrotum is  unremarkable.  The testicles are bilaterally descended without mass.  Epididymis are unremarkable.  Anus and perineum are without lesions.  RECTAL:  Normal sphincter tone.  The prostate is 2+ in size, benign in  consistency  without nodules.  Seminal vesicles are nonpalpable.  No rectal  masses are noted.  EXTREMITIES:  Bilateral edema.   Urine today is nitrite positive, too-numerous-to-count white cells, many  bacteria.  BUN is 19, white count is 5.9, hemoglobin 12.1.   PROCEDURE:  His penis was prepped with Betadine solution.  He was draped  with sterile towels.  A 4 French filiform was passed through the phimotic  foreskin and manipulated until the urethral meatus was entered.  It was then  advanced, and a 20 [employer] follower was placed, which screwed on the  filiform tip and advanced through the phimotic foreskin.  At this point, the  filiform and follower were removed, and a 16 French Foley catheter was  placed to the aid of a catheter guide.  The catheter was advanced into the  bladder.  The balloon was filled with 10 cc of sterile fluid.  There was a  return of 30 cc of turbid urine, which will be  sent for culture.   IMPRESSION:  1.  Severe phimosis with chronic balanitis.  2.  Urinary tract infection, probably secondary to the phimosis and      balanitis with complete bladder emptying but history of incontinence.   RECOMMENDATIONS:  Will leave the Foley to straight drainage.  The patient  should have a dorsal split procedure or circumcision when recovered from his  current episode.  He will need to be off Plavix for a week prior to the  procedure.      Excell Seltzer. Annabell Howells, M.D.  Electronically Signed     JJW/MEDQ  D:  06/09/2005  T:  06/10/2005  Job:  [id number]   cc:   Gordy Savers, M.D. [facility]  [address] [place]  [place] [address]

## 2010-08-10 NOTE — Op Note (Signed)
NAME:  [name], [name]                            ACCOUNT NO.:  [id number]   MEDICAL RECORD NO.:  [id number]                   PATIENT TYPE:  INP   LOCATION:  2902                                 FACILITY:  [facility]   PHYSICIAN:  Armanda Magic, M.D.                  DATE OF BIRTH:  03/13/1920   DATE OF PROCEDURE:  12/23/2002  DATE OF DISCHARGE:  12/24/2002                                 OPERATIVE REPORT   PROCEDURE PERFORMED:  Placement of dual chamber permanent pacemaker.   SURGEON:  Armanda Magic, M.D.   INDICATIONS FOR PROCEDURE:  Complete heart block.   COMPLICATIONS:  None.   IV MEDICATIONS:  Versed 5 mg, fentanyl 75 mcg.   INDICATIONS FOR PROCEDURE:  The patient is a very pleasant 77 year old white  male who presented with complete heart block in conjunctive with heart  failure.  He now presents for placement of a dual chamber permanent  pacemaker.   DESCRIPTION OF PROCEDURE:  The patient was brought to the cardiac  catheterization laboratory in a fasting nonsedated state.  Informed consent  was obtained.  The patient was connected to continuous heart rate and pulse  oximeter monitoring and intermittent blood pressure monitoring.  The left  anterior chest was prepped and draped in sterile fashion.  1% Xylocaine was  used for local anesthesia.  A 4 cm incision was made over the left delta  pectoral groove.  Using a combination of blunt dissection and  electrocautery, the subcutaneous tissues were dissected to reach the fascial  plane.  Using electrocautery and blunt dissection, a subcutaneous pocket was  created.  Then under ultrasound guidance, and using modified Seldinger  technique, a guidewire was placed into the left extrathoracic subclavian  vein.  A peel-away sheath was then placed over the wire under fluoroscopic  guidance into the left extrathoracic subclavian vein and advance into the  left subclavian vein.  The dilator and wire were removed and a ventricular  lead was  placed through the sheath.  The wire was then placed back through  the sheath as well and under fluoroscopic guidance advanced into the right  atrium. The sheath was then pulled away retaining the guidewire.  The  stilette was pulled out of the ventricular lead and a curved stilette was  placed into the lead.  Under fluoroscopic guidance, the lead was guided into  the right ventricular outflow tract.  This was confirmed with PVCs on the  monitor.  The stilette was then removed and a straight stilette was placed  back in the ventricular lead under fluoroscopic guidance.  The lead was then  pulled back and allowed to fall into the right ventricular apex.  After  adequate sensing and pacing thresholds were obtained.  The lead was sutured  in place with 0 Tycron.  A peel-away sheath was then placed again over the  guidewire under fluoroscopic guidance  and again into the left subclavian  vein.  The dilator and wire were removed and an atrial lead was placed  through the sheath.  The wire was then placed back through the sheath and  the wire and the atrial lead advanced into the right atrium under  fluoroscopic guidance.  The sheath was then pulled away retaining the  guidewire.  After adequate pacing and sensing thresholds were obtained, the  lead was sutured in place.  The stilettes were then removed and the  guidewire was removed and the pocket was irrigated with a solution of  polymyxin and bacitracin solution.  After adequate hemostasis was verified,  the leads were connected to the generator and the generator and leads were  placed in the subcutaneous pocket.  The fascia was closed with 2-0 Vicryl.  The subcutaneous tissues were closed with 3-0 Vicryl and the subcuticular  layer was closed with 4-0 Vicryl.  At the end of the procedure, the patient  was transferred back to his room in stable condition.  The patient tolerated  the procedure well.   GENERATOR DATA:  The generator is a Sigma  [id number], serial number [id number],  atrial lead is a Medronic 5594 53 cm, serial number [id number], the  ventricular lead is a Medtronic 5092 58 cm serial number [id number].   PACING DATA:  The atrial capture threshold was intact at 0.8 V and 0.5 msec.  Ventricular capture threshold was intact at 0.4 V and 0.5 msec.  Atrial lead  impedance 450 ohms.  Ventricular lead impedance 577 ohms.  P wave amplitude  2.8 mV.  R wave amplitude 9.8 mV.  Current on the atrial lead 2.4 mA.  Current on ventricular lead 0.8 mA.  No evidence of diaphragmatic pacing.  Chest wall pacing with 10 V pacing.   ASSESSMENT:  1. Complete heart block.  2. Successful implantation of a dual chamber pacemaker.  3. Congestive heart failure secondary to #1.   PLAN:  Discharge home in the morning if stable.                                               Armanda Magic, M.D.    TT/MEDQ  D:  02/23/2003  T:  02/23/2003  Job:  [id number]   cc:   [name], M.D.  [address] [place]  [place] [address]  Fax: [phone]

## 2010-08-10 NOTE — Discharge Summary (Signed)
NAMENORBERTO, WISHON NO.:  [id number]   MEDICAL RECORD NO.:  [id number]          PATIENT TYPE:  INP   LOCATION:  0447                         FACILITY[facility]  [facility]   PHYSICIAN:  Jackie Plum, M.D.DATE OF BIRTH:  1919/05/19   DATE OF ADMISSION:  05/21/2004  DATE OF DISCHARGE:  05/23/2004                                 DISCHARGE SUMMARY   DISCHARGE DIAGNOSES:  1.  Left lower lobe pneumonia.  2.  History of coronary artery disease.  3.  Hypothyroidism.  4.  Diabetes.  5.  B12 deficiency.  6.  Depression.   DISCHARGE MEDICATIONS:  The patient is going to resume all of his  preadmission medications which included:  1.  Plavix 75 daily.  2.  Lasix 20 to 40 daily.  3.  Zoloft 25 mg daily.  4.  Synthroid 50 daily.  5.  Aspirin 81 daily.   New medicines are:  1.  Avelox 400 mg daily for 7 days.  2.  Humibid LA 600 mg twice daily.   DISCHARGE LABORATORY DATA:  WBC 8.7 on admission, and CBC was not repeated.  He had a mild thrombocytopenia of 147. This will be repeated at the  outpatient level when he is seen by his PCP. Bmet was not repeated.   CONSULTS:  Not applicable.   PROCEDURE:  Not applicable.   REASON FOR ADMISSION:  Nausea and vomiting, dyspnea.   The patient presented with history of nausea and vomiting associated with  cough productive of greenish sputum and shortness of breath. He also had  some diarrhea. On admission, the patient's saturation was 91% on room air  with stable hemodynamics. He had rales at the left base on pulmonary  auscultation. Cardiac auscultation revealed regular rate and rhythm. No  gallops or murmur. He had some brawny edema on extremity exam. His CBC was  negative for any leukocytosis. X-ray revealed cardiac enlargement with left  lower lobe pneumonic process. He was therefore admitted for left lower lobe  pneumonia and symptomatic treatment of his nausea and vomiting.   HOSPITAL COURSE:  The patient was admitted to  hospitalist service on  telemetry monitoring. There was no significant arrhythmias. He received IV  antibiotics and expectorants as well as antiemetics. On account of his edema  and his history of diabetes and coronary artery disease, it was felt  expedient that patient have an echocardiogram which done today. It was also  still pending at time of this dictation, and hopefully, it will be available  at the time of his first outpatient visit and needs to be reviewed.  Overnight, the patient was feeling fine and deemed appropriate for  discharge. He was able to keep p.o. in without any problems. His daughter  called today, and patient is actually itching to go home, and they are aware  that the echocardiogram results are pending, and they indicated that they  would follow it up as an outpatient, and they are very happy with that plan.  On rounds today, he feels fine. No fevers, no chills, no shortness of  breath. Vital  signs are stable, and his O2 saturation was 93% last night. He  is off oxygen and not in not in acute distress. He is breathing normally,  has been ambulatory on the floor without any problems. His mucous membranes  were moist. He does not have any JVD. Lung exam revealed rhonchi of left  base area without any wheezes. Abdomen is soft and nontender. Cardiac exam  is regular. No gallops. He is alert and appropriate and is being discharged  home with outpatient followup as indicated above. He will need a repeat x-  ray in about four weeks and x-ray needs to be followed until clearance.      GO/MEDQ  D:  05/23/2004  T:  05/23/2004  Job:  [id number]

## 2010-08-10 NOTE — Discharge Summary (Signed)
NAME:  [name], Taylor NO.:  [id number]   MEDICAL RECORD NO.:  [id number]          PATIENT TYPE:  INP   LOCATION:  4735                         FACILITY:  [facility]   PHYSICIAN:  Madolyn Frieze. Jens Som, MD, FACCDATE OF BIRTH:  December 08, 1919   DATE OF ADMISSION:  01/21/2007  DATE OF DISCHARGE:  01/24/2007                               DISCHARGE SUMMARY   PRIMARY CARDIOLOGIST:  [name], MD   PRIMARY CARE PHYSICIAN:  [name], M.D.   DISCHARGE DIAGNOSES:  1. Congestive heart failure secondary to nonischemic cardiomyopathy      with EF 45%.  2. Acute renal insufficiency secondary to over diuresis.  3. Diabetes.  4. Hypotension.  Resolved during hospitalization.   PAST MEDICAL HISTORY:  1. Nonischemic cardiomyopathy.  2. Coronary artery disease status post low PTCA of the LAD with 50%      residual.  3. Poorly controlled hypertension.  4. Hypothyroidism.  5. Diabetes diet controlled.  6. Depression.  7. B12 deficiency.  8. Bradycardia status post dual-chamber pacemaker placement by Carolanne Grumbling of [facility].  9. Dyslipidemia.  10.Status post implantation of a [employer] pulmonary artery sensory      device in June 2008 via right heart cath.   PAST SURGICAL HISTORY:  1. Cholecystectomy.  2. Appendectomy.   HOSPITAL COURSE:  [name] is an 77 year old Caucasian gentleman with  known history of nonischemic cardiomyopathy, also history of coronary  artery disease who presented to [facility] emergency room complaining of  2 days of lower extremity edema, abdominal distention with orthopnea.  The patient was evaluated by Dr. Tonny Bollman.  The patient was  admitted for IV diuresis, continued home medications.  The patient had  responded well to diuretic therapy with mild elevation in creatinine of  2.5.  However, blood pressure also decreased at a low of 77/47.  By time  of discharge, the patient was stable with a BUN and creatinine of  28 and  1.9.  BNP of 254, weight at time of discharge not documented.  Last  documented weight was 90 kg on August 31.  Potassium at time of  discharge 4.7.  TSH 12.022.  Synthroid dose increased by Dr. [name]  during this hospitalization.  The patient to follow up with primary care  for reevaluation of elevated TSH.  At the time of discharge, the  patient's medications:  Lisinopril was decreased to 10 mg daily,  Synthroid increased to 100 mcg daily, Coreg 6.25 mg b.i.d., Zoloft 50 mg  daily, Lasix 40 mg p.o. b.i.d., K-Dur 20 mg p.o. t.i.d., Zocor 20 mg at  bedtime, aspirin 81 mg daily, Plavix 75 mg daily, vitamins as previously  prescribed.  The patient has been given a prescription for all  medications except for aspirin.  Blood work:  He needs a B-met drawn on  Tuesday.  This can be done in our office.  He needs a TSH checked in 12  weeks.  This can be done by primary care.  The patient has been given  heart failure education  prior to discharge.  He is to follow up with Dr.  [name] with [facility].  Our office will call the patient  with appointment date and time.   DURATION OF DISCHARGE ENCOUNTER:  Greater than 30 minutes.      Dorian Pod, ACNP      Madolyn Frieze. Jens Som, MD, [facility]  Electronically Signed    MB/MEDQ  D:  01/26/2007  T:  01/27/2007  Job:  [id number]   cc:   [name], M.D.

## 2010-08-10 NOTE — Op Note (Signed)
[place]  Patient:    [name], [name]                         MRN: [id number] Proc. Date: 12/03/99 Adm. Date:  [id number] Disc. Date: [id number] Attending:  Skip Mayer CC:         Georges Lynch. Darrelyn Hillock, M.D.   Operative Report  DATE OF BIRTH:  Oct 09, 1919  PREOPERATIVE DIAGNOSIS:  Severe spinal stenosis at L4-5 and L3-4 with severe pain and weakness of his right leg.  POSTOPERATIVE DIAGNOSIS:  Severe spinal stenosis at L4-5 and L3-4 with severe pain and weakness of his right leg.  OPERATION PERFORMED:  Bilateral complete decompressive lumbar laminectomy, L3-4 and L4-5.  A Penrose drain was inserted.  SURGEON:  Georges Lynch. Darrelyn Hillock, M.D.  ANESTHESIA:  General.  DESCRIPTION OF PROCEDURE:  Under general anesthesia with the patient in a spinal frame, routine orthopedic prepping and draping of the back was carried out.  Two needles were placed in the back for localization purposes.  X-ray was taken to verify the position.  An incision then was made over the L3-4 and 4-5 interspaces.  The muscle was separated from the lamina and spinous processes in the usual fashion with the Cobb elevators.  At this time another x-ray was taken to verify our exact position.  We then went down and removed the spinous process of L3 and L4 and did a complete bilateral decompressive lumbar laminectomy.  He had a marked spinal stenosis and foraminal stenosis especially on the right.  We went down and removed the ligamentum flavum with great care taken to protect the underlying dura.  Once the area was decompressed, the dura was now nicely expanded.  We then went out into the lateral recesses and did foraminotomies bilaterally at both levels.  We thoroughly irrigated out the area.  Good hemostasis was maintained.  We loosely applied some thrombin soaked Gelfoam and then inserted a Penrose drain and loosely approximated the wound in the usual fashion.  The  skin was closed with metal staples.  The patient had 1 gm of IV Ancef preop.  After the sterile dressing was applied, the patient left the operating room in satisfactory condition. DD:  12/03/99 TD:  12/05/99 Job: [id number]

## 2010-08-10 NOTE — Discharge Summary (Signed)
NAME:  [name], [name]                            ACCOUNT NO.:  [id number]   MEDICAL RECORD NO.:  [id number]                   PATIENT TYPE:  INP   LOCATION:  6523                                 FACILITY:  [facility]   PHYSICIAN:  Armanda Magic, M.D.                  DATE OF BIRTH:  [birth year]   DATE OF ADMISSION:  02/07/2003  DATE OF DISCHARGE:  02/11/2003                                 DISCHARGE SUMMARY   DISCHARGING PHYSICIAN:  Armanda Magic, M.D.   PRIMARY CARE PHYSICIAN:  [name], M.D.   CHIEF COMPLAINT AND REASON FOR ADMISSION:  Mr. Butt is a patient being  followed at our office by Dr. Mayford Knife.  Three weeks prior to admission this  time, the patient had undergone a permanent pacemaker insertion secondary to  third degree AV block.  He underwent an adenosine Cardiolite study done on  February 29 which was markedly abnormal with fixed anterior and inferior  defect, reversible septal defect, fixed apical defect, and decreased LV  systolic function with an EF 32% with septal apical akinesis and moderate  global hypokinesis.  He followed up with Dr. Mayford Knife on November 9.  The  results were explained.  It was suspected to be due to CAD and the patient  was to undergo cardiac catheterization.  He was seen at the office on  February 02, 2003, for cardiac workup and, at that time, was noted to have  left upper extremity edema, quite severe, 2 to 3+ edema.  Subsequent per  procedure, ultrasound of that arm was negative for any DVT.  Therefore, the  patient was admitted on February 07, 2003, for left heart catheterization  per Dr. Mayford Knife.   HOSPITAL COURSE:  Problem 1.  Abnormal Cardiolite study.  The patient  underwent left heart catheterization on February 07, 2003, per Dr. Mayford Knife.  The patient was noted to have disease in the LAD.  Dr. Katrinka Blazing was consulted  and the patient underwent PTCA of the LAD from 99% to 50% with TIMI-3 flow.  No stent was placed.  Medical treatment only.  The  following morning, the  patient's groin was deemed okay per Dr. Katrinka Blazing.  Due to underlying  thrombocytopenia with a platelet count of 140,000, Plavix was not initiated.  In regards to the coronary artery disease, the patient has done fine.  His  Coreg was increased to 12.5 mg p.o. b.i.d.  He has been stable with an AV  paced rhythm, rate in the 60s throughout the hospitalization.   Problem 2.  Left upper extremity edema.  Despite a negative venous Doppler  in the outpatient setting, Dr. Mayford Knife was still quite concerned that the  patient may have venous clot status post pacemaker insertion.  On November  16, the patient was taken to interventional radiology where he underwent a  left upper extremity venogram which showed moderately severe stenosis with  small clots at the pacer wire entrance to the subclavian vein.  BCV and SVC  are both patent otherwise.  The patient was subsequently started on heparin  as well as Coumadin and it took a period of multiple days before his INR  became therapeutic.  Still by the date of discharge, INR was not  therapeutic.  We consulted case management and the patient was deemed  appropriate for the Lovenox program here and the patient was sent home on  Lovenox 95 mg every 12 hours, to follow up at our Coumadin clinic as an  outpatient.   Problem 3.  Thrombocytopenia.  The patient's platelet count was 123,000 on  the routine pretest lab work and has remained stable without any active  signs of bleeding during the hospitalization.   Problem 4.  Congestive heart failure (CHF) with associated hypertension.  The patient has remained stable on his home doses of ARB with a noted  increase in Coreg per Dr. Mayford Knife within the first 24 hours of admission.  He  continues his same Lasix.   FINAL DISCHARGE DIAGNOSES:  1. Single vessel coronary artery disease (CAD) of the left anterior     descending (LAD), 99% down to 0% after percutaneous transluminal coronary      angioplasty (PTCA) on February 07, 2003.  2. Deep venous thrombosis (DVT) of the left upper extremity, now on     Coumadin.  3. Transient hypokalemia, resolved.  4. Hypertension and congestive heart failure (CHF), compensated.  5. History of bradycardia status post permanent pacemaker insertion.  6. Thrombocytopenia, stable.  7. Hypothyroidism, stable.  8. Continued tobacco abuse.  9. Anemia with last hemoglobin 10.8 with a hematocrit of 31.8 with a     platelet count of 136,000 on February 11, 2003.   DISCHARGE MEDICATIONS:  1. Aspirin 325 mg daily.  2. Lasix 40 mg daily.  3. Synthroid 0.25 mg daily.  4. Senokot 40 mg daily.  5. New--Imdur 30 mg daily.  6. New--Coreg 12.5 mg twice daily.  7. New--Coumadin 7.5 mg daily.  8. New--K-Dur 40 mEq daily.  9. Stop Toprol XL.  10.      New--Lovenox shot 95 mg twice daily until INR is therapeutic.   PAIN MANAGEMENT:  As previous at home.   ACTIVITY:  As tolerated.  The patient is well out of his initial 48 hours  post catheterization at discharge.   DIET:  Cardiac, low-fat, low-cholesterol diet with particular attention to  low salt with history of CHF and lower extremity edema.   FOLLOWUP APPOINTMENT:  1. He needs to follow up with Dr. Mayford Knife on February 25, 2003, at 12:30 p.m.  2. He needs to follow up at the Coumadin clinic on Monday, February 14, 2003, at 3 p.m.  In addition, he needs followup CBC, BMET when he follows     up at the Coumadin clinic visit.   WOUND CARE:  He needs to call Dr. Norris Cross registered nurse, Bonita Quin, at [phone] if he experiences any bleeding, bruising, swelling, or drainage at the  catheterization site or if he has any fever greater than or in excess of  100.5 by mouth.      Allison L. Ulla Gallo, M.D.    ALE/MEDQ  D:  03/21/2003  T:  03/21/2003  Job:  [id number]

## 2010-08-10 NOTE — H&P (Signed)
NAME:  [name], [name] NO.:  [id number]   MEDICAL RECORD NO.:  [id number]          PATIENT TYPE:  INP   LOCATION:  1845                         FACILITY:  [facility]   PHYSICIAN:  Michelene Gardener, MD    DATE OF BIRTH:  [birth year]   DATE OF ADMISSION:  07/14/2006  DATE OF DISCHARGE:                              HISTORY & PHYSICAL   PRIMARY CARE PHYSICIAN:  [name], M.D.   CHIEF COMPLAINT:  Lower extremity swelling and pain.   HISTORY OF PRESENT ILLNESS:  This is an 77 year old Caucasian male with  past medical history of multiple medical problems who presented with the  above-mentioned complaint.  Daughter is at bedside, and most of the  information was obtained from her.  As per the daughter, the patient has  frequent hospitalizations with congestive heart failure.  The last  hospitalization was in November 2007, and at that time he was diuresed  and then was sent home.  Current condition started around one week ago  when his legs started to build up fluid, and then they became swollen to  his thigh and then extended into his abdomen.  The patient himself  denied shortness of breath.  His daughter stated that he had some  shortness of breath on and off, especially with exertion.  Denied chest  pain.  Had some orthopnea.  There is no paroxysmal nocturnal dyspnea.  There are no palpitations.  In the ER, the patient was given Lasix, and  hospitalist service was called for further evaluation.   PAST MEDICAL HISTORY:  Significant for:  1. Coronary artery disease.  2. History of congestive heart failure with last echocardiogram done      in October 2005 which showed ejection fraction which was moderately      decreased, hypokinesis in the anterior wall and septal wall,      dyssynergy.  3. Hypothyroidism.  4. History of diabetes which is diet controlled.  5. Vitamin B12 deficiency.  6. Depression.  7. Hypertension.  8. Obesity.   PAST SURGICAL HISTORY:  1.  Status post pacemaker in 2004.  2. History of cardiac catheterization with PTCA.  3. Status post cholecystectomy.  4. Status post appendectomy.   ALLERGIES:  The patient is allergic to ATIVAN and CODEINE.   MEDICATIONS:  1. Lasix 60-80 mg p.o. once or twice a day, depending on his swelling.  2. Plavix 75 mg p.o. once a day.  3. Synthroid 50 mcg p.o. once daily.  4. Benicar 20 mg p.o. once daily.  5. Coreg 12.5 mg p.o. twice daily.  6. Zoloft 50 mg p.o. once daily.  7. Potassium chloride 40 mEq p.o. once daily.  8. Multivitamins 1 tablet p.o. once daily.  9. Xanax 0.5 mg p.o. q. 12 h p.r.n.   FAMILY HISTORY:  Significant for diabetes, hypertension, coronary artery  disease.   SOCIAL HISTORY:  The patient lives with his family.  He chewed tobacco.  He denies smoking and drinking.   REVIEW OF SYSTEMS:  CONSTITUTIONAL:  Notable for fatigability.  EYES:  No blurry vision.  ENT:  No tinnitus.  RESPIRATORY:  No cough, no  wheeze.  CARDIOVASCULAR:  No chest pain.  Positive for shortness of  breath and orthopnea, lower extremity edema.  There are no palpitations.  GI: Positive for nausea and vomiting; the patient vomited twice  yesterday. GU: No dysuria or hematuria.  HEMATOLOGIC:  No bruising, no  bleeding.  INFECTIOUS DISEASE:  No lesions.  Rest of systems reviewed  and are negative.   PHYSICAL EXAMINATION:  VITAL SIGNS:  Temperature 98.5, blood pressure  158/84, pulse 75, respiratory rate 20.  GENERAL APPEARANCE:  This is an elderly Caucasian male in no acute  distress.  HEENT:  Conjunctivae showed no erythema.  Pupils equal, round, and  reactive to light and accommodation. There is no ptosis.  Hearing is  diminished.  There is no ear discharge or erythema.  There is no nasal  discharge.  Oral mucosa is dry.  No pharyngeal erythema.  NECK:  Supple.  No JVD, no carotid bruits, no lymphadenopathy. No  tenderness.  CARDIOVASCULAR:  S1, S2 regular.  There is no murmurs, rubs, or  gallops.  RESPIRATORY:  Respirations 16-18.  There is no use of accessory muscles.  No intracostal retractions.  No rales, rhonchi, or wheeze.  ABDOMEN:  Regular.  Soft.  Mildly distended.  There is no tenderness .  Bowel sounds are normal.  EXTREMITIES:  Positive for bilateral lower extremity edema.  Skin is  very firm, tender to touch.  Pulses are diminished bilaterally.   Chest x-ray: Bilateral congestion.   Lab results: WBC 5.3, hemoglobin 12.8, hematocrit 36.8, MCV 88.1,  platelet count 174.  Sodium 139, potassium 3.9, chloride 107, BUN 12,  creatinine 1.1.  Urinalysis positive for UTI with urine nitrite,  leukocyte esterase, wbc's 3-6.   EKG: No evidence of ischemia.   ASSESSMENT:  1. Congestive heart failure exacerbation.  Will admit this patient to      the floor.  Will get telemetry monitor.  Will get three sets of      troponin and cardiac enzymes to make sure there is no underlying      ischemic cause of acute exacerbation.  Will change Lasix to IV.      Will give him 80 mg IV twice daily.  If there is no improvement,      then we will consider adding Zaroxolyn.  Will continue Coreg and      Benicar.  I will give him oxygen 2-3 liters via nasal cannula to      maintain his saturation.  Will also obtain DuoNeb nebulizer as      needed.  Will record his daily output and input.  2. Urinary tract infection.  Will start patient on Cipro 200 mg IV      twice daily and will send for urine culture.  3. Hypertension.  Continue current medications and will follow his      blood pressure.  4. Hypothyroidism.  Will continue Synthroid and get TSH level.  5. Coronary artery disease.  As in #1.  6. Other medical conditions remain stable, and patient will be      continued on same medications as taken as an outpatient.   Discharge Time:  1 hour.      Michelene Gardener, MD  Electronically Signed    NAE/MEDQ  D:  07/14/2006  T:  07/14/2006  Job:  [id number]   cc:   [name],  M.D.

## 2010-08-10 NOTE — H&P (Signed)
NAMECRAIGORY, TOSTE NO.:  [id number]   MEDICAL RECORD NO.:  [id number]          PATIENT TYPE:  INP   LOCATION:  0465                         FACILITY:  [facility]   PHYSICIAN:  Madlyn Frankel. Charlann Boxer, M.D.  DATE OF BIRTH:  05-30-1919   DATE OF ADMISSION:  08/03/2004  DATE OF DISCHARGE:                                HISTORY & PHYSICAL   He is an 77 year old male that was found in his home slightly confused.  He  had fallen.  He personally could not remember falling, but he does have  orientation to day, time, his birth date, his age, time of year.  His family  was not present initially, but his daughter and son were here and I did talk  with them.   PAST MEDICAL HISTORY:  1.  Hypertension.  2.  Depression.  3.  Diabetes, type 2, diet controlled.   PAST SURGICAL HISTORY:  1.  Lumbar surgery approximately four years ago, possible diskectomy.  2.  Appendectomy.  3.  Cholecystectomy.  4.  Tonsils and adenoids.  5.  He does have a pacemaker.   CURRENT MEDICATIONS:  Zoloft, Benicar, Plavix, and Lasix, doses unknown.  The daughter will call us when she gets home to give [place] the dosages.   ALLERGIES:  Son reports CODEINE created a rash.   SOCIAL HISTORY:  He is a nonsmoker, but he does chew tobacco.  He does not  drink, and he lives with his wife and son.   REVIEW OF SYSTEMS:  He complains of no headaches, no dizziness, no fever, no  chills, no swallowing difficulties, no speech difficulties.  He is missing  both upper and lower teeth.  He complains of no hemoptysis, no chest pain on  exertion, no difficulty breathing, no diarrhea or bloody stools, no  incontinence.   PHYSICAL EXAMINATION:  VITAL SIGNS:  Temperature, on admission, was 97.9,  blood pressure 153/85, respirations 20, pulse 84, pulse ox 99-95 on room  air.  GENERAL:  He appears to be a pleasant, well-developed, well-nourished,  nonacute distressed male.  He states he is in no pain currently.  CHEST:   Clear to auscultation bilaterally.  HEART:  Regular rate and rhythm.  EYES:  Pupils are equal, round, and reactive to light.  ABDOMEN:  Soft, nontender to palpation.  MUSCULOSKELETAL:  He has no tenderness to bilateral hips.  He does have an  externally rotated shortened left lower extremity  distally.  Active range  of motion is intact.  He does have a skin rash along the upper thigh and  torso on the right side of his body that is nontender to palpation.  It is  not warm.  There are no openings in the skin.  It does blanche.  He has  palpable DP and PT pulses which were intact and equal bilaterally.  He  complains of no pain on palpation to bilateral upper extremities, chest,  neck, abdomen, knees, ankles, feet.   White count 9.5, hemoglobin 13.8, hematocrit 39.3, platelets 228.  Sodium is  141, potassium 5.1, chloride 106, BUN  15, creatinine 1.3, glucose 138.  On x-  ray, he has a left femoral neck fracture.   PLAN:  1.  Dr. Durene Romans plans to do an open reduction internal fixation versus      hemiarthroplasty to the left hip, possibly Monday.  2.  We are going to hold his Plavix and start him on Lovenox 40 subcu every      day.  3.  We are going to place him in [place] traction 5 pounds.  4.  He is non-weightbearing.  5.  He does have a Foley catheter to gravity.  6.  I did call a consult for medical clearance.  Dr. Tresa Endo with [facility].      His primary care doctor is [name] who is also with [facility].  All      this was discussed with Dr. Charlann Boxer.  7.  I also ordered a chest x-ray, 12-lead EKG, as well as PT, PTT, and INR.  8.  The daughter is going to call us later with his medications to the      nurse's station.      MC/MEDQ  D:  08/03/2004  T:  08/03/2004  Job:  [id number]

## 2010-08-10 NOTE — Cardiovascular Report (Signed)
   NAME:  [name], [name]                            ACCOUNT NO.:  [id number]   MEDICAL RECORD NO.:  [id number]                   PATIENT TYPE:  OIB   LOCATION:  2857                                 FACILITY:  [facility]   PHYSICIAN:  Lesleigh Noe, M.D.            DATE OF BIRTH:  1919-08-01   DATE OF PROCEDURE:  02/07/2003  DATE OF DISCHARGE:                              CARDIAC CATHETERIZATION   INDICATIONS:  100% proximal LAD, chronic total occlusion.   PROCEDURE PERFORMED:  PTCA of the totally occluded left anterior descending.   DESCRIPTION OF PROCEDURE:  Following the diagnostic procedure by Armanda Magic, M.D. a 6-French system was used to obtain guiding shots.  We had  difficulty finding adequate guide support and ultimately used a 4OQ 6-French  guide catheter and an Asahi medium guidewire.  We then used a 2.5 mm  diameter Voyager balloon followed by a 3.0 mm Voyager balloon to dilate the  proximal LAD stenosis.  Upon opening the proximal LAD we realized that the  mid vessel was either very, very small or totally occluded and at that point  it became obvious to stent the LAD would jeopardize a large diagonal which  actually may be covering more myocardium than this small segment of LAD that  we were able to open.  Made the judgment call to simply dilate the LAD.  We  got only a moderately successful result but we did not compromise the  diagonal.  We could perhaps come back on another occasion and go straight to  PCI and attempt to open both the distal and the proximal LAD but I would not  particularly want to open the proximal LAD if the distal and mid LAD could  not be opened percutaneously.   We used heparin bolus and Integrilin infusion during the procedure.  ACT was  documented greater than 250.  We used AngioSeal arteriotomy closure after  sheathogram demonstrated anatomy that was amenable.   CONCLUSION:  Successful angioplasty of totally occluded left anterior  descending to 50% with a hazy final result, but TIMI grade 3 flow.  The mid  left anterior descending and distal left anterior descending was not  visualized and is felt to be totally occluded.   PLAN:  Aspirin and Plavix.  Integrilin x12 hours, then discontinue.  Further  management per Armanda Magic, M.D.                                               Lesleigh Noe, M.D.    HWS/MEDQ  D:  02/07/2003  T:  02/07/2003  Job:  [id number]   cc:   [name], M.D.  [address] [place]  [place] [address]  Fax: [phone]

## 2010-08-10 NOTE — Discharge Summary (Signed)
[place]  Patient:    [name], [name]                         MRN: [id number] Adm. Date:  [id number] Disc. Date: [id number] Attending:  Anastasio Auerbach CC:         Desma Maxim, M.D.  Barron Alvine, M.D.  Anselmo Rod, M.D.   Discharge Summary  DATE OF BIRTH:  1919-06-14  DISCHARGE DIAGNOSES:  1. Pseudomonas urinary tract infection.     A. Hemorrhagic cystitis.     B. Recurrent recurrent urinary tract infection.     C. Prostate-specific antigen 2.99 this admission.  2. Profound generalized weakness secondary to above.  3. Dehydration.  4. Subacute diarrhea.     A. Stopped prior to admission.     B. Placed on Questran by Dr. Madilyn Fireman.  5. Malnutrition.  6. Urinalysis with large blood and only 0-5 rbcs; CK 18.  7. Diet-controlled diabetes (glycohemoglobin 4.6).  8. Significant weight loss (question underlying malignancy):  The patient     refuses further workup.     A. Documented weight August 2001 151, February 26 132.     B. Weight was 220 a little over a year ago per family.  9. History of colon polyps.     A. Endoscopy two years ago, 19 polyps.     B. The patient declined further workup. 10. Anemia.     A. B12 deficiency (level 175).     B. Total iron 21, TIBC 16, percent saturation 17 (chronic disease element        as well as iron deficiency). 11. Chronic right knee pain. 12. Slightly elevated alkaline phosphatase (148), etiology unclear. 13. Chronic back pain; spinal stenosis surgery, fall 2001.  ALLERGIES:  No known drug allergies.  ADMISSION MEDICATIONS:  The patient on no medications prior to admission.  DISCHARGE MEDICATIONS: 1. Iron sulfate 325 mg q.d. 2. Vitamin B12 1000 mcg injections weekly x 4, then monthly forever. 3. Megace 400 mg q.d. to help stimulate appetite. 4. Multivitamin q.d. 5. Cipro 500 mg one p.o. q.12h. through March 24 (total of 14-day therapy). 6. Capsaicin cream to knee t.i.d.  CONDITION  UPON DISCHARGE:  Stable.  PHYSICAL EXAMINATION AT DISCHARGE:  VITAL SIGNS:  Blood pressure 133/65, afebrile, heart rate 70-80, respiratory rate 20.  Discharge weight 60.3 kilograms (question accuracy).  DISPOSITION:  Home with family.  DISCHARGE INSTRUCTIONS:  Recommended activity:  As tolerated.  The patient required 24-hour supervision.  We will be obtaining a hospital bed, recliner, and tray table for him at home.  Recommended diet:  As tolerated.  I liberalized him to have sugar containing foods.  Felt nutrition was more important.  Ensure twice a day if not eating.  Wound care:  DuoDerm was placed on a small sacral area of breakdown.  [name] will recheck in the office in one week, and if still present would recommend weekly DuoDerm changes.  DISCHARGE FOLLOWUP:  [name] on Monday, March 18 at 9 oclock.  Check sacrum.  B12 shot.  Discuss referral to Dr. Isabel Caprice.  Discuss code status, benefit of Hospice involvement.  HOSPITAL COURSE: #1 - PSEUDOMONAS URINARY TRACT INFECTION:  [name] is a 77 year old Caucasian gentleman with a history of diet-controlled diabetes who has been on treatment of some sort for a urinary tract infection since January 2002.  Last week he had gross hematuria and presents with significant pyuria  and failure to thrive.  A urine culture was done and demonstrated Pseudomonas.  He was switched to Zosyn until sensitivities could be determined.  Fortunately, the organism is sensitive to Cipro, and he will go home to complete a total course of 14 days.  I discussed the case with urology who felt that they would not proceed with cystoscopy until acute infection resolved.  Before referral, I would discuss with the patient his wishes to undergo any kind of procedures including a cystoscopy.  If he declines, we would have to make sure and let urology know if referral was made.  #2 - FAILURE TO THRIVE AND WEIGHT LOSS:  [name] has declined  considerably over the past year.  There is suspicion that he has an underlying malignancy. He has a history of multiple colon polyps which were found two years ago by Dr. Loreta Ave.  Despite repeated efforts to get him to have a repeat colonoscopy, he has declined.  We did do a PSA this admission which was 2.99.  Other previous studies included an abdominal/pelvic CT in October 2001.  At that time, he had an enlarged prostate noted, but no other significant abnormalities.  I did do a chest x-ray here which showed no gross abnormalities.  My concerns for underlying malignancy would certainly include colonoscopy and possible bladder cancer with these recurrent urinary tract infections.  We did get Hospice involved this admission which I think is completely appropriate on a failure to thrive basis.  If [name] declines any further workup, I would not be surprised and certainly I doubt there would be any great treatment if we were to diagnose anything at this stage.  [name] has had multiple conversations with him concerning this and will continue to follow him as an outpatient.  I would consider B12 shots part of comfort measures, certainly in this gentlemen and would recommend starting and continuing them for the time being.  DISCHARGE LABORATORIES:  Sodium 135, potassium 3.6, chloride 101, bicarb 28, BUN 12, creatinine 0.6, glucose 142.  Hemoglobin 11.8, MCV 93, WBC 4500, platelet count 397. DD:  06/02/00 TD:  06/03/00 Job: [id number]

## 2010-08-10 NOTE — Discharge Summary (Signed)
NAME:  [name], [name] NO.:  [id number]   MEDICAL RECORD NO.:  [id number]          PATIENT TYPE:  INP   LOCATION:  3708                         FACILITY:  [facility]   PHYSICIAN:  Darcella Gasman. Ingold, N.P.  DATE OF BIRTH:  1920-01-22   DATE OF ADMISSION:  01/16/2006  DATE OF DISCHARGE:  01/20/2006                                 DISCHARGE SUMMARY   DISCHARGE DIAGNOSES:  1. Acute on chronic systolic heart failure, resolved.  2. Lower extremity edema, resolved.  3. Hypertension, improved control.  4. History of nephrolithiasis.  5. Vitamin B12 deficiency; dose given last night before discharge.  6. A history of permanent transvenous pacemaker.  7. Hypothyroidism.  8. Cellulitis of lower extremities; continue antibiotics.  9. Depression.   DISCHARGE CONDITION:  Improved.   PROCEDURES:  None.   DISCHARGE MEDICATIONS:  1. Aspirin 81 mg daily.  2. Plavix 75 daily.  3. Synthroid 50 mcg daily.  4. Benicar 20 daily.  5. Xanax 0.5 twice a day.  6. Zoloft; we increased it to 50 mg daily.  7. Keflex 250 mg four times a day for six more days.  8. Protonix 40 mg daily.  9. Furosemide 60 mg in the morning, 40 in the evening at 2 p.m.  10.K-Dur 40 mEq in the morning.  11.Vitamin B12 shot at primary MD on November 27.   DISCHARGE INSTRUCTIONS:  1. Two-gram sodium/fat-modified diet, record daily weight, stop any      activities that cause chest pain.  2. See the post heart failure orders.  3. Followup with Dr. Clarene Duke in two weeks.  The office will call you with      an appointment date and time.   HISTORY OF PRESENT ILLNESS:  A 77 year old patient was seen by Lezlie Octave  in the office and Dr. Alanda Amass after he called complaining of increasing  edema.  He had not wanted to come in on the day he made the phone call, but  he came in the next day.  His weight was up 8 pounds since last office  visit.  He had substantial lower extremity edema.  His lower extremities  continued to swell.  His daughter states he only sits in the chair all day,  legs down, and drinks 6-8 Pepsi's a day.  He has two-pillow orthopnea.  He  hardly gets up to go to the bathroom and a lot of times he just goes in a  trash can.   PAST MEDICAL HISTORY:  1. Coronary disease with an angio to the LAD in November of 2004.  2. Hypertension, poorly controlled.  Last echo was in 2004, EF was 55% to      65%.  3. Nephrolithiasis.  4. Valvular heart disease.  5. Mild AS.  6. Left atrial enlargement.  7. Vitamin B12 deficiency.  8. Hypothyroidism.  9. He did recently have circumcision in March of 2007 by Dr. Annabell Howells.   OUTPATIENT MEDS:  1. Plavix 75.  2. Synthroid 50.  3. Benicar 20.  4. Xanax 0.5 two a day.  5. Lasix 20  four daily.  6. Zoloft 25.  7. KCl 20.  8. Multivitamins.   ALLERGIES:  CODEINE, ATIVAN, and PHENERGAN.   FAMILY HISTORY/SOCIAL HISTORY/REVIEW OF SYSTEMS:  See H&P.  Please note, the  patient lives with his son in his son's house.  His wife is in a nursing  home and she has Alzheimer's, I believe, and she is much better off there,  but the patient is very debilitated, partly at his own request.   PHYSICAL EXAM AT DISCHARGE:  VITAL SIGNS:  Blood pressure 144/81, pulse 72.  GENERAL:  Alert and oriented white male, no complaints of shortness of  breath.  LUNGS:  Clear.  HEART:  Regular rate and rhythm.  No edema of lower extremities.   Patient wanted to keep his Foley catheter in because he was too lazy to walk  to the bathroom.  Dr. Clarene Duke offered to send him to a nursing home if that  would help him, but, at this point, the patient agreed to have the Foley  removed and to go home.   LABORATORY DATA:  Hemoglobin on admission 1.7, hematocrit 33.8, WBC 6.8,  platelets 167, neutrophils 70, lymphocytes 16, monocytes 10, eosinophils 3,  bands 1.  These remain stable.  Pro-time 14.3, INR 1.1, PTT 40.   Chemistry:  Sodium 142, potassium 3.4, chloride 109, CO2  28, glucose 125,  BUN 12, creatinine 1.1, and calcium 8.5.  Total protein 6.1, albumin 3.3,  AST 19, ALT 11, ALP 67, total bili 0.8, magnesium 1.9.  He was hypokalemic  at times and he was replaced at discharge.  His K was 4.3, glucose 129, BUN  25.  BNP 969.  It came down at discharge; his BNP was 110.   TSH 4.150.   Iron 48, TIBC 259, iron sat 19, UIBC 11, B12 185, serum folate 14.   Urinalysis had positive nitrites, Proteus mirabilis, and it was sensitive to  cephalexin so we kept him on the Keflex.   Chest x-ray on admission:  Mild cardiomegaly and probable mild interstitial  edema, stable since previous exam.  A 2-D echo was also done.  Overall left  ventricular systolic function was moderately decreased.  Hypokinesis of  anterior wall and septal dyssynergy probably contributed by conduction  abnormality.  Left ventricular wall thickness was mildly to moderately  increased with increased relative contribution of atrial contraction to left  ventricular filling.  Aortic valve thickness was mild to moderately  increased.  The valve area by VTI was 2.42 centimeters square, by Vmax was  2.01 centimeters square.  Mild to moderate mitral annular calcification.  Left atrium was mildly dilated.  There was trivial pericardial effusion  posterior to the heart.   HOSPITAL COURSE:  The patient was brought in, given intravenous Lasix, and  improved.  He was also put on a low-salt diet.  He admitted in the hospital  he had been eating a lot of salt on his food.  He continued to lose fluid,  and the plan had been to discharge him on October 28 after a 14-pound weight  loss.  We went to do that, the family felt he needed to stay another day,  partially because they could not accept him at that time.  He refused to  have his Foley catheter out because he was too lazy to get up to the  bathroom and, at first, he did not want home health.  He finally agreed to home health to come out, and then we  kept him overnight.  Dr. Clarene Duke  discussed what he needed to do to have a life.  We did increase his Zoloft  to 50 so that perhaps it would help his depression.  He was discharged on  January 20, 2006.      Darcella Gasman. Annie Paras, N.P.     LRI/MEDQ  D:  02/05/2006  T:  02/06/2006  Job:  [id number]   cc:   Thereasa Solo. Little, M.D.  [name], M.D.

## 2010-08-10 NOTE — Discharge Summary (Signed)
NAME:  [name], [name]                  ACCOUNT NO.:  [id number]   MEDICAL RECORD NO.:  [id number]          PATIENT TYPE:  INP   LOCATION:  4706                         FACILITY:  [facility]   PHYSICIAN:  Melissa L. Ladona Ridgel, MD  DATE OF BIRTH:  1920-01-10   DATE OF ADMISSION:  12/01/2003  DATE OF DISCHARGE:  12/06/2003                                 DISCHARGE SUMMARY   DISCHARGE DIAGNOSIS:  1.  Bacteremia:  The patient presented to the emergency room with left-sided      weakness, and left against medical advice, and was subsequently found to      have blood cultures that were positive for Morganella morgani, sensitive      to ceftriaxone.  He was treated with IV ceftriaxone during the      admission, and discharged home on ciprofloxacin.  2.  Weakness and left-sided focal neurological complaints:  These resolved      with treatment of his infection.  Dopplers were obtained, which showed      no evidence for stenosis to explain his symptomatology.  He was unable      to have an MRI completed, because of his pacemaker.  A CT of his head      did not show any acute stroke.  It was therefore assumed that the      patient's sepsis gave him the appearance of a possible stroke.  3.  Phimosis:  The patient has a severe case of phimosis which likely was      causing urinary retention.  Urology was consulted during the course of      the hospital stay, to place a Foley catheter.  This was discontinued      without difficulty at the time of discharge, and he is to follow up with      urology as an outpatient.  4.  B12 deficiency with anemia:  The patient was restarted on vitamin B12.  5.  History of subclavian thrombosis:  The patient was continued on Plavix      75 mg.  6.  Hypothyroidism:  The patient was continued on Synthroid 50 mcg daily.  7.  Depression:  The patient was restarted on Zoloft 25 mg p.o. q.h.s.  8.  Diabetes:  The patient was restarted on low-dose Glucotrol, and will      follow up  with his primary care physician regarding his blood sugar      level.  9.  Coronary artery disease:  The patient was continued on Imdur and      aspirin.  To follow up with his primary care physician.  10. Fever blisters to both lips with evidence for some nodular lesions:  The      patient is recommended to follow up with dermatology, because of his      history of tobacco chewing.  There was concern for a possible lip lesion      that perhaps represented a cancer, related to chewing tobacco.  This      needs to be ruled out.   DISCHARGE MEDICATIONS:  1.  Vitamin B12, 100 mg subcutaneously daily x4 more days, then 1000 mg      subcutaneously q. Monthly.  2.  Plavix 75 mg daily.  3.  Imdur 30 mg daily.  4.  Synthroid 50 mcg once daily.  5.  Aspirin 81 mg once daily.  6.  Zoloft 25 mg p.o. q.h.s.  7.  Lasix 20 mg p.o. once daily p.r.n.  We request that the patient hold off      on using this until he sees Dr. [name].  8.  Glucotrol 5 mg p.o. daily.  9.  Cipro 200 mg, one tab b.i.d. x2 weeks.  Pain management is not applicable.   DISCHARGE INSTRUCTIONS:  He was instructed not to take his stress-B, as he  would be restarted on subcutaneous vitamin B12 injections.   ACTIVITY:  The patient was instructed to walk with his walker, as he was  slightly unsteady in gait without it.   DIET:  He is to follow a carbohydrate-modified,1800 calorie diet.   FOLLOWUP:  1.  The patient is to call and make an appointment with Dr. Valetta Fuller,      to evaluate his phimosis.  2.  He is to make an appointment with [name] in one week.  3.  He is to obtain a referral for a dermatology consultation, to evaluate      his lip.  4.  Home health was arranged to evaluate the patient's living situation.   HISTORY OF PRESENT ILLNESS:  The patient is an 77 year old white male who  presented to the emergency room one day prior to this admission with  complaints of weakness, focally located  on his left side.  He had a partial  evaluation in the emergency room, and then promptly left against medical  advice.  His family returned him the next day for further evaluation, when  they found him lying on the floor, slightly confused.  On admission he was  found to be febrile and confused.   HOSPITAL COURSE:  His blood cultures had returned from the previous night's  admission positive for gram-negative rods.  These were further delineated  later in the visit to be Morganella morgani, which likely came from a  urinary tract infection.  The patient did show signs of urinary retention,  secondary to severe phimosis.  Therefore Dr. Isabel Caprice was kind enough to place  a Foley catheter during this admission.  The patient was seen and evaluated  on the telemetry floor and found to be slightly confused, but appropriate.  He was treated with IV ceftriaxone and rehydration.  His medications all  were continued from home.  Adult Protective Services did request a psychiatry consultation on this  patient; however, after treating his infection, it became clear that he was  oriented and competent to make decisions for himself.  A CT of the head was  performed to rule out possible stroke.  This was negative.  He was unable to  obtain an MRI as stated, because of his pacemaker.  His focal neurological  deficits seemed to resolve with hydration and treatment of his urinary tract  infection.  During the course of the hospital stay, the patient was noted to have  blisters on his lips, consistent with fever blisters.  Zovirax was ordered.  He also was noted to have areas of hyperpigmented lesions on the left, that  were suspicious for possible malignancy.  It was therefore recommended that  he follow  up with dermatology, since he does chew tobacco on a regular  basis.  The patient slowly recovered his appetite.  He did complain of some anxiety over having to leave his wife home alone, as she has dementia  and the  patient is her primary caregiver.  The patient worked with physical therapy at the end of his visit, and was  able to show a stable gait using his rolling walker.  He therefore was  referred to home PT and OT.  On the day of discharge, the patient was found  to be hemodynamically stable, afebrile, able to utilize the restroom, and  had some incontinence related to his phimosis, but this was controllable.  His lungs were noted to be clear to auscultation.  Cardiovascular is a  regular rate and rhythm.  Abdomen was soft and nontender, nondistended.  His  extremities showed trace to 1+ edema.   DISPOSITION:  The patient was deemed stable to follow up with [name]  and with Dr. Isabel Caprice as an outpatient.  Instructions were given to the  patient's daughter, Darl Pikes.  Home health was set up for further observation  of his home living situation.   CONDITION ON DISCHARGE:  Stable.      Meli   MLT/MEDQ  D:  12/26/2003  T:  12/26/2003  Job:  [id number]   cc:   [name], M.D.  [address] [place]  [place] [address]  Fax: [phone]   Valetta Fuller, M.D.  [address]  [place]  [place] [address]  Fax: [phone]

## 2010-08-10 NOTE — Discharge Summary (Signed)
NAMESEAMUS, WAREHIME NO.:  [id number]   MEDICAL RECORD NO.:  [id number]          PATIENT TYPE:  INP   LOCATION:  3703                         FACILITY:  [facility]   PHYSICIAN:  Michelene Gardener, MD    DATE OF BIRTH:  [birth year]   DATE OF ADMISSION:  07/14/2006  DATE OF DISCHARGE:  07/16/2006                               DISCHARGE SUMMARY   PRIMARY PHYSICIAN:  [name], M.D.   DISCHARGE DIAGNOSES:  1. Congestive heart failure exacerbation.  2. Urinary tract infection.  3. Hypertension.  4. Hypothyroidism.  5. Coronary artery disease.  6. Vitamin B12 deficiency.  7. Depression.  8. Obesity.   DISCHARGE MEDICATIONS:  Include:  1. Benicar.  The patient was advised not to take any Benicar until      April 26 and then to resume Benicar at 20 mg once daily.  2. Lasix 60 mg p.o. twice daily.  3. Synthroid 75 mcg p.o. once daily.  4. Plavix 75 mg p.o. once daily.  5. Coreg 12.5 mg p.o. once daily.  6. Zoloft 50 mg p.o. once daily.  7. Potassium chloride 40 mEq p.o. once daily.  8. Multivitamin 1 tab p.o. once daily.  9. Xanax 0.5 mg p.o. every 12 hours as needed.  10.Ciprofloxacin 250 mg 1 tab twice daily for 4 days.   CONSULTATIONS:  None.   PROCEDURES:  None.   FOLLOWUP APPOINTMENT:  Primary physician in 1-2 weeks.   COURSE OF HOSPITALIZATION:  1. Congestive heart failure:  This patient was admitted to telemetry      floor.  Three sets of troponin and cardiac enzymes were done, and      they came to be normal.  Lasix was switched to IV at 80 mg twice      daily during the hospitalization, and then it was adjusted to 60 mg      twice daily to be taken p.o. at the time of discharge.  The patient      was continued on Coreg.  Benicar was continued and, as mentioned      above, to be held for a few days, and then it would be resumed      secondary to hypotension.  The patient was also put on 2-3 liters      of oxygen via nasal cannula, and nebulizer  treatment was done      during the hospitalization.  At the time of discharge, the patient      was stable, and saturation was well, and there is no shortness of      breath.  2. Urinary tract infection:  The patient was started on ciprofloxacin      during the hospitalization and was continued on 4 more days of      antibiotics after discharge.  3. Hypertension:  As mentioned, some adjustments were made to her      medications, including change in Lasix to 60 mg twice daily and      holding of her Benicar until April 26 and then to be resumed at the  same dose.  Otherwise, other hypertensive medications were      continued.  4. Hypothyroidism:  This patient has TSH level done during the      hospitalization, and it came to be a higher level at 8.  Her      Synthroid was increased to 75 mcg, and she was recommended to      repeat her TSH in 3 months.  5. Coronary artery disease:  Same medications were continued.   DISPOSITION:  Otherwise, other medical problems were stable, and the  patient was maintained on the above medications taken at home.   TOTAL ASSESSMENT TIME:  40 minutes.      Michelene Gardener, MD  Electronically Signed     NAE/MEDQ  D:  10/01/2006  T:  10/02/2006  Job:  [id number]   cc:   [name], M.D.

## 2010-08-10 NOTE — Op Note (Signed)
NAME:  [name], [name] NO.:  [id number]   MEDICAL RECORD NO.:  [id number]            PATIENT TYPE:   LOCATION:                                 FACILITY:   PHYSICIAN:  Excell Seltzer. Annabell Howells, M.D.         DATE OF BIRTH:   DATE OF PROCEDURE:  06/21/2005  DATE OF DISCHARGE:                                 OPERATIVE REPORT   PROCEDURE:  Circumcision.   PREOPERATIVE DIAGNOSIS:  Phimosis.   POSTOPERATIVE DIAGNOSIS:  Phimosis.   SURGEON:  Excell Seltzer. Annabell Howells, M.D.   ANESTHESIA:  General.   SPECIMEN:  Foreskin, not sent to path lab.   DRAINS:  Foley catheter.   COMPLICATIONS:  None.   INDICATIONS:  Mr. Soltis is an 77 year old white male with severe phimosis  that was making voiding difficult.  He comes in today for circumcision.   FINDINGS AND PROCEDURE:  The patient was taken operating room.  He received  a gram of Ancef.  General anesthetic was induced.  His genitalia was prepped  with Betadine solution.  He was draped with sterile towels.  A dorsal slit  was performed after crushing the tissue with a straight hemostat the  redundant wings of foreskin were excised.  Hemostasis was achieved with the  Bovie.  The skin edges were reapproximated with running 4-0 chromic sutures  locked in the quadrants.  A Foley catheter was reinserted and a dressing of  Xeroform gauze, Kling, and Coban was applied.  The patient's anesthetic was  reversed and he was moved to the recovery room in stable condition.  There  were no complications.      Excell Seltzer. Annabell Howells, M.D.  Electronically Signed    JJW/MEDQ  D:  06/21/2005  T:  06/23/2005  Job:  [id number]   cc:   Gordy Savers, M.D. [facility]  [address] [place]  [place] [address]

## 2010-09-28 ENCOUNTER — Encounter: Payer: Self-pay | Admitting: Internal Medicine

## 2010-10-10 ENCOUNTER — Encounter: Payer: Self-pay | Admitting: *Deleted

## 2010-10-15 ENCOUNTER — Ambulatory Visit (INDEPENDENT_AMBULATORY_CARE_PROVIDER_SITE_OTHER): Payer: Medicare Other | Admitting: *Deleted

## 2010-10-15 DIAGNOSIS — I442 Atrioventricular block, complete: Secondary | ICD-10-CM

## 2010-12-24 LAB — URINALYSIS, ROUTINE W REFLEX MICROSCOPIC
Glucose, UA: NEGATIVE
Ketones, ur: 15 — AB
Nitrite: NEGATIVE
Protein, ur: 100 — AB
Urobilinogen, UA: >8 — ABNORMAL HIGH

## 2010-12-24 LAB — BASIC METABOLIC PANEL
BUN: 66 — ABNORMAL HIGH
BUN: 67 — ABNORMAL HIGH
BUN: 16
BUN: 24 — ABNORMAL HIGH
BUN: 54 — ABNORMAL HIGH
BUN: 9
CO2: 13 — ABNORMAL LOW
CO2: 18 — ABNORMAL LOW
CO2: 20
CO2: 24
CO2: 25
CO2: 26
CO2: 26
Calcium: 6.8 — ABNORMAL LOW
Calcium: 7.3 — ABNORMAL LOW
Calcium: 8 — ABNORMAL LOW
Calcium: 8.6
Chloride: 108
Chloride: 108
Chloride: 97
Chloride: 110
Chloride: 112
Chloride: 113 — ABNORMAL HIGH
Creatinine, Ser: 2.33 — ABNORMAL HIGH
Creatinine, Ser: 2.36 — ABNORMAL HIGH
Creatinine, Ser: 2.67 — ABNORMAL HIGH
Creatinine, Ser: 1.09
Creatinine, Ser: 1.29
Creatinine, Ser: 1.75 — ABNORMAL HIGH
GFR calc Af Amer: 27 — ABNORMAL LOW
GFR calc Af Amer: 28 — ABNORMAL LOW
GFR calc Af Amer: 32 — ABNORMAL LOW
GFR calc Af Amer: >60
GFR calc non Af Amer: 53 — ABNORMAL LOW
GFR calc non Af Amer: 56 — ABNORMAL LOW
GFR calc non Af Amer: >60
Glucose, Bld: 134 — ABNORMAL HIGH
Glucose, Bld: 139 — ABNORMAL HIGH
Glucose, Bld: 286 — ABNORMAL HIGH
Glucose, Bld: 188 — ABNORMAL HIGH
Glucose, Bld: 77
Potassium: 4.5
Potassium: 4.7
Potassium: 5.6 — ABNORMAL HIGH
Sodium: 136
Sodium: 142

## 2010-12-24 LAB — POCT I-STAT 3, ART BLOOD GAS (G3+)
Acid-base deficit: 13 — ABNORMAL HIGH
Acid-base deficit: 4 — ABNORMAL HIGH
Bicarbonate: 19.3 — ABNORMAL LOW
O2 Saturation: 96
O2 Saturation: 99
Patient temperature: 97.5
TCO2: 14
TCO2: 21
TCO2: 20
pCO2 arterial: 28.7 — ABNORMAL LOW
pCO2 arterial: 32 — ABNORMAL LOW
pCO2 arterial: 32.6 — ABNORMAL LOW
pCO2 arterial: 30.2 — ABNORMAL LOW
pH, Arterial: 7.358
pH, Arterial: 7.412
pO2, Arterial: 81
pO2, Arterial: 70 — ABNORMAL LOW

## 2010-12-24 LAB — GLUCOSE, CAPILLARY
Glucose-Capillary: 108 — ABNORMAL HIGH
Glucose-Capillary: 115 — ABNORMAL HIGH
Glucose-Capillary: 151 — ABNORMAL HIGH
Glucose-Capillary: 227 — ABNORMAL HIGH
Glucose-Capillary: 240 — ABNORMAL HIGH
Glucose-Capillary: 252 — ABNORMAL HIGH
Glucose-Capillary: 299 — ABNORMAL HIGH
Glucose-Capillary: 442 — ABNORMAL HIGH
Glucose-Capillary: 444 — ABNORMAL HIGH
Glucose-Capillary: 101 — ABNORMAL HIGH
Glucose-Capillary: 108 — ABNORMAL HIGH
Glucose-Capillary: 119 — ABNORMAL HIGH
Glucose-Capillary: 125 — ABNORMAL HIGH
Glucose-Capillary: 132 — ABNORMAL HIGH
Glucose-Capillary: 142 — ABNORMAL HIGH
Glucose-Capillary: 153 — ABNORMAL HIGH
Glucose-Capillary: 157 — ABNORMAL HIGH
Glucose-Capillary: 162 — ABNORMAL HIGH
Glucose-Capillary: 173 — ABNORMAL HIGH
Glucose-Capillary: 184 — ABNORMAL HIGH
Glucose-Capillary: 198 — ABNORMAL HIGH
Glucose-Capillary: 207 — ABNORMAL HIGH
Glucose-Capillary: 65 — ABNORMAL LOW
Glucose-Capillary: 75
Glucose-Capillary: 95

## 2010-12-24 LAB — CARBOXYHEMOGLOBIN
Carboxyhemoglobin: 0.8
Carboxyhemoglobin: 1
Methemoglobin: 0.7
O2 Saturation: 54.7
O2 Saturation: 71.6
O2 Saturation: 71.8
Total hemoglobin: 12.3 — ABNORMAL LOW

## 2010-12-24 LAB — COMPREHENSIVE METABOLIC PANEL
ALT: 32
ALT: 15
ALT: 21
AST: 20
AST: 21
AST: 33
BUN: 64 — ABNORMAL HIGH
CO2: 21
CO2: 25
CO2: 23
CO2: 25
Calcium: 7.5 — ABNORMAL LOW
Calcium: 8.3 — ABNORMAL LOW
Calcium: 7.2 — ABNORMAL LOW
Calcium: 8.2 — ABNORMAL LOW
Chloride: 99
Chloride: 113 — ABNORMAL HIGH
Creatinine, Ser: 2.45 — ABNORMAL HIGH
Creatinine, Ser: 2.56 — ABNORMAL HIGH
Creatinine, Ser: 2.78 — ABNORMAL HIGH
GFR calc Af Amer: 26 — ABNORMAL LOW
GFR calc Af Amer: 29 — ABNORMAL LOW
GFR calc Af Amer: 37 — ABNORMAL LOW
GFR calc Af Amer: 56 — ABNORMAL LOW
GFR calc non Af Amer: 24 — ABNORMAL LOW
GFR calc non Af Amer: 25 — ABNORMAL LOW
GFR calc non Af Amer: 30 — ABNORMAL LOW
GFR calc non Af Amer: 46 — ABNORMAL LOW
Glucose, Bld: 205 — ABNORMAL HIGH
Glucose, Bld: 186 — ABNORMAL HIGH
Potassium: 3.1 — ABNORMAL LOW
Sodium: 138
Sodium: 138
Sodium: 143
Total Bilirubin: 0.9
Total Bilirubin: 0.7
Total Protein: 5.4 — ABNORMAL LOW
Total Protein: 6.4

## 2010-12-24 LAB — DIFFERENTIAL
Basophils Absolute: 0
Eosinophils Absolute: 0
Eosinophils Relative: 0
Lymphocytes Relative: 3 — ABNORMAL LOW
Lymphocytes Relative: 7 — ABNORMAL LOW
Lymphocytes Relative: 7 — ABNORMAL LOW
Lymphs Abs: 0.4 — ABNORMAL LOW
Lymphs Abs: 0.5 — ABNORMAL LOW
Monocytes Absolute: 0.5
Monocytes Relative: 4
Monocytes Relative: 7
Neutro Abs: 4.4
Neutro Abs: 5.6
Neutrophils Relative %: 89 — ABNORMAL HIGH
Neutrophils Relative %: 93 — ABNORMAL HIGH

## 2010-12-24 LAB — CK TOTAL AND CKMB (NOT AT ARMC)
CK, MB: 6.8 — ABNORMAL HIGH
Total CK: 114

## 2010-12-24 LAB — CBC
HCT: 35.1 — ABNORMAL LOW
HCT: 38.2 — ABNORMAL LOW
Hemoglobin: 12 — ABNORMAL LOW
Hemoglobin: 13.4
Hemoglobin: 10.8 — ABNORMAL LOW
Hemoglobin: 11 — ABNORMAL LOW
MCHC: 33.8
MCHC: 34
MCHC: 34.1
MCHC: 35
MCHC: 33.6
MCHC: 34
MCHC: 34.4
MCV: 89.5
MCV: 91
MCV: 91.7
MCV: 91.7
Platelets: 180
RBC: 3.78 — ABNORMAL LOW
RBC: 3.82 — ABNORMAL LOW
RBC: 4.27
RBC: 4.32
RBC: 3.09 — ABNORMAL LOW
RBC: 3.48 — ABNORMAL LOW
RBC: 3.56 — ABNORMAL LOW
RDW: 13.3
RDW: 13.9
RDW: 14.3
WBC: 4.9
WBC: 6.3
WBC: 7.3
WBC: 8.5

## 2010-12-24 LAB — LIPASE, BLOOD: Lipase: 20

## 2010-12-24 LAB — POTASSIUM
Potassium: 5.8 — ABNORMAL HIGH
Potassium: 5.9 — ABNORMAL HIGH
Potassium: 6.6

## 2010-12-24 LAB — PHOSPHORUS
Phosphorus: 4.8 — ABNORMAL HIGH
Phosphorus: 5.4 — ABNORMAL HIGH

## 2010-12-24 LAB — TYPE AND SCREEN
ABO/RH(D): A NEG
Antibody Screen: NEGATIVE

## 2010-12-24 LAB — CULTURE, BLOOD (ROUTINE X 2)

## 2010-12-24 LAB — ABO/RH: ABO/RH(D): A NEG

## 2010-12-24 LAB — PROTIME-INR
INR: 1.1
INR: 1.2
Prothrombin Time: 16 — ABNORMAL HIGH

## 2010-12-24 LAB — POCT CARDIAC MARKERS
CKMB, poc: 5.7
Myoglobin, poc: >500
Troponin i, poc: <0.05

## 2010-12-24 LAB — URINE CULTURE: Colony Count: >=100000

## 2010-12-24 LAB — CORTISOL: Cortisol, Plasma: 41

## 2010-12-24 LAB — MAGNESIUM
Magnesium: 2.1
Magnesium: 2.5
Magnesium: 2.6 — ABNORMAL HIGH

## 2010-12-24 LAB — URINE MICROSCOPIC-ADD ON

## 2010-12-24 LAB — SEDIMENTATION RATE: Sed Rate: 84 — ABNORMAL HIGH

## 2010-12-24 LAB — B-NATRIURETIC PEPTIDE (CONVERTED LAB)
Pro B Natriuretic peptide (BNP): 1236 — ABNORMAL HIGH
Pro B Natriuretic peptide (BNP): 874 — ABNORMAL HIGH

## 2010-12-24 LAB — HEMOGLOBIN A1C: Hgb A1c MFr Bld: 9.7 — ABNORMAL HIGH

## 2010-12-24 LAB — SODIUM, URINE, RANDOM: Sodium, Ur: 26

## 2010-12-24 LAB — APTT: aPTT: 39 — ABNORMAL HIGH

## 2010-12-28 LAB — BASIC METABOLIC PANEL
BUN: 17
Calcium: 9.1
Creatinine, Ser: 1.23
GFR calc non Af Amer: 56 — ABNORMAL LOW
Potassium: 4.1

## 2010-12-28 LAB — CBC
HCT: 39.2
MCV: 90.3
Platelets: 177
RDW: 13.2

## 2010-12-28 LAB — COMPREHENSIVE METABOLIC PANEL
Albumin: 4
BUN: 18
Chloride: 100
Creatinine, Ser: 1.29
Total Bilirubin: 0.9

## 2010-12-28 LAB — HEMOGLOBIN A1C: Mean Plasma Glucose: 193

## 2010-12-28 LAB — TSH: TSH: 4.287

## 2011-01-01 LAB — BASIC METABOLIC PANEL
BUN: 28 — ABNORMAL HIGH
Calcium: 8.4
Creatinine, Ser: 1.91 — ABNORMAL HIGH
GFR calc non Af Amer: 34 — ABNORMAL LOW
Potassium: 4.7

## 2011-01-01 LAB — B-NATRIURETIC PEPTIDE (CONVERTED LAB): Pro B Natriuretic peptide (BNP): 254 — ABNORMAL HIGH

## 2011-01-02 LAB — COMPREHENSIVE METABOLIC PANEL
Albumin: 3.6
Alkaline Phosphatase: 69
BUN: 18
Calcium: 9
Potassium: 4.3
Total Protein: 6.3

## 2011-01-02 LAB — I-STAT 8, (EC8 V) (CONVERTED LAB)
Acid-Base Excess: 4 — ABNORMAL HIGH
Bicarbonate: 29.4 — ABNORMAL HIGH
HCT: 39
Operator id: 198171
TCO2: 31
pCO2, Ven: 46.8

## 2011-01-02 LAB — DIFFERENTIAL
Basophils Relative: 2 — ABNORMAL HIGH
Lymphs Abs: 0.9
Monocytes Relative: 7
Neutro Abs: 3.2
Neutrophils Relative %: 67

## 2011-01-02 LAB — BASIC METABOLIC PANEL
Calcium: 8.6
Calcium: 8.9
Creatinine, Ser: 2.18 — ABNORMAL HIGH
GFR calc Af Amer: 30 — ABNORMAL LOW
GFR calc Af Amer: 35 — ABNORMAL LOW
GFR calc non Af Amer: 24 — ABNORMAL LOW
GFR calc non Af Amer: 29 — ABNORMAL LOW
Glucose, Bld: 164 — ABNORMAL HIGH
Potassium: 4
Sodium: 137
Sodium: 140

## 2011-01-02 LAB — CK TOTAL AND CKMB (NOT AT ARMC)
CK, MB: 3.2
Relative Index: INVALID
Total CK: 75

## 2011-01-02 LAB — CBC
Platelets: 122 — ABNORMAL LOW
RBC: 4.14 — ABNORMAL LOW
WBC: 4.8

## 2011-01-02 LAB — B-NATRIURETIC PEPTIDE (CONVERTED LAB): Pro B Natriuretic peptide (BNP): 148 — ABNORMAL HIGH

## 2011-01-02 LAB — POCT CARDIAC MARKERS
CKMB, poc: 1.9
Myoglobin, poc: 161
Troponin i, poc: <0.05

## 2011-01-02 LAB — CARDIAC PANEL(CRET KIN+CKTOT+MB+TROPI)
CK, MB: 2
Relative Index: INVALID
Relative Index: INVALID
Total CK: 66
Troponin I: 0.02

## 2011-01-02 LAB — PROTIME-INR
INR: 1
Prothrombin Time: 13.2

## 2011-01-02 LAB — TROPONIN I: Troponin I: 0.03

## 2011-01-02 LAB — HEMOGLOBIN A1C
Hgb A1c MFr Bld: 7.8 — ABNORMAL HIGH
Mean Plasma Glucose: 200

## 2011-01-02 LAB — APTT: aPTT: 29

## 2011-01-09 LAB — BASIC METABOLIC PANEL
BUN: 12
BUN: 27 — ABNORMAL HIGH
CO2: 33 — ABNORMAL HIGH
CO2: 33 — ABNORMAL HIGH
CO2: 35 — ABNORMAL HIGH
Calcium: 8.4
Calcium: 8.5
Calcium: 8.7
Chloride: 94 — ABNORMAL LOW
Chloride: 99
Creatinine, Ser: 1.26
Creatinine, Ser: 1.28
Creatinine, Ser: 1.43
GFR calc Af Amer: 48 — ABNORMAL LOW
GFR calc Af Amer: >60
GFR calc Af Amer: >60
GFR calc non Af Amer: 39 — ABNORMAL LOW
GFR calc non Af Amer: 47 — ABNORMAL LOW
Glucose, Bld: 112 — ABNORMAL HIGH
Glucose, Bld: 138 — ABNORMAL HIGH
Glucose, Bld: 143 — ABNORMAL HIGH
Potassium: 4
Potassium: 4.2
Sodium: 134 — ABNORMAL LOW
Sodium: 137

## 2011-01-09 LAB — I-STAT 8, (EC8 V) (CONVERTED LAB)
BUN: 10
Bicarbonate: 32 — ABNORMAL HIGH
HCT: 33 — ABNORMAL LOW
Operator id: 234501
TCO2: 34
pCO2, Ven: 57.4 — ABNORMAL HIGH

## 2011-01-09 LAB — DIFFERENTIAL
Eosinophils Absolute: 0.1
Lymphocytes Relative: 18
Lymphs Abs: 0.8
Monocytes Relative: 10
Neutro Abs: 2.9
Neutrophils Relative %: 68

## 2011-01-09 LAB — URINALYSIS, ROUTINE W REFLEX MICROSCOPIC
Ketones, ur: NEGATIVE
Nitrite: NEGATIVE
Protein, ur: 100 — AB
pH: 7

## 2011-01-09 LAB — CBC
HCT: 33.8 — ABNORMAL LOW
Platelets: 151
Platelets: 190
RBC: 3.57 — ABNORMAL LOW
RDW: 13.3
WBC: 4.3

## 2011-01-09 LAB — POCT I-STAT 3, VENOUS BLOOD GAS (G3P V)
Bicarbonate: 26.1 — ABNORMAL HIGH
O2 Saturation: 68
TCO2: 27
pCO2, Ven: 46.8
pCO2, Ven: 52.1 — ABNORMAL HIGH
pH, Ven: 7.38 — ABNORMAL HIGH
pO2, Ven: 38
pO2, Ven: 38

## 2011-01-09 LAB — LIPID PANEL
HDL: 24 — ABNORMAL LOW
LDL Cholesterol: 132 — ABNORMAL HIGH
Triglycerides: 167 — ABNORMAL HIGH
VLDL: 33

## 2011-01-09 LAB — POCT CARDIAC MARKERS
CKMB, poc: 1.7
Myoglobin, poc: 149
Operator id: 234501
Troponin i, poc: <0.05

## 2011-01-09 LAB — CK TOTAL AND CKMB (NOT AT ARMC): CK, MB: 3

## 2011-01-09 LAB — POCT I-STAT 3, ART BLOOD GAS (G3+)
Bicarbonate: 27.3 — ABNORMAL HIGH
Operator id: 118911
TCO2: 29
pH, Arterial: 7.421
pO2, Arterial: 73 — ABNORMAL LOW

## 2011-01-09 LAB — HEPATIC FUNCTION PANEL
ALT: 23
Albumin: 3.5
Indirect Bilirubin: 0.5
Total Protein: 7

## 2011-01-09 LAB — URINE MICROSCOPIC-ADD ON

## 2011-01-09 LAB — POCT I-STAT CREATININE: Creatinine, Ser: 1.2

## 2011-01-09 LAB — APTT: aPTT: 36

## 2011-01-09 LAB — HEMOGLOBIN A1C: Hgb A1c MFr Bld: 6.5 — ABNORMAL HIGH

## 2011-01-09 LAB — CARDIAC PANEL(CRET KIN+CKTOT+MB+TROPI)
Relative Index: INVALID
Relative Index: INVALID
Troponin I: 0.03

## 2011-01-09 LAB — B-NATRIURETIC PEPTIDE (CONVERTED LAB)
Pro B Natriuretic peptide (BNP): 768 — ABNORMAL HIGH
Pro B Natriuretic peptide (BNP): 953 — ABNORMAL HIGH

## 2011-01-09 LAB — TSH: TSH: 7.54 — ABNORMAL HIGH

## 2011-01-09 LAB — PROTIME-INR: INR: 1.1

## 2011-03-04 ENCOUNTER — Encounter: Payer: Self-pay | Admitting: Internal Medicine

## 2011-03-04 ENCOUNTER — Ambulatory Visit (INDEPENDENT_AMBULATORY_CARE_PROVIDER_SITE_OTHER): Payer: Medicare Other | Admitting: *Deleted

## 2011-03-04 DIAGNOSIS — I442 Atrioventricular block, complete: Secondary | ICD-10-CM

## 2011-03-04 LAB — PACEMAKER DEVICE OBSERVATION
AL AMPLITUDE: 1.4 mv
AL THRESHOLD: 1 V
BATTERY VOLTAGE: 2.73 V
RV LEAD THRESHOLD: 1 V

## 2011-03-21 ENCOUNTER — Ambulatory Visit: Payer: Self-pay | Admitting: Internal Medicine

## 2011-03-21 ENCOUNTER — Encounter: Payer: Medicare Other | Admitting: *Deleted

## 2011-04-04 ENCOUNTER — Encounter: Payer: Self-pay | Admitting: Internal Medicine

## 2011-05-21 ENCOUNTER — Ambulatory Visit (INDEPENDENT_AMBULATORY_CARE_PROVIDER_SITE_OTHER): Payer: Medicare Other | Admitting: Internal Medicine

## 2011-05-21 ENCOUNTER — Encounter: Payer: Self-pay | Admitting: Internal Medicine

## 2011-05-21 DIAGNOSIS — I4892 Unspecified atrial flutter: Secondary | ICD-10-CM

## 2011-05-21 DIAGNOSIS — I498 Other specified cardiac arrhythmias: Secondary | ICD-10-CM

## 2011-05-21 DIAGNOSIS — I5022 Chronic systolic (congestive) heart failure: Secondary | ICD-10-CM

## 2011-05-21 DIAGNOSIS — I442 Atrioventricular block, complete: Secondary | ICD-10-CM

## 2011-05-21 DIAGNOSIS — Z950 Presence of cardiac pacemaker: Secondary | ICD-10-CM | POA: Insufficient documentation

## 2011-05-21 LAB — PACEMAKER DEVICE OBSERVATION
AL AMPLITUDE: 2 mv
BATTERY VOLTAGE: 2.72 V
RV LEAD IMPEDENCE PM: 697 Ohm
VENTRICULAR PACING PM: 99

## 2011-05-21 NOTE — Assessment & Plan Note (Signed)
The patient's device was interrogated.  The information was reviewed. No changes were made in the programming.    His device is approaching ERI. We'll plan to see him every 3 months as his device dependent

## 2011-05-21 NOTE — Assessment & Plan Note (Signed)
Stable post pacer 

## 2011-05-21 NOTE — Assessment & Plan Note (Addendum)
He is modest brawny edema; we will begin him on Lasix 20 mg daily and to have his metabolic profile checked in 2 weeks. This will be done at his rehabilitation facility  He is modest renal insufficiency when last measured 2 years ago

## 2011-05-21 NOTE — Progress Notes (Signed)
HPI  [name] is a 78 y.o. male seen in followup for pacemaker implanted years ago for bradycardia further complicated by complete heart block. His ischemic heart disease with modest depression of LV systolic function at 40% and prior intervention of his LAD.  He has a history of recurrent and significant orthostatic lightheadedness resulting in multiple falls. He is currently a nursing home  He has a recent intercurrent illness characterized by fever and significant cough is gradually getting better.  The patient denies chest pain shortness of breath or palpitations. He does have some peripheral edema. He also has a number of different types of skin lesions for which she is seeing a dermatologist   Past Medical History  Diagnosis Date  . Altered mental status     felt to be secondary to acute delirium on top of a suspected underlying dementia  . Fall     Fall with pelvic fracture  . Acute renal failure     from dehydration brought on by fall  . Pelvic fracture   . Depression   . History of congestive heart failure     with and ejection fraction of 40% to 45% and mild aortic stenosis  . Ischemic cardiomyopathy     History of ischemic cardiomyopathy  . Coronary artery disease   . History of hypertension   . History of obesity   . Obesity   . Hypothyroidism   . B12 deficiency   . Mild aortic stenosis   . Dementia     Questionable dementia    Past Surgical History  Procedure Date  . Cholecystectomy   . Hemiarthroplasty hip Aug 07, 2004    Left  . Circumcision June 21, 2005    Current Outpatient Prescriptions  Medication Sig Dispense Refill  . carvedilol (COREG) 3.125 MG tablet Take 3.125 mg by mouth 2 (two) times daily with a meal.        . Cholecalciferol 5000 UNITS TABS Take 50,000 Units by mouth every 30 (thirty) days.        . clopidogrel (PLAVIX) 75 MG tablet Take 75 mg by mouth daily.        . Cyanocobalamin (VITAMIN B-12 IJ) Inject 1,000 mcg as directed  every 30 (thirty) days.        . ferrous sulfate 325 (65 FE) MG tablet Take 325 mg by mouth daily with breakfast.        . folic acid (FOLVITE) 0.5 MG tablet Take 1 mg by mouth daily.        Marland Kitchen glimepiride (AMARYL) 4 MG tablet Take 4 mg by mouth daily before breakfast.        . levothyroxine (SYNTHROID, LEVOTHROID) 100 MCG tablet Take 100 mcg by mouth daily.        . Multiple Vitamins-Minerals (CERTAGEN) tablet Take 1 tablet by mouth daily.        . sertraline (ZOLOFT) 50 MG tablet Take 50 mg by mouth daily.        . simvastatin (ZOCOR) 20 MG tablet Take 20 mg by mouth at bedtime.        . ALPRAZolam (XANAX) 0.25 MG tablet Take 0.25 mg by mouth as needed.        . metoCLOPramide (REGLAN) 5 MG tablet Take 5 mg by mouth daily.          Allergies  Allergen Reactions  . Codeine     REACTION: unknown  . Lorazepam     REACTION: unkown  .  Promethazine Hcl     REACTION: unknown    Review of Systems negative except from HPI and PMH  Physical Exam BP 130/74  Pulse 89  Ht 5' 10.5" (1.791 m)  Wt 206 lb (93.441 kg)  BMI 29.14 kg/m2 Well developed and well nourished in no acute distress sitting in a wheelchair HENT normal Neck Supple JVP flat Clear to ausculation Regular rate and rhythm 2/6 murmur at the right upper sternal border  Soft with active bowel sounds No clubbing cyanosis 2+ Edema Alert and oriented, grossly normal motor and sensory function Skin Warm and Dry with multiple lesions   Assessment and  Plan

## 2011-05-21 NOTE — Patient Instructions (Addendum)
Your physician recommends that you schedule a follow-up appointment in: 3 months with the device clinic  

## 2011-11-08 ENCOUNTER — Ambulatory Visit (INDEPENDENT_AMBULATORY_CARE_PROVIDER_SITE_OTHER): Payer: Medicare Other | Admitting: *Deleted

## 2011-11-08 ENCOUNTER — Encounter: Payer: Self-pay | Admitting: Internal Medicine

## 2011-11-08 ENCOUNTER — Telehealth: Payer: Self-pay | Admitting: Internal Medicine

## 2011-11-08 DIAGNOSIS — I442 Atrioventricular block, complete: Secondary | ICD-10-CM

## 2011-11-08 DIAGNOSIS — I4892 Unspecified atrial flutter: Secondary | ICD-10-CM

## 2011-11-08 LAB — PACEMAKER DEVICE OBSERVATION
AL AMPLITUDE: 1 mv
AL IMPEDENCE PM: 560 Ohm
AL THRESHOLD: 1 V
BATTERY VOLTAGE: 2.7 V

## 2011-11-08 NOTE — Progress Notes (Signed)
Pacer check

## 2011-11-08 NOTE — Telephone Encounter (Signed)
New msg Pt's daughter called to say she has a lot of swelling. She is concerned  Please call

## 2011-11-08 NOTE — Telephone Encounter (Signed)
I spoke with the patient's daughter. She states the patient has been having increased swelling. He lives at [facility] and she states that he had a chest x-ray that shows mild CHF and possible pneumonia. His lasix has been adjusted by [facility]. Per the daughter, the patient's breathing seems ok, but he just looks swollen. He was last seen in 2/13 with Dr. Graciela Husbands and was to have every 3 month checks on his PPM due to approaching ERI. He has not been checked since then. The patient cannot come to the office today for a device check due to transportation from [facility]. I discussed this with Belenda Cruise in device clinic. She is going to [facility] this afternoon. She will stop by [facility] and check his device to see if he is at [facility]. The patient's daughter is aware and agreeable.

## 2011-11-11 ENCOUNTER — Telehealth: Payer: Self-pay | Admitting: Internal Medicine

## 2011-11-11 NOTE — Telephone Encounter (Signed)
Daughter called because she said pt needs his pacemaker check , and that pt has stomach hard as a brick. Patient's daughter states patient denies SOB or stomach discomfort. According to [employer]  device clinic, pt's pacemaker was check this past 11/09/11 Friday at [facility], and nurse there said pt has no C/O . Pt was started on antibiotics for pneumonia, and increased lasix medication on last Tuesday for increase swelling. Daughter aware, she verbalized understanding.

## 2011-11-11 NOTE — Telephone Encounter (Signed)
Pt stomach is extended and hard as a brick and daughter is concerned and wants to talk to someone regarding this and find out what to do. She also moved his device appt up and I told her she may not need to do that & I would have someone to call

## 2011-12-23 ENCOUNTER — Encounter: Payer: Self-pay | Admitting: Internal Medicine

## 2011-12-23 ENCOUNTER — Ambulatory Visit (INDEPENDENT_AMBULATORY_CARE_PROVIDER_SITE_OTHER): Payer: Medicare Other | Admitting: *Deleted

## 2011-12-23 DIAGNOSIS — I442 Atrioventricular block, complete: Secondary | ICD-10-CM

## 2011-12-23 LAB — PACEMAKER DEVICE OBSERVATION
BATTERY VOLTAGE: 2.7 V
RV LEAD IMPEDENCE PM: 715 Ohm
VENTRICULAR PACING PM: 99.4

## 2011-12-23 NOTE — Progress Notes (Signed)
PPM battery check only. 

## 2012-01-22 ENCOUNTER — Ambulatory Visit (INDEPENDENT_AMBULATORY_CARE_PROVIDER_SITE_OTHER): Payer: Medicare Other | Admitting: *Deleted

## 2012-01-22 DIAGNOSIS — I442 Atrioventricular block, complete: Secondary | ICD-10-CM

## 2012-01-22 DIAGNOSIS — I4892 Unspecified atrial flutter: Secondary | ICD-10-CM

## 2012-01-22 DIAGNOSIS — I5022 Chronic systolic (congestive) heart failure: Secondary | ICD-10-CM

## 2012-01-27 DIAGNOSIS — R0989 Other specified symptoms and signs involving the circulatory and respiratory systems: Secondary | ICD-10-CM

## 2012-01-27 NOTE — Progress Notes (Signed)
Pacer interrogation only for battery check  

## 2012-02-06 LAB — PACEMAKER DEVICE OBSERVATION
AL IMPEDENCE PM: 550 Ohm
RV LEAD IMPEDENCE PM: 682 Ohm

## 2012-02-07 ENCOUNTER — Institutional Professional Consult (permissible substitution): Payer: Medicare Other | Admitting: Cardiovascular Disease

## 2012-02-24 ENCOUNTER — Encounter: Payer: Medicare Other | Admitting: *Deleted

## 2012-02-25 ENCOUNTER — Telehealth: Payer: Self-pay | Admitting: Internal Medicine

## 2012-02-25 NOTE — Telephone Encounter (Signed)
Spoke w/daughter and will run by and check pt's ppm/kwm

## 2012-02-25 NOTE — Telephone Encounter (Signed)
Pt's dtr wants to know if you can go by and check pt's pacer at [facility] , said you told her to call and you would be glad to go by there anytime

## 2012-03-02 ENCOUNTER — Encounter: Payer: Self-pay | Admitting: Internal Medicine

## 2012-03-13 ENCOUNTER — Encounter: Payer: Self-pay | Admitting: Internal Medicine

## 2012-03-13 DIAGNOSIS — R0989 Other specified symptoms and signs involving the circulatory and respiratory systems: Secondary | ICD-10-CM

## 2012-03-13 LAB — PACEMAKER DEVICE OBSERVATION

## 2012-04-02 ENCOUNTER — Encounter: Payer: Self-pay | Admitting: Internal Medicine

## 2012-04-17 ENCOUNTER — Ambulatory Visit (INDEPENDENT_AMBULATORY_CARE_PROVIDER_SITE_OTHER): Payer: Medicare Other | Admitting: *Deleted

## 2012-04-17 DIAGNOSIS — I442 Atrioventricular block, complete: Secondary | ICD-10-CM

## 2012-04-17 DIAGNOSIS — I4892 Unspecified atrial flutter: Secondary | ICD-10-CM

## 2012-04-24 DIAGNOSIS — I442 Atrioventricular block, complete: Secondary | ICD-10-CM

## 2012-04-24 LAB — PACEMAKER DEVICE OBSERVATION
BATTERY VOLTAGE: 2.69 V
RV LEAD IMPEDENCE PM: 699 Ohm
VENTRICULAR PACING PM: 99.3

## 2012-04-24 NOTE — Progress Notes (Signed)
Pacer check in clinic/checked at nursing home/kwm

## 2012-05-13 ENCOUNTER — Encounter: Payer: Medicare Other | Admitting: Internal Medicine

## 2012-05-15 ENCOUNTER — Encounter: Payer: Self-pay | Admitting: Internal Medicine

## 2012-05-15 ENCOUNTER — Ambulatory Visit (INDEPENDENT_AMBULATORY_CARE_PROVIDER_SITE_OTHER): Payer: Medicare Other | Admitting: Internal Medicine

## 2012-05-15 VITALS — BP 141/67 | HR 69 | Ht 70.5 in | Wt 216.0 lb

## 2012-05-15 DIAGNOSIS — I442 Atrioventricular block, complete: Secondary | ICD-10-CM

## 2012-05-15 DIAGNOSIS — I4892 Unspecified atrial flutter: Secondary | ICD-10-CM

## 2012-05-15 DIAGNOSIS — Z950 Presence of cardiac pacemaker: Secondary | ICD-10-CM

## 2012-05-15 DIAGNOSIS — I5022 Chronic systolic (congestive) heart failure: Secondary | ICD-10-CM

## 2012-05-15 DIAGNOSIS — I2589 Other forms of chronic ischemic heart disease: Secondary | ICD-10-CM

## 2012-05-15 DIAGNOSIS — I2510 Atherosclerotic heart disease of native coronary artery without angina pectoris: Secondary | ICD-10-CM

## 2012-05-15 LAB — PACEMAKER DEVICE OBSERVATION
AL AMPLITUDE: 1.4 mv
AL THRESHOLD: 1 V
BATTERY VOLTAGE: 2.67 V

## 2012-05-15 NOTE — Assessment & Plan Note (Signed)
The patient's device was interrogated.  The information was reviewed. No changes were made in the programming.    

## 2012-05-15 NOTE — Assessment & Plan Note (Signed)
Continue current meds 

## 2012-05-15 NOTE — Progress Notes (Signed)
In [facility] [place] Patient Care Team: Kimber Relic, MD as PCP - General (Internal Medicine)   HPI  [name] is a 79 y.o. male seen in followup for pacemaker implanted years ago for bradycardia further complicated by complete heart block. His ischemic heart disease with modest depression of LV systolic function at 40% and prior intervention of his LAD;  This was done in 2004 and was a PTCA of a chronic LAD occlusion resulting in the 50% hazy lesion with TIMI-3 flow.  He is on Plavix and has been since then.   He has a history of recurrent and significant orthostatic lightheadedness resulting in multiple falls. He is currently a nursing home and there are apparently considerable challenges with his care. He may try him on diuretics; he is incontinent. They have taken away his urinal because of concerns about the smell of urine. He continues to accumulate lower extremity edema. His daughter has also noted some shortness of breath. There has been no significant chest pain.     Past Medical History  Diagnosis Date  . Altered mental status     felt to be secondary to acute delirium on top of a suspected underlying dementia  . Fall     Fall with pelvic fracture  . Acute renal failure     from dehydration brought on by fall  . Pelvic fracture   . Depression   . History of congestive heart failure     with and ejection fraction of 40% to 45% and mild aortic stenosis  . Ischemic cardiomyopathy     History of ischemic cardiomyopathy  . Coronary artery disease   . History of hypertension   . History of obesity   . Obesity   . Hypothyroidism   . B12 deficiency   . Mild aortic stenosis   . Dementia     Questionable dementia  . Complete heart block 05/26/2008    Annotation: status post dual-chamber pacemaker Qualifier: Diagnosis of  By: [name], MD, Trixie Dredge Pacemaker-Dual- medtronic 05/21/2011    Past Surgical History  Procedure Laterality Date  . Cholecystectomy    .  Hemiarthroplasty hip  Aug 07, 2004    Left  . Circumcision  June 21, 2005    Current Outpatient Prescriptions  Medication Sig Dispense Refill  . carvedilol (COREG) 3.125 MG tablet Take 3.125 mg by mouth 2 (two) times daily with a meal.        . Cholecalciferol 5000 UNITS TABS Take 50,000 Units by mouth every 30 (thirty) days.        . clopidogrel (PLAVIX) 75 MG tablet Take 75 mg by mouth daily.        . Cyanocobalamin (VITAMIN B-12 IJ) Inject 1,000 mcg as directed every 30 (thirty) days.        . folic acid (FOLVITE) 0.5 MG tablet Take 1 mg by mouth daily.        . furosemide (LASIX) 40 MG tablet Take 40 mg by mouth daily.      Marland Kitchen glimepiride (AMARYL) 4 MG tablet Take 4 mg by mouth daily before breakfast.        . levothyroxine (SYNTHROID, LEVOTHROID) 100 MCG tablet Take 100 mcg by mouth daily.        . sertraline (ZOLOFT) 50 MG tablet Take 50 mg by mouth daily.        Marland Kitchen ALPRAZolam (XANAX) 0.25 MG tablet Take 0.25 mg by mouth as needed.        Marland Kitchen  simvastatin (ZOCOR) 20 MG tablet Take 20 mg by mouth at bedtime.         No current facility-administered medications for this visit.    Allergies  Allergen Reactions  . Codeine     REACTION: unknown  . Lorazepam     REACTION: unkown  . Promethazine Hcl     REACTION: unknown    Review of Systems negative except from HPI and PMH  Physical Exam BP 141/67  Pulse 69  Ht 5' 10.5" (1.791 m)  Wt 216 lb (97.977 kg)  BMI 30.54 kg/m2 Well developed and well nourished in no acute distress HENT normal E scleral and icterus clear Neck Supple JVP>10; carotids brisk and full Clear to ausculation  Regular rate and rhythm, no murmurs gallops or rub Soft with active bowel sounds No clubbing cyanosis 3+ and pitting Edema Alert and oriented, grossly normal motor and sensory function Skin Warm and Dry   ECG demonstrates AV pacing at 80  assessment and  Plan

## 2012-05-15 NOTE — Assessment & Plan Note (Signed)
I have asked of Dr. Katrinka Blazing input as to whether he needs to remain on Plavix 10 years following POBA of a LAD CTO

## 2012-05-15 NOTE — Patient Instructions (Signed)
Your physician wants you to follow-up in: 1 year with Dr. Klein. You will receive a reminder letter in the mail two months in advance. If you don't receive a letter, please call our office to schedule the follow-up appointment.  Your physician recommends that you continue on your current medications as directed. Please refer to the Current Medication list given to you today.  

## 2012-05-15 NOTE — Assessment & Plan Note (Signed)
Is a there is evidence of volume overload. We'll increase his diuretic from 40 daily to twice a day. I have asked that they do a metabolic profile at his nursing home and 3-4 weeks and a followup with his primary physician same timeframe.

## 2012-05-21 ENCOUNTER — Encounter: Payer: Self-pay | Admitting: Internal Medicine

## 2012-06-01 ENCOUNTER — Encounter: Payer: Self-pay | Admitting: Internal Medicine

## 2012-06-18 ENCOUNTER — Non-Acute Institutional Stay (SKILLED_NURSING_FACILITY): Payer: Medicare Other | Admitting: Adult Health

## 2012-06-18 DIAGNOSIS — I509 Heart failure, unspecified: Secondary | ICD-10-CM

## 2012-06-18 DIAGNOSIS — R609 Edema, unspecified: Secondary | ICD-10-CM

## 2012-06-19 ENCOUNTER — Non-Acute Institutional Stay (SKILLED_NURSING_FACILITY): Payer: Medicare Other | Admitting: Internal Medicine

## 2012-06-19 DIAGNOSIS — E1059 Type 1 diabetes mellitus with other circulatory complications: Secondary | ICD-10-CM

## 2012-06-19 DIAGNOSIS — I10 Essential (primary) hypertension: Secondary | ICD-10-CM

## 2012-06-19 DIAGNOSIS — E039 Hypothyroidism, unspecified: Secondary | ICD-10-CM

## 2012-06-19 DIAGNOSIS — I509 Heart failure, unspecified: Secondary | ICD-10-CM

## 2012-06-20 NOTE — Progress Notes (Signed)
PROGRESS NOTE  DATE: 06/19/12  FACILITY: [facility]  LEVEL OF CARE: SNF  Routine Visit  CHIEF COMPLAINT:  Manage CHF, hypothyroidism and hypertension  HISTORY OF PRESENT ILLNESS:  REASSESSMENT OF ONGOING PROBLEMS:  1. CHF:The patient does not relate significant weight changes, denies sob, DOE, orthopnea, PNDs, palpitations or chest pain.  CHF remains stable.  No complications form the medications being used.  Patient is concerned that his bilateral lower extremity swelling is persistent. She would lie further diuresis.  2.HYPOTHYROIDISM: The hypothyroidism remains stable. No complications noted from the medications presently being used.  The patient denies fatigue or constipation.  Last TSH 0.984 in 11/13.  3.HTN: Pt 's HTN remains stable.  Denies CP, sob, DOE, pedal edema, headaches, dizziness or visual disturbances.  No complications from the meds currently being used.  Last BP : 120/75, 130/70, 114/66.  PAST MEDICAL HISTORY : Reviewed.  No changes.  CURRENT MEDICATIONS: Reviewed per [facility]  REVIEW OF SYSTEMS:  GENERAL: no change in appetite, no fatigue, no weight changes, no fever, chills or weakness RESPIRATORY: no cough, SOB, DOE,, wheezing, hemoptysis CARDIAC: no chest pain, or palpitations.  Bilateral lower extremity swelling present. GI: no abdominal pain, diarrhea, constipation, heart burn, nausea or vomiting  PHYSICAL EXAMINATION  VS:  T 97              P 90            RR 21          BP 120/75             POX % 94                WT (Lb)  GENERAL: no acute distress, normal body habitus EYES: conjunctivae normal, sclerae normal, normal eye lids NECK: supple, trachea midline, no neck masses, no thyroid tenderness, no thyromegaly LYMPHATICS: no LAN in the neck, no supraclavicular LAN RESPIRATORY: breathing is even & unlabored, BS CTAB CARDIAC: RRR, no murmur,no extra heart sounds, +2 bilateral lower extremity edema GI: abdomen soft, normal BS, no masses, no  tenderness, no hepatomegaly, no splenomegaly PSYCHIATRIC: the patient is alert & oriented to person, affect & behavior appropriate  LABS/RADIOLOGY:  3/14 BNP 406.3, glucose 256, BUN 44, creatinine 1.63 otherwise BMP normal  2/14 hemoglobin A1c 7.2 11/13 WBC 10.8, hemoglobin 12.3, MCV 89.1, platelets 116, glucose 174, BUN 41, creatinine 1.66 otherwise CMP normal, fasting lipid panel normal  ASSESSMENT/PLAN:  1. CHF-unstable problem. Increase Lasix to 60 mg twice a day for 4 days and then decrease to 40 mg twice a day. 2. hypothyroidism-well-controlled. 3. hypertension-stable 4. diabetes mellitus-uncontrolled. Amaryl was increased. 5. anxiety-well controlled. 6. vitamin B12 deficiency-continue supplementation.  CPT CODE: 62130

## 2012-06-22 ENCOUNTER — Encounter: Payer: Self-pay | Admitting: Adult Health

## 2012-06-22 NOTE — Progress Notes (Signed)
  Subjective:    Patient ID: [name], male    DOB: 06/10/1919, 79 y.o.   MRN: [id number]  HPI This is a 79 year old male who was noted to have increased BLE edema, 2+ . No complaints of pain, fever nor SOB. 2 sat 99% on RA. Patient has Dx of CHF.   Review of Systems  Constitutional: Negative for fever and activity change.  HENT: Negative.   Eyes: Negative.   Respiratory: Negative for cough, chest tightness, shortness of breath and wheezing.   Cardiovascular: Positive for leg swelling.  Gastrointestinal: Negative for abdominal pain and abdominal distention.  Endocrine: Negative.   Genitourinary: Negative.   Neurological: Negative for dizziness and headaches.  Hematological: Does not bruise/bleed easily.  Psychiatric/Behavioral: Negative.        Objective:   Physical Exam  Constitutional: He appears well-developed and well-nourished.  HENT:  Head: Normocephalic.  Right Ear: External ear normal.  Left Ear: External ear normal.  Eyes: Conjunctivae are normal. Pupils are equal, round, and reactive to light.  Neck: Normal range of motion. Neck supple.  Cardiovascular: Normal rate, regular rhythm and normal heart sounds.   Pulmonary/Chest: Breath sounds normal. He is in respiratory distress. He has no wheezes. He exhibits no tenderness.  Abdominal: Soft. Bowel sounds are normal. He exhibits no distension.  Musculoskeletal: He exhibits edema.  BLE edema, 2+  Neurological: He is alert.  Skin: Skin is warm and dry.  Psychiatric: He has a normal mood and affect. His behavior is normal.  LABS: 06/18/12  NA 135  K 4.1  Glucose 256  BUN 44 creatinine 1.63  BNP 406.3       Assessment & Plan:  CHF -  BMP in 1 week

## 2012-06-30 ENCOUNTER — Ambulatory Visit (INDEPENDENT_AMBULATORY_CARE_PROVIDER_SITE_OTHER): Payer: Medicare Other | Admitting: *Deleted

## 2012-06-30 DIAGNOSIS — I4892 Unspecified atrial flutter: Secondary | ICD-10-CM

## 2012-06-30 DIAGNOSIS — I442 Atrioventricular block, complete: Secondary | ICD-10-CM

## 2012-07-01 ENCOUNTER — Non-Acute Institutional Stay (SKILLED_NURSING_FACILITY): Payer: Medicare Other | Admitting: Adult Health

## 2012-07-01 DIAGNOSIS — I10 Essential (primary) hypertension: Secondary | ICD-10-CM

## 2012-07-01 DIAGNOSIS — I5022 Chronic systolic (congestive) heart failure: Secondary | ICD-10-CM

## 2012-07-01 DIAGNOSIS — E039 Hypothyroidism, unspecified: Secondary | ICD-10-CM

## 2012-07-01 DIAGNOSIS — F329 Major depressive disorder, single episode, unspecified: Secondary | ICD-10-CM

## 2012-07-01 DIAGNOSIS — E1159 Type 2 diabetes mellitus with other circulatory complications: Secondary | ICD-10-CM

## 2012-07-01 DIAGNOSIS — I2510 Atherosclerotic heart disease of native coronary artery without angina pectoris: Secondary | ICD-10-CM

## 2012-07-02 ENCOUNTER — Other Ambulatory Visit: Payer: Self-pay

## 2012-07-02 LAB — PACEMAKER DEVICE OBSERVATION
AL IMPEDENCE PM: 558 Ohm
RV LEAD IMPEDENCE PM: 715 Ohm
VENTRICULAR PACING PM: 99.9

## 2012-07-02 NOTE — Progress Notes (Signed)
Pacemaker check for battery longevity. Battery longevity 7 months with range of 1 to 16 months. Pt to be checked in 1 month at [facility] by [place].

## 2012-07-24 ENCOUNTER — Encounter: Payer: Self-pay | Admitting: Internal Medicine

## 2012-08-24 ENCOUNTER — Ambulatory Visit (INDEPENDENT_AMBULATORY_CARE_PROVIDER_SITE_OTHER): Payer: Medicare Other | Admitting: *Deleted

## 2012-08-24 DIAGNOSIS — I442 Atrioventricular block, complete: Secondary | ICD-10-CM

## 2012-08-25 LAB — PACEMAKER DEVICE OBSERVATION: AL IMPEDENCE PM: 556 Ohm

## 2012-08-25 NOTE — Progress Notes (Signed)
Pacer check for battery.

## 2012-09-03 ENCOUNTER — Non-Acute Institutional Stay (SKILLED_NURSING_FACILITY): Payer: Medicare Other | Admitting: Internal Medicine

## 2012-09-03 DIAGNOSIS — E039 Hypothyroidism, unspecified: Secondary | ICD-10-CM

## 2012-09-03 DIAGNOSIS — I5022 Chronic systolic (congestive) heart failure: Secondary | ICD-10-CM

## 2012-09-03 DIAGNOSIS — I10 Essential (primary) hypertension: Secondary | ICD-10-CM

## 2012-09-03 DIAGNOSIS — E1159 Type 2 diabetes mellitus with other circulatory complications: Secondary | ICD-10-CM | POA: Insufficient documentation

## 2012-09-03 NOTE — Progress Notes (Signed)
PROGRESS NOTE  DATE: 09/03/12  FACILITY: [facility]  LEVEL OF CARE: SNF  Routine Visit  CHIEF COMPLAINT:  Manage CHF, hypothyroidism and hypertension  HISTORY OF PRESENT ILLNESS:  REASSESSMENT OF ONGOING PROBLEMS:  1. CHF:The patient does not relate significant weight changes, denies sob, DOE, orthopnea, PNDs, palpitations or chest pain.  CHF remains stable.  No complications form the medications being used.  Patient is concerned that his bilateral lower extremity swelling is persistent. She would lie further diuresis.  2.HYPOTHYROIDISM: The hypothyroidism remains stable. No complications noted from the medications presently being used.  The patient denies fatigue or constipation.  Last TSH 0.984 in 11/13.  3.HTN: Pt 's HTN remains stable.  Denies CP, sob, DOE, pedal edema, headaches, dizziness or visual disturbances.  No complications from the meds currently being used.  Last BP : 118/69, 120/75, 130/70, 114/66.  PAST MEDICAL HISTORY : Reviewed.  No changes.  CURRENT MEDICATIONS: Reviewed per [facility]  REVIEW OF SYSTEMS:  GENERAL: no change in appetite, no fatigue, no weight changes, no fever, chills or weakness RESPIRATORY: no cough, SOB, DOE,, wheezing, hemoptysis CARDIAC: no chest pain, or palpitations.  Bilateral lower extremity swelling present. GI: no abdominal pain, diarrhea, constipation, heart burn, nausea or vomiting  PHYSICAL EXAMINATION  VS:  T 96              P 80            RR 20          BP 118/69            POX % 93                WT (Lb)  GENERAL: no acute distress, normal body habitus NECK: supple, trachea midline, no neck masses, no thyroid tenderness, no thyromegaly RESPIRATORY: breathing is even & unlabored, BS CTAB CARDIAC: RRR, no murmur,no extra heart sounds, +2 bilateral lower extremity edema, right greater than left GI: abdomen soft, normal BS, no masses, no tenderness, no hepatomegaly, no splenomegaly PSYCHIATRIC: the patient is alert &  oriented to person, affect & behavior appropriate  LABS/RADIOLOGY:  4/14 glucose 237, BUN 31, creatinine 1.52 otherwise BMP normal  3/14 BNP 406.3, glucose 256, BUN 44, creatinine 1.63 otherwise BMP normal  2/14 hemoglobin A1c 7.2 11/13 WBC 10.8, hemoglobin 12.3, MCV 89.1, platelets 116, glucose 174, BUN 41, creatinine 1.66 otherwise CMP normal, fasting lipid panel normal  ASSESSMENT/PLAN:  1. CHF-Lasix was decreased. 2. hypothyroidism-well-controlled. Check TSH. 3. hypertension-stable 4. diabetes mellitus- Check hemoglobin A1c. 5. anxiety-well controlled. 6. vitamin B12 deficiency-continue supplementation. 7. check CBC and liver profile.  CPT CODE: 40981

## 2012-09-16 ENCOUNTER — Encounter: Payer: Self-pay | Admitting: Internal Medicine

## 2012-10-09 ENCOUNTER — Non-Acute Institutional Stay (SKILLED_NURSING_FACILITY): Payer: Medicare Other | Admitting: Internal Medicine

## 2012-10-09 DIAGNOSIS — I5022 Chronic systolic (congestive) heart failure: Secondary | ICD-10-CM

## 2012-10-09 DIAGNOSIS — E1159 Type 2 diabetes mellitus with other circulatory complications: Secondary | ICD-10-CM

## 2012-10-09 DIAGNOSIS — E039 Hypothyroidism, unspecified: Secondary | ICD-10-CM

## 2012-10-09 DIAGNOSIS — I10 Essential (primary) hypertension: Secondary | ICD-10-CM

## 2012-10-14 ENCOUNTER — Ambulatory Visit (INDEPENDENT_AMBULATORY_CARE_PROVIDER_SITE_OTHER): Payer: Medicare Other | Admitting: *Deleted

## 2012-10-14 DIAGNOSIS — I4892 Unspecified atrial flutter: Secondary | ICD-10-CM

## 2012-10-16 NOTE — Progress Notes (Signed)
PROGRESS NOTE  DATE: 10/09/12  FACILITY: [facility]  LEVEL OF CARE: SNF  Routine Visit  CHIEF COMPLAINT:  Manage CHF, hypothyroidism and hypertension  HISTORY OF PRESENT ILLNESS:  REASSESSMENT OF ONGOING PROBLEMS:  CHF:The patient does not relate significant weight changes, denies sob, DOE, orthopnea, PNDs, palpitations or chest pain.  CHF remains stable.  No complications form the medications being used.  Patient is concerned that his bilateral lower extremity swelling is persistent. She would lie further diuresis.  HYPOTHYROIDISM: The hypothyroidism remains stable. No complications noted from the medications presently being used.  The patient denies fatigue or constipation.  Last TSH 0.984 in 11/13.  HTN: Pt 's HTN remains stable.  Denies CP, sob, DOE, pedal edema, headaches, dizziness or visual disturbances.  No complications from the meds currently being used.  Last BP : 118/69, 120/75, 130/70, 114/66, 118/69.  PAST MEDICAL HISTORY : Reviewed.  No changes.  CURRENT MEDICATIONS: Reviewed per [facility]  REVIEW OF SYSTEMS:  GENERAL: no change in appetite, no fatigue, no weight changes, no fever, chills or weakness RESPIRATORY: no cough, SOB, DOE,wheezing, hemoptysis CARDIAC: no chest pain, or palpitations.  Bilateral lower extremity swelling present. GI: no abdominal pain, diarrhea, constipation, heart burn, nausea or vomiting  PHYSICAL EXAMINATION  VS:  T 97              P 73           RR 20          BP 118/69            POX % 98                WT (Lb)  GENERAL: no acute distress, normal body habitus NECK: supple, trachea midline, no neck masses, no thyroid tenderness, no thyromegaly RESPIRATORY: breathing is even & unlabored, BS CTAB CARDIAC: RRR, no murmur,no extra heart sounds, +2 bilateral lower extremity edema, right greater than left GI: abdomen soft, normal BS, no masses, no tenderness, no hepatomegaly, no splenomegaly PSYCHIATRIC: the patient is alert & oriented  to person, affect & behavior appropriate  LABS/RADIOLOGY:  4/14 glucose 237, BUN 31, creatinine 1.52 otherwise BMP normal  3/14 BNP 406.3, glucose 256, BUN 44, creatinine 1.63 otherwise BMP normal  2/14 hemoglobin A1c 7.2 11/13 WBC 10.8, hemoglobin 12.3, MCV 89.1, platelets 116, glucose 174, BUN 41, creatinine 1.66 otherwise CMP normal, fasting lipid panel normal  ASSESSMENT/PLAN:  CHF-well compensated hypothyroidism-well-controlled. Check TSH. hypertension-stable diabetes mellitus- Check hemoglobin A1c. anxiety-well controlled. vitamin B12 deficiency-continue supplementation. check CBC and liver profile.  CPT CODE: 16109

## 2012-10-29 LAB — PACEMAKER DEVICE OBSERVATION: VENTRICULAR PACING PM: 99.9

## 2012-10-29 NOTE — Progress Notes (Signed)
Pacer check at [facility] for battery. Battery longevity 3 months with range < 1 to 10 months. Pt to be checked around 11-14-12 for battery check at [facility]. Next billable check in October/kwm

## 2012-11-10 ENCOUNTER — Non-Acute Institutional Stay (SKILLED_NURSING_FACILITY): Payer: Medicare Other | Admitting: Adult Health

## 2012-11-10 DIAGNOSIS — E538 Deficiency of other specified B group vitamins: Secondary | ICD-10-CM

## 2012-11-10 DIAGNOSIS — I10 Essential (primary) hypertension: Secondary | ICD-10-CM

## 2012-11-10 DIAGNOSIS — F329 Major depressive disorder, single episode, unspecified: Secondary | ICD-10-CM

## 2012-11-10 DIAGNOSIS — E039 Hypothyroidism, unspecified: Secondary | ICD-10-CM

## 2012-11-10 DIAGNOSIS — I2510 Atherosclerotic heart disease of native coronary artery without angina pectoris: Secondary | ICD-10-CM

## 2012-11-10 DIAGNOSIS — F3289 Other specified depressive episodes: Secondary | ICD-10-CM

## 2012-11-10 DIAGNOSIS — I509 Heart failure, unspecified: Secondary | ICD-10-CM

## 2012-11-10 DIAGNOSIS — I5022 Chronic systolic (congestive) heart failure: Secondary | ICD-10-CM

## 2012-11-10 DIAGNOSIS — E1159 Type 2 diabetes mellitus with other circulatory complications: Secondary | ICD-10-CM

## 2012-11-12 ENCOUNTER — Non-Acute Institutional Stay (SKILLED_NURSING_FACILITY): Payer: Medicare Other | Admitting: Adult Health

## 2012-11-12 DIAGNOSIS — N390 Urinary tract infection, site not specified: Secondary | ICD-10-CM

## 2012-12-01 ENCOUNTER — Encounter: Payer: Self-pay | Admitting: Internal Medicine

## 2012-12-18 ENCOUNTER — Non-Acute Institutional Stay (SKILLED_NURSING_FACILITY): Payer: Medicare Other | Admitting: Adult Health

## 2012-12-18 DIAGNOSIS — F329 Major depressive disorder, single episode, unspecified: Secondary | ICD-10-CM

## 2012-12-18 DIAGNOSIS — E039 Hypothyroidism, unspecified: Secondary | ICD-10-CM | POA: Insufficient documentation

## 2012-12-18 DIAGNOSIS — I5022 Chronic systolic (congestive) heart failure: Secondary | ICD-10-CM

## 2012-12-18 DIAGNOSIS — E538 Deficiency of other specified B group vitamins: Secondary | ICD-10-CM

## 2012-12-18 DIAGNOSIS — E1059 Type 1 diabetes mellitus with other circulatory complications: Secondary | ICD-10-CM

## 2012-12-18 DIAGNOSIS — I2510 Atherosclerotic heart disease of native coronary artery without angina pectoris: Secondary | ICD-10-CM

## 2012-12-18 DIAGNOSIS — N390 Urinary tract infection, site not specified: Secondary | ICD-10-CM | POA: Insufficient documentation

## 2012-12-18 DIAGNOSIS — I10 Essential (primary) hypertension: Secondary | ICD-10-CM

## 2012-12-18 DIAGNOSIS — E1159 Type 2 diabetes mellitus with other circulatory complications: Secondary | ICD-10-CM

## 2012-12-18 NOTE — Progress Notes (Signed)
Patient ID: [name], male   DOB: Jun 06, 1919, 79 y.o.   MRN: [id number]       PROGRESS NOTE  DATE: 11/12/2012  FACILITY:  [facility]  LEVEL OF CARE: SNF (31)  Acute Visit  CHIEF COMPLAINT:  Manage UTI  HISTORY OF PRESENT ILLNESS: This is a 79 year old male whose urine was noted to have a strong odor. Urine culture shows >= 100,000 colonies/mL Escherichia coli. No complaints of dysuria nor hematuria.  PAST MEDICAL HISTORY : Reviewed.  No changes.  CURRENT MEDICATIONS: Reviewed per [facility]  REVIEW OF SYSTEMS:  GENERAL: no change in appetite, no fatigue, no weight changes, no fever, chills or weakness RESPIRATORY: no cough, SOB, DOE,, wheezing, hemoptysis CARDIAC: no chest pain, or palpitations, + edema GI: no abdominal pain, diarrhea, constipation, heart burn, nausea or vomiting  PHYSICAL EXAMINATION  VS:  T97.5        P 71       RR 20       BP 110/66      POX 96 %       WT 216.4 (Lb)  GENERAL: no acute distress, normal body habitus EYES: conjunctivae normal, sclerae normal, normal eye lids NECK: supple, trachea midline, no neck masses, no thyroid tenderness, no thyromegaly LYMPHATICS: no LAN in the neck, no supraclavicular LAN RESPIRATORY: breathing is even & unlabored, BS CTAB CARDIAC: RRR, no murmur,no extra heart sounds, BLE edema 2+ GI: abdomen soft, normal BS, no masses, no tenderness, no hepatomegaly, no splenomegaly PSYCHIATRIC: the patient is alert & oriented to person, affect & behavior appropriate  LABS/RADIOLOGY:  8/14 hemoglobin A1c 8.0 6/14 TSH 2.469 hemoglobin A1c 7.5 WBC 5.3 hemoglobin 12.1 hematocrit 35.6 the liver profile normal Lipid profile normal 4/14 glucose 237, BUN 31, creatinine 1.52 otherwise BMP normal 3/14 BNP 406.3, glucose 256, BUN 44, creatinine 1.63 otherwise BMP normal  2/14 hemoglobin A1c 7.2 11/13 WBC 10.8, hemoglobin 12.3, MCV 89.1, platelets 116, glucose 174, BUN 41, creatinine 1.66 otherwise CMP normal, fasting lipid  panel normal   ASSESSMENT/PLAN:  UTI -  start Bactrim DS 1 tab by mouth twice a day x10 days  CPT CODE: 40981

## 2012-12-18 NOTE — Progress Notes (Signed)
Patient ID: [name], male   DOB: 12-Jul-1919, 79 y.o.   MRN: [id number]       PROGRESS NOTE  DATE: 07/01/2012  FACILITY: Nursing Home Location: [facility]  LEVEL OF CARE: SNF (31)  Routine Visit  CHIEF COMPLAINT:  Manage hypertension, systolic congestive heart failure, diabetes mellitus, type II and hypothyroidism  HISTORY OF PRESENT ILLNESS:  REASSESSMENT OF ONGOING PROBLEM(S): CHF:The patient does not relate significant weight changes, denies sob, DOE, orthopnea, PNDs, pedal edema, palpitations or chest pain.  CHF remains stable.  No complications form the medications being used.  DM:pt's DM remains stable.  Pt denies polyuria, polydipsia, polyphagia, changes in vision or hypoglycemic episodes.  No complications noted from the medication presently being used.  2/14 hemoglobin A1c7.2  DEPRESSION: The depression remains stable. Patient denies ongoing feelings of sadness, insomnia, anedhonia or lack of appetite. No complications reported from the medications currently being used. Staff do not report behavioral problems.   PAST MEDICAL HISTORY : Reviewed.  No changes.  CURRENT MEDICATIONS: Reviewed per [facility]  REVIEW OF SYSTEMS:  GENERAL: no change in appetite, no fatigue, no weight changes, no fever, chills or weakness RESPIRATORY: no cough, SOB, DOE, wheezing, hemoptysis CARDIAC: no chest pain, edema or palpitations GI: no abdominal pain, diarrhea, constipation, heart burn, nausea or vomiting  PHYSICAL EXAMINATION  VS:  T98.6        P 80       RR 20       BP 127/64      POX 99  %     WT 217.6 (Lb)  GENERAL: no acute distress, normal body habitus EYES: conjunctivae normal, sclerae normal, normal eye lids NECK: supple, trachea midline, no neck masses, no thyroid tenderness, no thyromegaly LYMPHATICS: no LAN in the neck, no supraclavicular LAN RESPIRATORY: breathing is even & unlabored, BS CTAB CARDIAC: RRR, no murmur,no extra heart sounds, no edema GI:  abdomen soft, normal BS, no masses, no tenderness, no hepatomegaly, no splenomegaly PSYCHIATRIC: the patient is alert & oriented to person, affect & behavior appropriate  LABS/RADIOLOGY:  and in 07/01/12 sodium 133 potassium 4.5 glucose 236 BUN 44 creatinine 1.95 calcium 8.5 3/14 BNP 406.3, glucose 256, BUN 44, creatinine 1.63 otherwise BMP normal  2/14 hemoglobin A1c 7.2 11/13 WBC 10.8, hemoglobin 12.3, MCV 89.1, platelets 116, glucose 174, BUN 41, creatinine 1.66 otherwise CMP normal, fasting lipid panel normal   ASSESSMENT/PLAN:  Systolic CHF - stable; creatinine is 1.95 - elevated; decrease Lasix to 20 mg 1 tab by mouth twice a day; BMP in one week  Hypertension - well-controlled  Hypothyroidism - well-controlled  Depression - continue Zoloft and  Diabetes mellitus, type II - stable  CAD - stable   CPT CODE: 78295

## 2012-12-18 NOTE — Progress Notes (Signed)
Patient ID: [name], male   DOB: May 15, 1919, 79 y.o.   MRN: [id number]         PROGRESS NOTE  DATE:  12/18/12  FACILITY: [facility]  LEVEL OF CARE: SNF (31)  Routine Visit  CHIEF COMPLAINT:  Manage CHF, hypothyroidism and hypertension  HISTORY OF PRESENT ILLNESS:  REASSESSMENT OF ONGOING PROBLEMS:  CHF:The patient does not relate significant weight changes, denies sob, DOE, orthopnea, PNDs, pedal edema, palpitations or chest pain.  CHF remains stable.  No complications form the medications being used.  DEPRESSION: The depression remains stable. Patient denies ongoing feelings of sadness, insomnia, anedhonia or lack of appetite. No complications reported from the medications currently being used. Staff do not report behavioral problems.  DM:pt's DM remains stable.  Pt denies polyuria, polydipsia, polyphagia, changes in vision or hypoglycemic episodes.  No complications noted from the medication presently being used.    8/14 hemoglobin A1c 8.0  PAST MEDICAL HISTORY : Reviewed.  No changes.  CURRENT MEDICATIONS: Reviewed per [facility]  REVIEW OF SYSTEMS:  GENERAL: no change in appetite, no fatigue, no weight changes, no fever, chills or weakness RESPIRATORY: no cough, SOB, DOE,wheezing, hemoptysis CARDIAC: no chest pain, or palpitations.  Bilateral lower extremity swelling present. GI: no abdominal pain, diarrhea, constipation, heart burn, nausea or vomiting  PHYSICAL EXAMINATION  VS:  T 97.3            P 68           RR 20          BP 129/69           POX  94 %                WT 212 (Lb)  GENERAL: no acute distress, normal body habitus LYMPHATICS: No lymphadenopathy in the neck, no soap for an OB [employer] lymphadenopathy noted  RESPIRATORY: breathing is even & unlabored, BS CTAB CARDIAC: RRR, no murmur,no extra heart sounds, +2 bilateral lower extremity edema, right greater than left GI: abdomen soft, normal BS, no masses, no tenderness, no hepatomegaly, no  splenomegaly PSYCHIATRIC: the patient is alert & oriented to person, affect & behavior appropriate  LABS/RADIOLOGY: 8/14 hemoglobin A1c 8.0 6/14 TSH 2.469 hemoglobin A1c 7.5 WBC 5.3 hemoglobin 12.1 hematocrit 35.6 the liver profile normal Lipid profile normal 4/14 glucose 237, BUN 31, creatinine 1.52 otherwise BMP normal 3/14 BNP 406.3, glucose 256, BUN 44, creatinine 1.63 otherwise BMP normal  2/14 hemoglobin A1c 7.2 11/13 WBC 10.8, hemoglobin 12.3, MCV 89.1, platelets 116, glucose 174, BUN 41, creatinine 1.66 otherwise CMP normal, fasting lipid panel normal  ASSESSMENT/PLAN:  CHF-well compensated hypothyroidism-well-controlled. hypertension-well-controlled diabetes mellitus- stable. anxiety-well controlled. vitamin B12 deficiency-continue supplementation. CAD - continue Plavix Depression - continue Zoloft  CPT CODE: 78295

## 2012-12-18 NOTE — Progress Notes (Signed)
Patient ID: [name], male   DOB: 07-31-1919, 79 y.o.   MRN: [id number]        PROGRESS NOTE  DATE:  11/10/12 FACILITY: [facility]  LEVEL OF CARE: SNF (31)  Routine Visit  CHIEF COMPLAINT:  Manage CHF, hypothyroidism and hypertension  HISTORY OF PRESENT ILLNESS:  REASSESSMENT OF ONGOING PROBLEMS:  CAD: The angina has been stable. The patient denies dyspnea on exertion, orthopnea, pedal edema, palpitations and paroxysmal nocturnal dyspnea. No complications noted from the medication presently being used.  HYPOTHYROIDISM: The hypothyroidism remains stable. No complications noted from the medications presently being used.  The patient denies fatigue or constipation.  6/14 TSH 2.469   DM:pt's DM remains stable.  Pt denies polyuria, polydipsia, polyphagia, changes in vision or hypoglycemic episodes.  No complications noted from the medication presently being used.    8/14 hemoglobin A1c 8.0  PAST MEDICAL HISTORY : Reviewed.  No changes.  CURRENT MEDICATIONS: Reviewed per [facility]  REVIEW OF SYSTEMS:  GENERAL: no change in appetite, no fatigue, no weight changes, no fever, chills or weakness RESPIRATORY: no cough, SOB, DOE,wheezing, hemoptysis CARDIAC: no chest pain, or palpitations.  Bilateral lower extremity swelling present. GI: no abdominal pain, diarrhea, constipation, heart burn, nausea or vomiting  PHYSICAL EXAMINATION  VS:  T 97.5             P 78           RR 20          BP 123/75           POX  96%                WT216.4 (Lb)  GENERAL: no acute distress, normal body habitus NECK: supple, trachea midline, no neck masses, no thyroid tenderness, no thyromegaly LYMPHATICS: No lymphadenopathy in the neck, no soap for an OB [place] lymphadenopathy noted  RESPIRATORY: breathing is even & unlabored, BS CTAB CARDIAC: RRR, no murmur,no extra heart sounds, +2 bilateral lower extremity edema, right greater than left GI: abdomen soft, normal BS, no masses, no tenderness, no  hepatomegaly, no splenomegaly PSYCHIATRIC: the patient is alert & oriented to person, affect & behavior appropriate  LABS/RADIOLOGY: 8/14 hemoglobin A1c 8.0 6/14 TSH 2.469 hemoglobin A1c 7.5 WBC 5.3 hemoglobin 12.1 hematocrit 35.6 the liver profile normal Lipid profile normal 4/14 glucose 237, BUN 31, creatinine 1.52 otherwise BMP normal 3/14 BNP 406.3, glucose 256, BUN 44, creatinine 1.63 otherwise BMP normal  2/14 hemoglobin A1c 7.2 11/13 WBC 10.8, hemoglobin 12.3, MCV 89.1, platelets 116, glucose 174, BUN 41, creatinine 1.66 otherwise CMP normal, fasting lipid panel normal  ASSESSMENT/PLAN:  CHF-well compensated hypothyroidism-well-controlled. hypertension-well-controlled diabetes mellitus- stable. anxiety-well controlled. vitamin B12 deficiency-continue supplementation. CAD - continue Plavix Depression - continue Zoloft  CPT CODE: 78295

## 2013-01-06 ENCOUNTER — Non-Acute Institutional Stay (SKILLED_NURSING_FACILITY): Payer: PRIVATE HEALTH INSURANCE | Admitting: Internal Medicine

## 2013-01-06 DIAGNOSIS — E039 Hypothyroidism, unspecified: Secondary | ICD-10-CM

## 2013-01-06 DIAGNOSIS — E1159 Type 2 diabetes mellitus with other circulatory complications: Secondary | ICD-10-CM

## 2013-01-06 DIAGNOSIS — I5022 Chronic systolic (congestive) heart failure: Secondary | ICD-10-CM

## 2013-01-06 DIAGNOSIS — I10 Essential (primary) hypertension: Secondary | ICD-10-CM

## 2013-01-06 NOTE — Progress Notes (Signed)
PROGRESS NOTE  DATE: 01/06/13  FACILITY: [facility]  LEVEL OF CARE: SNF  Routine Visit  CHIEF COMPLAINT:  Manage CHF, hypothyroidism and hypertension  HISTORY OF PRESENT ILLNESS:  REASSESSMENT OF ONGOING PROBLEMS:  CHF:The patient does not relate significant weight changes, denies sob, DOE, orthopnea, PNDs, palpitations or chest pain.  CHF remains stable.  No complications form the medications being used.  Patient is concerned that his bilateral lower extremity swelling is persistent.   HYPOTHYROIDISM: The hypothyroidism remains stable. No complications noted from the medications presently being used.  The patient denies fatigue or constipation.  Last TSH 0.984 in 11/13, in 10/14 TSH 4.455.  HTN: Pt 's HTN remains stable.  Denies CP, sob, DOE, headaches, dizziness or visual disturbances.  No complications from the meds currently being used.  Last BP : 118/69, 120/75, 130/70, 114/66, 118/69, 137/69.  PAST MEDICAL HISTORY : Reviewed.  No changes.  CURRENT MEDICATIONS: Reviewed per [facility]  REVIEW OF SYSTEMS:  GENERAL: no change in appetite, no fatigue, no weight changes, no fever, chills or weakness RESPIRATORY: no cough, SOB, DOE,wheezing, hemoptysis CARDIAC: no chest pain, or palpitations.  Bilateral lower extremity swelling present. GI: no abdominal pain, diarrhea, constipation, heart burn, nausea or vomiting  PHYSICAL EXAMINATION  VS:  T 97.1              P 65           RR 20          BP 137/69            POX % 95                WT (Lb)  GENERAL: no acute distress, normal body habitus EYES: normal sclerae, normal conjunctivae, no discharge NECK: supple, trachea midline, no neck masses, no thyroid tenderness, no thyromegaly LYMPHATICS: no cervical LAN, no supraclavicular LAN RESPIRATORY: breathing is even & unlabored, BS CTAB CARDIAC: RRR, no murmur,no extra heart sounds, +2 bilateral lower extremity edema GI: abdomen soft, normal BS, no masses, no tenderness, no  hepatomegaly, no splenomegaly PSYCHIATRIC: the patient is alert & oriented to person, affect & behavior appropriate  LABS/RADIOLOGY:  10/14 glc 185, bun 29, cr 1.4 ow cmp nl, HbA1c 7.4, Hb 12.7, mcv 94.3, plt 129, wbc 5.4  4/14 glucose 237, BUN 31, creatinine 1.52 otherwise BMP normal  3/14 BNP 406.3, glucose 256, BUN 44, creatinine 1.63 otherwise BMP normal  2/14 hemoglobin A1c 7.2 11/13 WBC 10.8, hemoglobin 12.3, MCV 89.1, platelets 116, glucose 174, BUN 41, creatinine 1.66 otherwise CMP normal, fasting lipid panel normal  ASSESSMENT/PLAN:  CHF-well compensated hypothyroidism-well-controlled. hypertension-stable diabetes mellitus- uncontrolled.  Increase amaryl to 6mg  qd. anxiety-well controlled. vitamin B12 deficiency-continue supplementation. ARF-likely due to lasix.  Monitor. Anemia of chronic kidney dz-stable.  CPT CODE: 40981

## 2013-01-06 NOTE — Addendum Note (Signed)
Addended by: Angela Cox on: 01/06/2013 09:31 PM   Modules accepted: Level of Service

## 2013-01-22 ENCOUNTER — Encounter: Payer: Self-pay | Admitting: *Deleted

## 2013-01-22 ENCOUNTER — Ambulatory Visit (INDEPENDENT_AMBULATORY_CARE_PROVIDER_SITE_OTHER): Payer: PRIVATE HEALTH INSURANCE | Admitting: Cardiology

## 2013-01-22 ENCOUNTER — Encounter: Payer: Self-pay | Admitting: Internal Medicine

## 2013-01-22 ENCOUNTER — Encounter: Payer: Self-pay | Admitting: Cardiology

## 2013-01-22 ENCOUNTER — Telehealth: Payer: Self-pay | Admitting: *Deleted

## 2013-01-22 VITALS — BP 114/69 | HR 65 | Ht 70.5 in | Wt 209.8 lb

## 2013-01-22 DIAGNOSIS — Z45010 Encounter for checking and testing of cardiac pacemaker pulse generator [battery]: Secondary | ICD-10-CM

## 2013-01-22 DIAGNOSIS — I255 Ischemic cardiomyopathy: Secondary | ICD-10-CM

## 2013-01-22 DIAGNOSIS — Z950 Presence of cardiac pacemaker: Secondary | ICD-10-CM

## 2013-01-22 DIAGNOSIS — I442 Atrioventricular block, complete: Secondary | ICD-10-CM

## 2013-01-22 DIAGNOSIS — Z45018 Encounter for adjustment and management of other part of cardiac pacemaker: Secondary | ICD-10-CM

## 2013-01-22 DIAGNOSIS — I5022 Chronic systolic (congestive) heart failure: Secondary | ICD-10-CM

## 2013-01-22 DIAGNOSIS — I2589 Other forms of chronic ischemic heart disease: Secondary | ICD-10-CM

## 2013-01-22 LAB — CBC WITH DIFFERENTIAL/PLATELET
Basophils Absolute: 0 10*3/uL (ref 0.0–0.1)
Basophils Relative: 0.5 % (ref 0.0–3.0)
Eosinophils Absolute: 0.7 10*3/uL (ref 0.0–0.7)
HCT: 39.2 % (ref 39.0–52.0)
Hemoglobin: 13.3 g/dL (ref 13.0–17.0)
Lymphs Abs: 1 10*3/uL (ref 0.7–4.0)
MCHC: 34 g/dL (ref 30.0–36.0)
MCV: 89.7 fl (ref 78.0–100.0)
Neutro Abs: 4.5 10*3/uL (ref 1.4–7.7)
RBC: 4.37 Mil/uL (ref 4.22–5.81)
RDW: 13.6 % (ref 11.5–14.6)

## 2013-01-22 LAB — BASIC METABOLIC PANEL WITH GFR
BUN: 48 mg/dL — ABNORMAL HIGH (ref 6–23)
CO2: 31 meq/L (ref 19–32)
Calcium: 9.6 mg/dL (ref 8.4–10.5)
Chloride: 97 meq/L (ref 96–112)
Creatinine, Ser: 1.8 mg/dL — ABNORMAL HIGH (ref 0.4–1.5)
GFR: 39.03 mL/min — ABNORMAL LOW
Glucose, Bld: 173 mg/dL — ABNORMAL HIGH (ref 70–99)
Potassium: 4.1 meq/L (ref 3.5–5.1)
Sodium: 136 meq/L (ref 135–145)

## 2013-01-22 LAB — PACEMAKER DEVICE OBSERVATION

## 2013-01-22 NOTE — Patient Instructions (Addendum)
Your physician has recommended that you have a pacemaker inserted January 27, 2013 AT 2:00PM. ARRIVE AT 12:00 NOON. A pacemaker is a small device that is placed under the skin of your chest or abdomen to help control abnormal heart rhythms. This device uses electrical pulses to prompt the heart to beat at a normal rate. Pacemakers are used to treat heart rhythms that are too slow. Wire (leads) are attached to the pacemaker that goes into the chambers of you heart. This is done in the hospital and usually requires and overnight stay. Please see the instruction sheet given to you today for more information.  Your physician recommends that you return for lab work in: BMET, CBC  Your physician has recommended you make the following change in your medication:   HOLD YOUR LASIX AND YOUR GLIMEPIRIDE MORNING OF PROCEDURE  Your physician recommends that you continue on your current medications as directed. Please refer to the Current Medication list given to you today.

## 2013-01-22 NOTE — Telephone Encounter (Signed)
Called and lm for pt daughter on voicemail to give pt lab results. Number prvided

## 2013-01-22 NOTE — Progress Notes (Signed)
ELECTROPHYSIOLOGY OFFICE NOTE  Patient ID: [name] MRN: [id number], DOB/AGE: March 13, 1920   Date of Visit: 01/22/2013  Primary Physician: [name] Relic, MD Primary Cardiologist / Primary EP: [name] Magic, MD / [name] Mount, MD Reason for Visit: EP / device follow-up  History of Present Illness  [name] is a 79 y.o. male with CHB s/p PPM implant, CAD s/p PCI to LAD in 2004, ischemic CM, EF 40-45% by echo in 2010, chronic systolic HF, CKD, hypothyroidism, DM and orthostatic hypotension with falls who presents today for electrophysiology/device follow-up. He is accompanied by his daughter who assists with history questions. His PPM battery is nearing ERI and he has been getting this checked monthly at [facility].  Since last being seen in our clinic, he reports he is doing "about the same" and has no complaints. He denies chest pain or shortness of breath. He denies palpitations, dizziness, near syncope or syncope. His daughter is concerned about persistent LE swelling and states this usually improves with Lasix. However, she has not noticed any improvement despite increased Lasix dosing at [facility]. [name] denies orthopnea or PND. He is compliant with medications.  Past Medical History Past Medical History  Diagnosis Date  . Altered mental status     felt to be secondary to acute delirium on top of a suspected underlying dementia  . Fall     Fall with pelvic fracture  . Acute renal failure     from dehydration brought on by fall  . Pelvic fracture   . Depression   . Chronic systolic and diastolic heart failure   . Ischemic cardiomyopathy   . Coronary artery disease   . History of hypertension   . History of obesity   . Obesity   . Hypothyroidism   . B12 deficiency   . Mild aortic stenosis   . Dementia     Questionable dementia  . Complete heart block 05/26/2008    Annotation: status post dual-chamber pacemaker Qualifier: Diagnosis of  By: [name] Romney, MD, [name] Pacemaker-Dual- medtronic 05/21/2011    Past Surgical History Past Surgical History  Procedure Laterality Date  . Cholecystectomy    . Hemiarthroplasty hip  Aug 07, 2004    Left  . Circumcision  June 21, 2005    Allergies/Intolerances Allergies  Allergen Reactions  . Codeine     REACTION: unknown  . Lorazepam     REACTION: unkown  . Promethazine Hcl     REACTION: unknown    Current Home Medications Current Outpatient Prescriptions  Medication Sig Dispense Refill  . ALPRAZolam (XANAX) 0.25 MG tablet Take 0.25 mg by mouth as needed.        . carvedilol (COREG) 3.125 MG tablet Take 3.125 mg by mouth 2 (two) times daily with a meal.        . Cholecalciferol 5000 UNITS TABS Take 50,000 Units by mouth every 30 (thirty) days.        . clopidogrel (PLAVIX) 75 MG tablet Take 75 mg by mouth daily.        . Cyanocobalamin (VITAMIN B-12 IJ) Inject 1,000 mcg as directed every 30 (thirty) days.        . folic acid (FOLVITE) 0.5 MG tablet Take 1 mg by mouth daily.        . furosemide (LASIX) 40 MG tablet Take 40 mg by mouth daily.      Marland Kitchen glimepiride (AMARYL) 4 MG tablet Take 4 mg  by mouth daily before breakfast.        . levothyroxine (SYNTHROID, LEVOTHROID) 100 MCG tablet Take 100 mcg by mouth daily.        . sertraline (ZOLOFT) 50 MG tablet Take 50 mg by mouth daily.        . simvastatin (ZOCOR) 20 MG tablet Take 20 mg by mouth at bedtime.         No current facility-administered medications for this visit.    Social History Social History  . Marital Status: Married   Social History Main Topics  . Smoking status: Former Games developer  . Smokeless tobacco: Not on file  . Alcohol Use: No  . Drug Use: No   Review of Systems General: No chills, fever, night sweats or weight changes Cardiovascular: No chest pain, dyspnea on exertion, edema, orthopnea, palpitations, paroxysmal nocturnal dyspnea Dermatological: No rash, lesions or masses Respiratory: No cough,  dyspnea Urologic: No hematuria, dysuria Abdominal: No nausea, vomiting, diarrhea, bright red blood per rectum, melena, or hematemesis Neurologic: No visual changes, weakness, changes in mental status All other systems reviewed and are otherwise negative except as noted above.  Physical Exam Vitals: Blood pressure 114/69, pulse 65, height 5' 10.5" (1.791 m), weight 209 lb 12.8 oz (95.165 kg).  General: Well developed, well appearing, elderly 79 y.o. male in no acute distress. HEENT: Normocephalic, atraumatic. EOMs intact. Sclera nonicteric. Oropharynx clear.  Neck: Supple. No JVD. Lungs: Respirations regular and unlabored. Faint rales at bases bilaterally otherwise CTA bilaterally. No wheezes or rhonchi. Heart: RRR. S1, S2 present. No murmurs, rub, S3 or S4. Abdomen: Soft, non-tender, non-distended. BS present x 4 quadrants. No hepatosplenomegaly.  Extremities: No clubbing, cyanosis or edema. DP/PT/Radials 2+ and equal bilaterally. Psych: Normal affect. Neuro: Alert and oriented X 3. Moves all extremities spontaneously.   Diagnostics Recent labs from [facility] 01/17/2013 - WBC 5600, Hgb 11.9, Hct 34.7, PLT 106,000, sodium 137, potassium 4.3, BUN 41, Cr 1.97 Device interrogation today - Medtronic Sigma; battery at ERI since 12/07/2012; reverted to VVI 65 bpm  Assessment and Plan 1. PPM battery at [facility], has reverted to VVI pacing which may be aggravating his HF  2. CHB s/p PPM implant 3. Ischemic CM, EF 40-45% 4. Chronic systolic HF 5. CKD 6. Orthostatic hypotension with falls 7. Thrombocytopenia, chronic  [name] presents for EP follow-up. His PPM battery is at [facility]. He is pacemaker dependent. Considered upgrade to BiV PPM in setting of LV dysfunction; however, [name]'s HF symptoms seem relatively acute and worsening since reversion to VVI pacing. Although this is difficult to fully ascertain given [name]'s limited insight into, and understanding of, his cardiac condition.  After further discussion and conferring with Dr. Graciela Taylor, the decision was made to proceed with PPM generator change. Discussed the need for PPM generator change. Risks, benefits and alternatives to ICD generator change were discussed in detail with him and his daughter today. These risks include, but are not limited to, bleeding and infection. Mr. Westry and his daughter expressed verbal understanding and wish to proceed. This will be scheduled with Dr. Graciela Taylor as soon as possible.   Signed, Rick Duff, PA-C 01/22/2013, 12:21 PM

## 2013-01-25 ENCOUNTER — Encounter (HOSPITAL_COMMUNITY): Payer: Self-pay | Admitting: Pharmacy Technician

## 2013-01-25 ENCOUNTER — Telehealth: Payer: Self-pay | Admitting: Internal Medicine

## 2013-01-25 NOTE — Telephone Encounter (Signed)
Follow Up  Pt daughter returning call for labs//

## 2013-01-27 ENCOUNTER — Encounter (HOSPITAL_COMMUNITY)
Admission: RE | Disposition: A | Discharge status: Home / Self Care | Payer: Self-pay | Source: Ambulatory Visit | Attending: Internal Medicine

## 2013-01-27 ENCOUNTER — Telehealth: Payer: Self-pay | Admitting: Internal Medicine

## 2013-01-27 ENCOUNTER — Ambulatory Visit (HOSPITAL_COMMUNITY)
Admission: RE | Admit: 2013-01-27 | Discharge: 2013-01-27 | Disposition: A | Discharge status: Home / Self Care | Payer: PRIVATE HEALTH INSURANCE | Source: Ambulatory Visit | Attending: Internal Medicine | Admitting: Internal Medicine

## 2013-01-27 DIAGNOSIS — E119 Type 2 diabetes mellitus without complications: Secondary | ICD-10-CM | POA: Insufficient documentation

## 2013-01-27 DIAGNOSIS — E039 Hypothyroidism, unspecified: Secondary | ICD-10-CM | POA: Insufficient documentation

## 2013-01-27 DIAGNOSIS — I2589 Other forms of chronic ischemic heart disease: Secondary | ICD-10-CM | POA: Insufficient documentation

## 2013-01-27 DIAGNOSIS — I442 Atrioventricular block, complete: Secondary | ICD-10-CM | POA: Diagnosis present

## 2013-01-27 DIAGNOSIS — I951 Orthostatic hypotension: Secondary | ICD-10-CM | POA: Insufficient documentation

## 2013-01-27 DIAGNOSIS — I5022 Chronic systolic (congestive) heart failure: Secondary | ICD-10-CM | POA: Insufficient documentation

## 2013-01-27 DIAGNOSIS — Z45018 Encounter for adjustment and management of other part of cardiac pacemaker: Secondary | ICD-10-CM | POA: Insufficient documentation

## 2013-01-27 DIAGNOSIS — E785 Hyperlipidemia, unspecified: Secondary | ICD-10-CM

## 2013-01-27 DIAGNOSIS — N189 Chronic kidney disease, unspecified: Secondary | ICD-10-CM | POA: Insufficient documentation

## 2013-01-27 DIAGNOSIS — Z950 Presence of cardiac pacemaker: Secondary | ICD-10-CM

## 2013-01-27 DIAGNOSIS — I2510 Atherosclerotic heart disease of native coronary artery without angina pectoris: Secondary | ICD-10-CM | POA: Insufficient documentation

## 2013-01-27 DIAGNOSIS — I255 Ischemic cardiomyopathy: Secondary | ICD-10-CM

## 2013-01-27 DIAGNOSIS — Z45010 Encounter for checking and testing of cardiac pacemaker pulse generator [battery]: Secondary | ICD-10-CM

## 2013-01-27 HISTORY — DX: Essential (primary) hypertension: I10

## 2013-01-27 HISTORY — PX: PACEMAKER GENERATOR CHANGE: SHX5481

## 2013-01-27 LAB — SURGICAL PCR SCREEN: Staphylococcus aureus: NEGATIVE

## 2013-01-27 LAB — GLUCOSE, CAPILLARY: Glucose-Capillary: 183 mg/dL — ABNORMAL HIGH (ref 70–99)

## 2013-01-27 SURGERY — PACEMAKER GENERATOR CHANGE
Anesthesia: LOCAL

## 2013-01-27 MED ORDER — VANCOMYCIN HCL IN DEXTROSE 1-5 GM/200ML-% IV SOLN
1000.0000 mg | Freq: Once | INTRAVENOUS | Status: DC
  Filled 2013-01-27: qty 200

## 2013-01-27 MED ORDER — LIDOCAINE HCL (PF) 1 % IJ SOLN
INTRAMUSCULAR | Status: AC
  Filled 2013-01-27: qty 60

## 2013-01-27 MED ORDER — SODIUM CHLORIDE 0.9 % IV SOLN: 250.0000 mL | INTRAVENOUS | Status: DC | PRN

## 2013-01-27 MED ORDER — CEFAZOLIN SODIUM-DEXTROSE 2-3 GM-% IV SOLR: 2.0000 g | INTRAVENOUS | Status: DC

## 2013-01-27 MED ORDER — SODIUM CHLORIDE 0.9 % IJ SOLN: 3.0000 mL | Freq: Two times a day (BID) | INTRAMUSCULAR | Status: DC

## 2013-01-27 MED ORDER — SODIUM CHLORIDE 0.9 % IJ SOLN: 3.0000 mL | INTRAMUSCULAR | Status: DC | PRN

## 2013-01-27 MED ORDER — ONDANSETRON HCL 4 MG/2ML IJ SOLN: 4.0000 mg | Freq: Four times a day (QID) | INTRAMUSCULAR | Status: DC | PRN

## 2013-01-27 MED ORDER — SODIUM CHLORIDE 0.9 % IR SOLN
80.0000 mg | Status: DC
  Filled 2013-01-27: qty 2

## 2013-01-27 MED ORDER — SODIUM CHLORIDE 0.9 % IV SOLN
INTRAVENOUS | Status: DC
  Administered 2013-01-27: 14:00:00 via INTRAVENOUS

## 2013-01-27 MED ORDER — ACETAMINOPHEN 325 MG PO TABS: 325.0000 mg | ORAL_TABLET | ORAL | Status: DC | PRN

## 2013-01-27 MED ORDER — CHLORHEXIDINE GLUCONATE 4 % EX LIQD
60.0000 mL | Freq: Once | CUTANEOUS | Status: DC
  Filled 2013-01-27: qty 60

## 2013-01-27 MED ORDER — CEFAZOLIN SODIUM-DEXTROSE 2-3 GM-% IV SOLR
INTRAVENOUS | Status: AC
  Filled 2013-01-27: qty 50

## 2013-01-27 MED ORDER — MUPIROCIN 2 % EX OINT: TOPICAL_OINTMENT | Freq: Two times a day (BID) | CUTANEOUS | Status: DC

## 2013-01-27 MED ORDER — MUPIROCIN 2 % EX OINT
TOPICAL_OINTMENT | CUTANEOUS | Status: AC
  Administered 2013-01-27: 1 via NASAL
  Filled 2013-01-27: qty 22

## 2013-01-27 NOTE — H&P (View-Only) (Signed)
 ELECTROPHYSIOLOGY OFFICE NOTE  Patient ID: [name] MRN: [id number], DOB/AGE: 11/02/1919   Date of Visit: 01/22/2013  Primary Physician: GREEN, ARTHUR G, MD Primary Cardiologist / Primary EP: Traci Turner, MD / Steve Klein, MD Reason for Visit: EP / device follow-up  History of Present Illness  [name] is a 79 y.o. male with CHB s/p PPM implant, CAD s/p PCI to LAD in 2004, ischemic CM, EF 40-45% by echo in 2010, chronic systolic HF, CKD, hypothyroidism, DM and orthostatic hypotension with falls who presents today for electrophysiology/device follow-up. He is accompanied by his daughter who assists with history questions. His PPM battery is nearing ERI and he has been getting this checked monthly at [facility].  Since last being seen in our clinic, he reports he is doing "about the same" and has no complaints. He denies chest pain or shortness of breath. He denies palpitations, dizziness, near syncope or syncope. His daughter is concerned about persistent LE swelling and states this usually improves with Lasix. However, she has not noticed any improvement despite increased Lasix dosing at [facility]. [name] denies orthopnea or PND. He is compliant with medications.  Past Medical History Past Medical History  Diagnosis Date  . Altered mental status     felt to be secondary to acute delirium on top of a suspected underlying dementia  . Fall     Fall with pelvic fracture  . Acute renal failure     from dehydration brought on by fall  . Pelvic fracture   . Depression   . Chronic systolic and diastolic heart failure   . Ischemic cardiomyopathy   . Coronary artery disease   . History of hypertension   . History of obesity   . Obesity   . Hypothyroidism   . B12 deficiency   . Mild aortic stenosis   . Dementia     Questionable dementia  . Complete heart block 05/26/2008    Annotation: status post dual-chamber pacemaker Qualifier: Diagnosis of  By: Bensimhon, MD, FACC,  Daniel R   . Pacemaker-Dual- medtronic 05/21/2011    Past Surgical History Past Surgical History  Procedure Laterality Date  . Cholecystectomy    . Hemiarthroplasty hip  Aug 07, 2004    Left  . Circumcision  June 21, 2005    Allergies/Intolerances Allergies  Allergen Reactions  . Codeine     REACTION: unknown  . Lorazepam     REACTION: unkown  . Promethazine Hcl     REACTION: unknown    Current Home Medications Current Outpatient Prescriptions  Medication Sig Dispense Refill  . ALPRAZolam (XANAX) 0.25 MG tablet Take 0.25 mg by mouth as needed.        . carvedilol (COREG) 3.125 MG tablet Take 3.125 mg by mouth 2 (two) times daily with a meal.        . Cholecalciferol 5000 UNITS TABS Take 50,000 Units by mouth every 30 (thirty) days.        . clopidogrel (PLAVIX) 75 MG tablet Take 75 mg by mouth daily.        . Cyanocobalamin (VITAMIN B-12 IJ) Inject 1,000 mcg as directed every 30 (thirty) days.        . folic acid (FOLVITE) 0.5 MG tablet Take 1 mg by mouth daily.        . furosemide (LASIX) 40 MG tablet Take 40 mg by mouth daily.      . glimepiride (AMARYL) 4 MG tablet Take 4 mg   by mouth daily before breakfast.        . levothyroxine (SYNTHROID, LEVOTHROID) 100 MCG tablet Take 100 mcg by mouth daily.        . sertraline (ZOLOFT) 50 MG tablet Take 50 mg by mouth daily.        . simvastatin (ZOCOR) 20 MG tablet Take 20 mg by mouth at bedtime.         No current facility-administered medications for this visit.    Social History Social History  . Marital Status: Married   Social History Main Topics  . Smoking status: Former Smoker  . Smokeless tobacco: Not on file  . Alcohol Use: No  . Drug Use: No   Review of Systems General: No chills, fever, night sweats or weight changes Cardiovascular: No chest pain, dyspnea on exertion, edema, orthopnea, palpitations, paroxysmal nocturnal dyspnea Dermatological: No rash, lesions or masses Respiratory: No cough,  dyspnea Urologic: No hematuria, dysuria Abdominal: No nausea, vomiting, diarrhea, bright red blood per rectum, melena, or hematemesis Neurologic: No visual changes, weakness, changes in mental status All other systems reviewed and are otherwise negative except as noted above.  Physical Exam Vitals: Blood pressure 114/69, pulse 65, height 5' 10.5" (1.791 m), weight 209 lb 12.8 oz (95.165 kg).  General: Well developed, well appearing, elderly 79 y.o. male in no acute distress. HEENT: Normocephalic, atraumatic. EOMs intact. Sclera nonicteric. Oropharynx clear.  Neck: Supple. No JVD. Lungs: Respirations regular and unlabored. Faint rales at bases bilaterally otherwise CTA bilaterally. No wheezes or rhonchi. Heart: RRR. S1, S2 present. No murmurs, rub, S3 or S4. Abdomen: Soft, non-tender, non-distended. BS present x 4 quadrants. No hepatosplenomegaly.  Extremities: No clubbing, cyanosis or edema. DP/PT/Radials 2+ and equal bilaterally. Psych: Normal affect. Neuro: Alert and oriented X 3. Moves all extremities spontaneously.   Diagnostics Recent labs from [facility] 01/17/2013 - WBC 5600, Hgb 11.9, Hct 34.7, PLT 106,000, sodium 137, potassium 4.3, BUN 41, Cr 1.97 Device interrogation today - Medtronic Sigma; battery at ERI since 12/07/2012; reverted to VVI 65 bpm  Assessment and Plan 1. PPM battery at ERI, has reverted to VVI pacing which may be aggravating his HF  2. CHB s/p PPM implant 3. Ischemic CM, EF 40-45% 4. Chronic systolic HF 5. CKD 6. Orthostatic hypotension with falls 7. Thrombocytopenia, chronic  [name] presents for EP follow-up. His PPM battery is at ERI. He is pacemaker dependent. Considered upgrade to BiV PPM in setting of LV dysfunction; however, Mr. Mcreynolds's HF symptoms seem relatively acute and worsening since reversion to VVI pacing. Although this is difficult to fully ascertain given Mr. Narvaiz's limited insight into, and understanding of, his cardiac condition.  After further discussion and conferring with Dr. Klein, the decision was made to proceed with PPM generator change. Discussed the need for PPM generator change. Risks, benefits and alternatives to ICD generator change were discussed in detail with him and his daughter today. These risks include, but are not limited to, bleeding and infection. Mr. Martos and his daughter expressed verbal understanding and wish to proceed. This will be scheduled with Dr. Klein as soon as possible.   Signed, Jojuan Champney, PA-C 01/22/2013, 12:21 PM    

## 2013-01-27 NOTE — Telephone Encounter (Signed)
New PRoblem  Pt ate breakfast at 8:30 am and has a CathLab appt today at 2pm// Does he need to resch?// If so please call with rescheduled date and time.

## 2013-01-27 NOTE — Op Note (Signed)
SURGEON:  Hillis Range, MD     PREPROCEDURE DIAGNOSES:   1. Complete heart block    POSTPROCEDURE DIAGNOSES:   1. Complete heart block    PROCEDURES:   1. Pacemaker pulse generator replacement.   2. Skin pocket revision.     INTRODUCTION:  [name] is a 79 y.o. male with a history of complete heart block. He has done well since his pacemaker was implanted.  He has recently reached ERI battery status.  He presents today for pacemaker pulse generator replacement.       DESCRIPTION OF THE PROCEDURE:  Informed written consent was obtained, and the patient was brought to the electrophysiology lab in the fasting state.  The patient's pacemaker was interrogated today and found to be at elective replacement indicator battery status.  The patient required no sedation for the procedure today.  The patient's left chest was prepped and draped in the usual sterile fashion by the EP lab staff.  The skin overlying the existing pacemaker was infiltrated with lidocaine for local analgesia.  A 4-cm incision was made over the pacemaker pocket.  Using a combination of sharp and blunt dissection, the pacemaker was exposed and removed from the body.  The device was disconnected from the leads.  There was no foreign matter or debris within the pocket.  The atrial lead was confirmed to be a Medtronic model [id number] (serial number [id number]) lead implanted on 12/23/2002.  The right ventricular lead was confirmed to be a Medtronic model [id number] (serial number [id number]) lead implanted on the same date as the atrial lead (above).  Both leads were examined and their integrity was confirmed to be intact.  Atrial lead P-waves measured 1.2 mV with impedance of 511 ohms and a threshold of 0.5 V at 0.5 msec.  Right ventricular lead R-waves were no present due to CHB.  There RV lead impedance was 593 ohms and a threshold of 0.9 V at 0.5 msec.  Both leads were connected to a Medtronic Cartwright model [id number] (serial number [id number])  pacemaker.  The pocket was revised to accommodate this new device.  Electrocautery was required to assure hemostasis.  The pocket was irrigated with copious gentamicin solution. The pacemaker was then placed into the pocket.  The pocket was then closed in 2 layers with 2-0 Vicryl suture over the subcutaneous and subcuticular layers.  Steri-Strips and a sterile dressing were then applied.  There were no early apparent complications.     CONCLUSIONS:   1. Successful pacemaker pulse generator replacement for elective replacement indicator battery status   2. No early apparent complications.     Hillis Range, MD 01/27/2013 4:04 PM

## 2013-01-27 NOTE — Telephone Encounter (Signed)
Spoke to the assisted living place  HE did not eat after breakfast and they did send him to the hospital for procedure

## 2013-01-27 NOTE — Progress Notes (Signed)
REPORT CALLED TO MICHELLE NURSE AT [facility]

## 2013-01-27 NOTE — Interval H&P Note (Signed)
History and Physical Interval Note:  01/27/2013 2:47 PM  [name]  has presented today for surgery, with the diagnosis of End of life  The various methods of treatment have been discussed with the patient and family. After consideration of risks, benefits and other options for treatment, the patient has consented to  Procedure(s): PACEMAKER GENERATOR CHANGE (N/A) as a surgical intervention .  The patient's history has been reviewed, patient examined, no change in status, stable for surgery.  I have reviewed the patient's chart and labs.  Questions were answered to the patient's satisfaction.     Hillis Range

## 2013-01-27 NOTE — Progress Notes (Signed)
PER DR ALLRED OK TO D/C HOME NOW

## 2013-01-27 NOTE — Addendum Note (Signed)
Addended by: Minda Meo on: 01/27/2013 07:40 AM   Modules accepted: Orders

## 2013-01-31 ENCOUNTER — Encounter (HOSPITAL_COMMUNITY): Payer: Self-pay | Admitting: *Deleted

## 2013-02-02 NOTE — Telephone Encounter (Signed)
New Problem  Daughter called states that she was advised nurse was supposed to come to [facility] to undress his wounds// Requests a call back to discuss

## 2013-02-03 ENCOUNTER — Non-Acute Institutional Stay (SKILLED_NURSING_FACILITY): Payer: PRIVATE HEALTH INSURANCE | Admitting: Internal Medicine

## 2013-02-03 DIAGNOSIS — E1159 Type 2 diabetes mellitus with other circulatory complications: Secondary | ICD-10-CM

## 2013-02-03 DIAGNOSIS — I10 Essential (primary) hypertension: Secondary | ICD-10-CM

## 2013-02-03 DIAGNOSIS — E039 Hypothyroidism, unspecified: Secondary | ICD-10-CM

## 2013-02-03 DIAGNOSIS — I5022 Chronic systolic (congestive) heart failure: Secondary | ICD-10-CM

## 2013-02-03 NOTE — Telephone Encounter (Signed)
I spoke with Lowella Bandy at [facility] - patient only has steris in place, there is no dressing. I called patient's daughter and explained this to her. She asked if someone is going to come to [facility] to do wound check on him - I explained I was unsure, that he is scheduled 11/19 for his wound check. I will check with device clinic on this matter and get back with her. Patient's daughter agreeable to plan.

## 2013-02-05 NOTE — Telephone Encounter (Signed)
I first called [facility] and spoke with Reuel Boom (taking care of patient), I explained that someone from device clinic would be there next week on 11/19 to do wound check on patient. I also called Darl Pikes (emergency contact/daughter) and let her know. They both verbalized understanding.

## 2013-02-06 NOTE — Progress Notes (Signed)
PROGRESS NOTE  DATE: 02/03/13  FACILITY: [facility]  LEVEL OF CARE: SNF  Routine Visit  CHIEF COMPLAINT:  Manage CHF, hypothyroidism and hypertension  HISTORY OF PRESENT ILLNESS:  REASSESSMENT OF ONGOING PROBLEMS:  CHF:The patient does not relate significant weight changes, denies sob, DOE, orthopnea, PNDs, palpitations or chest pain.  CHF remains stable.  No complications form the medications being used.  Patient is concerned that his bilateral lower extremity swelling is persistent.   HYPOTHYROIDISM: The hypothyroidism remains stable. No complications noted from the medications presently being used.  The patient denies fatigue or constipation.  Last TSH 0.984 in 11/13, in 10/14 TSH 4.455.  HTN: Pt 's HTN remains stable.  Denies CP, sob, DOE, headaches, dizziness or visual disturbances.  No complications from the meds currently being used.  Last BP : 118/69, 120/75, 130/70, 114/66, 118/69, 137/69.  PAST MEDICAL HISTORY : Reviewed.  No changes.  CURRENT MEDICATIONS: Reviewed per [facility]  REVIEW OF SYSTEMS:  GENERAL: no change in appetite, no fatigue, no weight changes, no fever, chills or weakness RESPIRATORY: no cough, SOB, DOE,wheezing, hemoptysis CARDIAC: no chest pain, or palpitations.  Bilateral lower extremity swelling present. GI: no abdominal pain, diarrhea, constipation, heart burn, nausea or vomiting  PHYSICAL EXAMINATION  VS:  T 97.1     P 65     RR 20    BP 137/69     POX % 95     WT (Lb)  GENERAL: no acute distress, normal body habitus EYES: normal sclerae, normal conjunctivae, no discharge NECK: supple, trachea midline, no neck masses, no thyroid tenderness, no thyromegaly LYMPHATICS: no cervical LAN, no supraclavicular LAN RESPIRATORY: breathing is even & unlabored, BS CTAB CARDIAC: RRR, no murmur,no extra heart sounds, +2 bilateral lower extremity edema GI: abdomen soft, normal BS, no masses, no tenderness, no hepatomegaly, no splenomegaly PSYCHIATRIC:  the patient is alert & oriented to person, affect & behavior appropriate  LABS/RADIOLOGY:  10/14 glc 185, bun 29, cr 1.4 ow cmp nl, HbA1c 7.4, Hb 12.7, mcv 94.3, plt 129, wbc 5.4, 2D echo showed 65%, repeat labs: Hemoglobin 11.9, MCV 90.4, platelets 106, WBC 5.6, glucose 236, BUN 41, creatinine 1.97 otherwise BMP normal  4/14 glucose 237, BUN 31, creatinine 1.52 otherwise BMP normal  3/14 BNP 406.3, glucose 256, BUN 44, creatinine 1.63 otherwise BMP normal  2/14 hemoglobin A1c 7.2 11/13 WBC 10.8, hemoglobin 12.3, MCV 89.1, platelets 116, glucose 174, BUN 41, creatinine 1.66 otherwise CMP normal, fasting lipid panel normal  ASSESSMENT/PLAN:  CHF-well compensated hypothyroidism-well-controlled. hypertension-stable diabetes mellitus- uncontrolled. amaryl increased to 6mg  qd. anxiety-well controlled. vitamin B12 deficiency-continue supplementation. ARF-likely due to lasix.  Monitor. Anemia of chronic kidney dz-stable.  CPT CODE: 16109

## 2013-02-08 ENCOUNTER — Encounter: Payer: Self-pay | Admitting: Internal Medicine

## 2013-02-08 ENCOUNTER — Ambulatory Visit (INDEPENDENT_AMBULATORY_CARE_PROVIDER_SITE_OTHER): Payer: PRIVATE HEALTH INSURANCE | Admitting: *Deleted

## 2013-02-08 DIAGNOSIS — I4892 Unspecified atrial flutter: Secondary | ICD-10-CM

## 2013-02-08 DIAGNOSIS — I442 Atrioventricular block, complete: Secondary | ICD-10-CM

## 2013-02-08 LAB — MDC_IDC_ENUM_SESS_TYPE_INCLINIC
Brady Statistic AP VS Percent: 0.1 %
Brady Statistic AS VP Percent: 19 %
Brady Statistic AS VS Percent: 0.1 %
Lead Channel Impedance Value: 539 Ohm
Lead Channel Pacing Threshold Amplitude: 1 V
Lead Channel Sensing Intrinsic Amplitude: 1.4 mV
Lead Channel Setting Pacing Amplitude: 2.5 V
Lead Channel Setting Pacing Pulse Width: 0.4 ms

## 2013-02-08 NOTE — Progress Notes (Signed)
Wound check appointment. Steri-strips removed. Wound without redness or edema. Incision edges approximated, wound well healed. Normal device function. Thresholds, sensing, and impedances consistent with implant measurements. Device programmed at 3.5V/auto capture programmed on for extra safety margin until 3 month visit. Histogram distribution appropriate for patient and level of activity. No mode switches or high ventricular rates noted. Patient educated about wound care, arm mobility, lifting restrictions. ROV in 3 months with SK. 

## 2013-02-10 ENCOUNTER — Ambulatory Visit: Payer: PRIVATE HEALTH INSURANCE

## 2013-03-03 ENCOUNTER — Non-Acute Institutional Stay (SKILLED_NURSING_FACILITY): Payer: PRIVATE HEALTH INSURANCE | Admitting: Internal Medicine

## 2013-03-03 DIAGNOSIS — E1159 Type 2 diabetes mellitus with other circulatory complications: Secondary | ICD-10-CM

## 2013-03-03 DIAGNOSIS — E039 Hypothyroidism, unspecified: Secondary | ICD-10-CM

## 2013-03-03 DIAGNOSIS — I10 Essential (primary) hypertension: Secondary | ICD-10-CM

## 2013-03-03 DIAGNOSIS — I5022 Chronic systolic (congestive) heart failure: Secondary | ICD-10-CM

## 2013-03-05 ENCOUNTER — Encounter: Payer: Self-pay | Admitting: Internal Medicine

## 2013-03-05 NOTE — Progress Notes (Signed)
PROGRESS NOTE  DATE: 03/03/13  FACILITY: [facility]  LEVEL OF CARE: SNF  Routine Visit  CHIEF COMPLAINT:  Manage CHF, hypothyroidism and hypertension  HISTORY OF PRESENT ILLNESS:  REASSESSMENT OF ONGOING PROBLEMS:  CHF:The patient does not relate significant weight changes, denies sob, DOE, orthopnea, PNDs, palpitations or chest pain.  CHF remains stable.  No complications form the medications being used.  Patient is concerned that his bilateral lower extremity swelling is persistent.   HYPOTHYROIDISM: The hypothyroidism remains stable. No complications noted from the medications presently being used.  The patient denies fatigue or constipation.  Last TSH 0.984 in 11/13, in 10/14 TSH 4.455.  HTN: Pt 's HTN remains stable.  Denies CP, sob, DOE, headaches, dizziness or visual disturbances.  No complications from the meds currently being used.  Last BP : 118/69, 120/75, 130/70, 114/66, 118/69, 137/69, 109/55.  PAST MEDICAL HISTORY : Reviewed.  No changes.  CURRENT MEDICATIONS: Reviewed per [facility]  REVIEW OF SYSTEMS:  GENERAL: no change in appetite, no fatigue, no weight changes, no fever, chills or weakness RESPIRATORY: no cough, SOB, DOE,wheezing, hemoptysis CARDIAC: no chest pain, or palpitations.  Bilateral lower extremity swelling present. GI: no abdominal pain, diarrhea, constipation, heart burn, nausea or vomiting  PHYSICAL EXAMINATION  VS:  T 99.7     P 62     RR 20    BP 109/55     POX % 96     WT (Lb)  GENERAL: no acute distress, normal body habitus EYES: normal sclerae, normal conjunctivae, no discharge NECK: supple, trachea midline, no neck masses, no thyroid tenderness, no thyromegaly LYMPHATICS: no cervical LAN, no supraclavicular LAN RESPIRATORY: breathing is even & unlabored, BS CTAB CARDIAC: RRR, no murmur,no extra heart sounds, +2 bilateral lower extremity edema GI: abdomen soft, normal BS, no masses, no tenderness, no hepatomegaly, no  splenomegaly PSYCHIATRIC: the patient is alert & oriented to person, affect & behavior appropriate  LABS/RADIOLOGY:  12-14 glucose 223, BUN 40, creatinine 1.5 otherwise BMP normal, hemoglobin 12.3, MCV 90.9 otherwise CBC normal, hemoglobin A1c 8.5  10/14 glc 185, bun 29, cr 1.4 ow cmp nl, HbA1c 7.4, Hb 12.7, mcv 94.3, plt 129, wbc 5.4, 2D echo showed 65%, repeat labs: Hemoglobin 11.9, MCV 90.4, platelets 106, WBC 5.6, glucose 236, BUN 41, creatinine 1.97 otherwise BMP normal  4/14 glucose 237, BUN 31, creatinine 1.52 otherwise BMP normal  3/14 BNP 406.3, glucose 256, BUN 44, creatinine 1.63 otherwise BMP normal  2/14 hemoglobin A1c 7.2 11/13 WBC 10.8, hemoglobin 12.3, MCV 89.1, platelets 116, glucose 174, BUN 41, creatinine 1.66 otherwise CMP normal, fasting lipid panel normal  ASSESSMENT/PLAN:  CHF-well compensated. zaroxylyn discontinued. Lasix changed. hypothyroidism-well-controlled. hypertension-stable diabetes mellitus- uncontrolled. amaryl decreased due to hypoglycemia. anxiety-well controlled. vitamin B12 deficiency-continue supplementation. ARF-likely due to lasix.  Monitor. Anemia of chronic kidney dz-stable.  CPT CODE: 96045

## 2013-03-23 ENCOUNTER — Encounter: Payer: PRIVATE HEALTH INSURANCE | Admitting: Cardiology

## 2013-03-31 ENCOUNTER — Non-Acute Institutional Stay (SKILLED_NURSING_FACILITY): Payer: PRIVATE HEALTH INSURANCE | Admitting: Internal Medicine

## 2013-03-31 DIAGNOSIS — I5022 Chronic systolic (congestive) heart failure: Secondary | ICD-10-CM

## 2013-03-31 DIAGNOSIS — E1159 Type 2 diabetes mellitus with other circulatory complications: Secondary | ICD-10-CM

## 2013-03-31 DIAGNOSIS — E039 Hypothyroidism, unspecified: Secondary | ICD-10-CM

## 2013-03-31 DIAGNOSIS — I10 Essential (primary) hypertension: Secondary | ICD-10-CM

## 2013-03-31 NOTE — Progress Notes (Signed)
PROGRESS NOTE  DATE: 03-31-13   FACILITY: [facility]  LEVEL OF CARE: SNF  Routine Visit  CHIEF COMPLAINT:  Manage CHF, hypothyroidism and hypertension  HISTORY OF PRESENT ILLNESS:  REASSESSMENT OF ONGOING PROBLEMS:  CHF:The patient does not relate significant weight changes, denies sob, DOE, orthopnea, PNDs, palpitations or chest pain.  CHF remains stable.  No complications form the medications being used.  Patient is concerned that his bilateral lower extremity swelling is persistent.   HYPOTHYROIDISM: The hypothyroidism remains stable. No complications noted from the medications presently being used.  The patient denies fatigue or constipation.  Last TSH 0.984 in 11/13, in 10/14 TSH 4.455.  HTN: Pt 's HTN remains stable.  Denies CP, sob, DOE, headaches, dizziness or visual disturbances.  No complications from the meds currently being used.  Last BP : 118/69, 120/75, 130/70, 114/66, 118/69, 137/69, 109/55.  PAST MEDICAL HISTORY : Reviewed.  No changes.  CURRENT MEDICATIONS: Reviewed per [facility]  REVIEW OF SYSTEMS:  GENERAL: no change in appetite, no fatigue, no weight changes, no fever, chills or weakness RESPIRATORY: no cough, SOB, DOE,wheezing, hemoptysis CARDIAC: no chest pain, or palpitations.  Bilateral lower extremity swelling present. GI: no abdominal pain, diarrhea, heart burn, nausea or vomiting, complains of constipation  PHYSICAL EXAMINATION  VS:  T 99.7     P 62     RR 20    BP 109/55     POX % 96       GENERAL: no acute distress, normal body habitus EYES: normal sclerae, normal conjunctivae, no discharge NECK: supple, trachea midline, no neck masses, no thyroid tenderness, no thyromegaly LYMPHATICS: no cervical LAN, no supraclavicular LAN RESPIRATORY: breathing is even & unlabored, BS CTAB CARDIAC: RRR, no murmur,no extra heart sounds, +2 bilateral lower extremity edema GI: abdomen soft, normal BS, no masses, no tenderness, no hepatomegaly, no  splenomegaly PSYCHIATRIC: the patient is alert & oriented to person, affect & behavior appropriate  LABS/RADIOLOGY:  12-14 glucose 223, BUN 40, creatinine 1.5 otherwise BMP normal, hemoglobin 12.3, MCV 90.9 otherwise CBC normal, hemoglobin A1c 8.5  10/14 glc 185, bun 29, cr 1.4 ow cmp nl, HbA1c 7.4, Hb 12.7, mcv 94.3, plt 129, wbc 5.4, 2D echo showed 65%, repeat labs: Hemoglobin 11.9, MCV 90.4, platelets 106, WBC 5.6, glucose 236, BUN 41, creatinine 1.97 otherwise BMP normal  4/14 glucose 237, BUN 31, creatinine 1.52 otherwise BMP normal  3/14 BNP 406.3, glucose 256, BUN 44, creatinine 1.63 otherwise BMP normal  2/14 hemoglobin A1c 7.2 11/13 WBC 10.8, hemoglobin 12.3, MCV 89.1, platelets 116, glucose 174, BUN 41, creatinine 1.66 otherwise CMP normal, fasting lipid panel normal  ASSESSMENT/PLAN:  CHF-Lasix was increased. hypothyroidism-well-controlled. hypertension-stable diabetes mellitus- uncontrolled. amaryl decreased due to hypoglycemia. anxiety-well controlled. vitamin B12 deficiency-continue supplementation. ARF-likely due to lasix.  Monitor. Anemia of chronic kidney dz-stable. Constipation-new problem. Start MiraLax 17 g daily Allergic rhinitis-Claritin was started Acute pharyngitis-on amoxicillin.  CPT CODE: 90383

## 2013-04-19 ENCOUNTER — Telehealth: Payer: Self-pay | Admitting: Internal Medicine

## 2013-04-19 NOTE — Telephone Encounter (Signed)
New Message  Pt was in need of a sooner appt because the BNT has elevated as well as the BUN.. .. Made appt for 01/27 at 3:15 please. Call if needed//SR

## 2013-04-19 NOTE — Telephone Encounter (Signed)
Reviewed with Dr. Graciela Husbands who recommends pt go to ED for evaluation. I spoke with pt's daughter and gave her these instructions.

## 2013-04-19 NOTE — Telephone Encounter (Signed)
Spoke with pt's daughter who is requesting appointment with Dr. Graciela Husbands today. Pt has appt tomorrow. Daughter reports pt has been sick for 3 months. He has heart failure and has not been eating or sleeping. She reports he is losing weight and has fallen twice in the last week. She reports he has hurting in chest which may be indigestion but has been going on for last day. Daughter reports symptoms have been worsening every day for the last week.  Will review with Dr. Graciela Husbands. Daughter is Darl Pikes and call back number is [phone]

## 2013-04-20 ENCOUNTER — Ambulatory Visit (INDEPENDENT_AMBULATORY_CARE_PROVIDER_SITE_OTHER): Payer: PRIVATE HEALTH INSURANCE | Admitting: Internal Medicine

## 2013-04-20 ENCOUNTER — Encounter: Payer: Self-pay | Admitting: Internal Medicine

## 2013-04-20 VITALS — BP 120/69 | HR 67 | Ht 72.0 in | Wt 204.5 lb

## 2013-04-20 DIAGNOSIS — L03119 Cellulitis of unspecified part of limb: Secondary | ICD-10-CM

## 2013-04-20 DIAGNOSIS — I255 Ischemic cardiomyopathy: Secondary | ICD-10-CM

## 2013-04-20 DIAGNOSIS — L03116 Cellulitis of left lower limb: Secondary | ICD-10-CM | POA: Insufficient documentation

## 2013-04-20 DIAGNOSIS — I4892 Unspecified atrial flutter: Secondary | ICD-10-CM

## 2013-04-20 DIAGNOSIS — I5022 Chronic systolic (congestive) heart failure: Secondary | ICD-10-CM

## 2013-04-20 DIAGNOSIS — I2589 Other forms of chronic ischemic heart disease: Secondary | ICD-10-CM

## 2013-04-20 DIAGNOSIS — L02419 Cutaneous abscess of limb, unspecified: Secondary | ICD-10-CM

## 2013-04-20 DIAGNOSIS — L03115 Cellulitis of right lower limb: Secondary | ICD-10-CM | POA: Insufficient documentation

## 2013-04-20 DIAGNOSIS — I442 Atrioventricular block, complete: Secondary | ICD-10-CM

## 2013-04-20 DIAGNOSIS — Z950 Presence of cardiac pacemaker: Secondary | ICD-10-CM

## 2013-04-20 LAB — MDC_IDC_ENUM_SESS_TYPE_INCLINIC
Battery Impedance: 100 Ohm
Battery Voltage: 2.79 V
Brady Statistic AP VP Percent: 5 %
Brady Statistic AP VS Percent: 0 %
Brady Statistic AS VP Percent: 95 %
Lead Channel Impedance Value: 500 Ohm
Lead Channel Pacing Threshold Amplitude: 1.25 V
Lead Channel Sensing Intrinsic Amplitude: 0.7 mV
Lead Channel Setting Pacing Amplitude: 2.5 V
Lead Channel Setting Pacing Pulse Width: 0.4 ms
MDC IDC MSMT BATTERY REMAINING LONGEVITY: 115 mo
MDC IDC MSMT LEADCHNL RA PACING THRESHOLD PULSEWIDTH: 0.4 ms
MDC IDC MSMT LEADCHNL RV IMPEDANCE VALUE: 597 Ohm
MDC IDC MSMT LEADCHNL RV PACING THRESHOLD AMPLITUDE: 0.5 V
MDC IDC MSMT LEADCHNL RV PACING THRESHOLD PULSEWIDTH: 0.4 ms
MDC IDC SESS DTM: 20150127165141
MDC IDC SET LEADCHNL RA PACING AMPLITUDE: 2 V
MDC IDC SET LEADCHNL RV SENSING SENSITIVITY: 4 mV
MDC IDC STAT BRADY AS VS PERCENT: 0 %

## 2013-04-20 NOTE — Progress Notes (Signed)
Patient Care Team: Kimber RelicArthur G Green, MD as PCP - General (Internal Medicine)   HPI  [name] is a 80 y.o. male With a history of complete heart block status post pacemaker implantation pacemaker generator replacement 11/14  Has had a number of challenges in the last couple of months with pneumonia and worsening edema associated with erythema over his shins.  Past Medical History  Diagnosis Date  . Altered mental status     felt to be secondary to acute delirium on top of a suspected underlying dementia  . Fall     Fall with pelvic fracture  . Acute renal failure     from dehydration brought on by fall  . Pelvic fracture   . Depression   . Chronic systolic and diastolic heart failure   . Ischemic cardiomyopathy   . Coronary artery disease   . Hypertension   . Obesity   . Hypothyroidism   . B12 deficiency   . Mild aortic stenosis   . Dementia     Questionable dementia  . Complete heart block     status post dual-chamber pacemaker    Past Surgical History  Procedure Laterality Date  . Cholecystectomy    . Hemiarthroplasty hip  Aug 07, 2004    Left  . Circumcision  June 21, 2005  . Pacemaker insertion  2004; 01/2013    MDT pacemaker implanted 2004; gen change by Dr Johney FrameAllred to MDT [id number] 01/2013    Current Outpatient Prescriptions  Medication Sig Dispense Refill  . carvedilol (COREG) 3.125 MG tablet Take 3.125 mg by mouth 2 (two) times daily with a meal.        . clopidogrel (PLAVIX) 75 MG tablet Take 75 mg by mouth daily.        . Cyanocobalamin (VITAMIN B-12 IJ) Inject 1,000 mcg as directed every 30 (thirty) days. On the 26th of every month      . folic acid (FOLVITE) 1 MG tablet Take 1 mg by mouth daily.      . furosemide (LASIX) 40 MG tablet Take 20-40 mg by mouth 2 (two) times daily. Take 40mg  tablet at 8am and take 20mg  (1/2 tablet) at 5pm      . glimepiride (AMARYL) 4 MG tablet Take 6 mg by mouth daily before breakfast.       . levothyroxine  (SYNTHROID, LEVOTHROID) 100 MCG tablet Take 100 mcg by mouth daily.        . sertraline (ZOLOFT) 25 MG tablet Take 25 mg by mouth daily.      . Vitamin D, Ergocalciferol, (DRISDOL) 50000 UNITS CAPS capsule Take 50,000 Units by mouth every 30 (thirty) days. On the 24th of every month       No current facility-administered medications for this visit.    Allergies  Allergen Reactions  . Codeine     REACTION: unknown  . Lorazepam     REACTION: unkown  . Promethazine Hcl     REACTION: unknown    Review of Systems negative except from HPI and PMH  Physical Exam BP 120/69  Pulse 67  Ht 6' (1.829 m)  Wt 204 lb 8 oz (92.761 kg)  BMI 27.73 kg/m2 Well developed and nourished in no acute distress HENT normal  edentulous Neck supple  Clear Regular rate and rhythm, no murmurs or gallops Abd-soft with active BS No Clubbing cyanosis 3+ edema Skin-warm and dry  Erythema over shins A   Grossly normal sensory and  motor function     Assessment and  Plan

## 2013-04-20 NOTE — Assessment & Plan Note (Signed)
I will give him one week's worth of Keflex for this. It is likely associated with his brawny edema. Will increase his diuretics for 5 days.

## 2013-04-20 NOTE — Patient Instructions (Addendum)
Your physician has recommended you make the following change in your medication:  1) Start Keflex for 1 week 2) Increase Lasix to 40 twice daily for 5 days, then return to normal dosage 3) Start Eliquis 2.5 mg twice daily  Remote monitoring is used to monitor your Pacemaker of ICD from home. This monitoring reduces the number of office visits required to check your device to one time per year. It allows [place] to keep an eye on the functioning of your device to ensure it is working properly. You are scheduled for a device check from home on 07/22/13. You may send your transmission at any time that day. If you have a wireless device, the transmission will be sent automatically. After your physician reviews your transmission, you will receive a postcard with your next transmission date.  Your physician wants you to follow-up in: October with Dr. Graciela Husbands.  You will receive a reminder letter in the mail two months in advance. If you don't receive a letter, please call our office to schedule the follow-up appointment.  Your physician recommends that you schedule a follow-up appointment in: 3-4 weeks with Rick Duff, PAC/[place], [facility]

## 2013-04-20 NOTE — Assessment & Plan Note (Signed)
The patient's device was interrogated.  The information was reviewed. No changes were made in the programming.    

## 2013-04-20 NOTE — Assessment & Plan Note (Signed)
Significant volume overload As above

## 2013-04-20 NOTE — Assessment & Plan Note (Signed)
Continue current meds 

## 2013-04-20 NOTE — Assessment & Plan Note (Addendum)
The patient is now persistent atrial fibrillation. Reviewing his old pacemaker interrogation this was not evident. It is new since November but fairly persistent since then. It may be contributing to his congestive heart failure. Anticoagulation and cardioversion may be of benefit.  He is elderly and his creatinine is greater than 1.5 to use apixaban 2.5 mg twice daily and anticipate cardioversion in 3-4 weeks

## 2013-05-17 ENCOUNTER — Encounter: Payer: PRIVATE HEALTH INSURANCE | Admitting: Internal Medicine

## 2013-05-19 ENCOUNTER — Encounter: Payer: PRIVATE HEALTH INSURANCE | Admitting: Cardiology

## 2013-05-20 ENCOUNTER — Other Ambulatory Visit: Payer: Self-pay | Admitting: *Deleted

## 2013-05-20 MED ORDER — ALPRAZOLAM 0.5 MG PO TABS: ORAL_TABLET | ORAL | Status: DC

## 2013-05-20 NOTE — Telephone Encounter (Signed)
[facility] 

## 2013-05-28 ENCOUNTER — Encounter: Payer: Self-pay | Admitting: *Deleted

## 2013-06-01 ENCOUNTER — Encounter: Payer: PRIVATE HEALTH INSURANCE | Admitting: Cardiology

## 2013-06-16 ENCOUNTER — Other Ambulatory Visit: Payer: Self-pay | Admitting: *Deleted

## 2013-06-16 MED ORDER — ALPRAZOLAM 0.5 MG PO TABS: ORAL_TABLET | ORAL | Status: DC

## 2013-06-16 NOTE — Telephone Encounter (Signed)
[facility] 

## 2013-06-22 ENCOUNTER — Encounter: Payer: Self-pay | Admitting: Cardiology

## 2013-06-22 ENCOUNTER — Ambulatory Visit (INDEPENDENT_AMBULATORY_CARE_PROVIDER_SITE_OTHER): Payer: PRIVATE HEALTH INSURANCE | Admitting: Cardiology

## 2013-06-22 VITALS — BP 118/70 | HR 68

## 2013-06-22 DIAGNOSIS — I509 Heart failure, unspecified: Secondary | ICD-10-CM

## 2013-06-22 DIAGNOSIS — I255 Ischemic cardiomyopathy: Secondary | ICD-10-CM

## 2013-06-22 DIAGNOSIS — I2589 Other forms of chronic ischemic heart disease: Secondary | ICD-10-CM

## 2013-06-22 DIAGNOSIS — I5022 Chronic systolic (congestive) heart failure: Secondary | ICD-10-CM

## 2013-06-22 DIAGNOSIS — I5023 Acute on chronic systolic (congestive) heart failure: Secondary | ICD-10-CM

## 2013-06-22 DIAGNOSIS — I4891 Unspecified atrial fibrillation: Secondary | ICD-10-CM

## 2013-06-22 DIAGNOSIS — I442 Atrioventricular block, complete: Secondary | ICD-10-CM

## 2013-06-22 LAB — MDC_IDC_ENUM_SESS_TYPE_INCLINIC
Battery Impedance: 100 Ohm
Battery Voltage: 2.79 V
Lead Channel Impedance Value: 604 Ohm
Lead Channel Setting Pacing Pulse Width: 0.4 ms
Lead Channel Setting Sensing Sensitivity: 4 mV
MDC IDC MSMT BATTERY REMAINING LONGEVITY: 132 mo
MDC IDC MSMT LEADCHNL RA IMPEDANCE VALUE: 67 Ohm
MDC IDC SESS DTM: 20150331173527
MDC IDC SET LEADCHNL RV PACING AMPLITUDE: 2.5 V
MDC IDC STAT BRADY RV PERCENT PACED: 100 %

## 2013-06-22 MED ORDER — FUROSEMIDE 40 MG PO TABS: ORAL_TABLET | ORAL | Status: DC

## 2013-06-22 NOTE — Progress Notes (Signed)
ELECTROPHYSIOLOGY OFFICE NOTE   Patient ID: [name] MRN: [id number], DOB/AGE: [birth year]   Date of Visit: 06/22/2013  Primary Physician: Kimber Relic, MD Primary Cardiologist / Primary EP: Armanda Magic, MD / Berton Mount, MD  Reason for Visit: EP / device follow-up   History of Present Illness  [name] is a 80 y.o. man with CHB s/p PPM implant, CAD s/p PCI to LAD in 2004, ischemic CM, EF 40-45% by echo in 2010, chronic systolic HF, CKD, hypothyroidism, DM and orthostatic hypotension with falls who presents today for electrophysiology/device follow-up. He was found to have persistent atrial fibrillation back in January. He was also volume overloaded and Dr. Graciela Husbands adjusted his diuretic dose. He presents today for follow-up. He is accompanied by his daughter who assists with history questions. He lives at [facility].   Since last being seen in our clinic, he reports he is doing "about the same" and has no new complaints. He is still bothered by LE swelling despite twice daily dosing of Lasix. He does not ambulate much and he declines PT. He denies chest pain or shortness of breath. He denies palpitations, dizziness, near syncope or syncope. [name] denies orthopnea or PND. He is compliant with medications; however, he admits to sneaking salt onto his food at [facility].   Past Medical History Past Medical History  Diagnosis Date  . Altered mental status     felt to be secondary to acute delirium on top of a suspected underlying dementia  . Fall     Fall with pelvic fracture  . Acute renal failure     from dehydration brought on by fall  . Pelvic fracture   . Depression   . Chronic systolic and diastolic heart failure   . Ischemic cardiomyopathy   . Coronary artery disease   . Hypertension   . Obesity   . Hypothyroidism   . B12 deficiency   . Mild aortic stenosis   . Dementia     Questionable dementia  . Complete heart block     status post dual-chamber  pacemaker    Past Surgical History Past Surgical History  Procedure Laterality Date  . Cholecystectomy    . Hemiarthroplasty hip  Aug 07, 2004    Left  . Circumcision  June 21, 2005  . Pacemaker insertion  2004; 01/2013    MDT pacemaker implanted 2004; gen change by Dr Johney Frame to MDT [id number] 01/2013    Allergies/Intolerances Allergies  Allergen Reactions  . Ativan [Lorazepam]     REACTION: unkown  . Codeine     REACTION: unknown  . Promethazine Hcl     REACTION: unknown    Current Home Medications Current Outpatient Prescriptions  Medication Sig Dispense Refill  . ALPRAZolam (XANAX) 0.5 MG tablet Take one tablet by mouth every night at bedtime for anxiety/insomnia  30 tablet  5  . apixaban (ELIQUIS) 2.5 MG TABS tablet Take 2.5 mg by mouth 2 (two) times daily.      . B Complex-C-Folic Acid (RENA-VITE PO) Take 1 tablet by mouth daily.      . carvedilol (COREG) 3.125 MG tablet Take 3.125 mg by mouth 2 (two) times daily with a meal. For HTN      . clopidogrel (PLAVIX) 75 MG tablet Take 75 mg by mouth daily.        . Cyanocobalamin (VITAMIN B-12 IJ) Inject 1,000 mcg as directed every 30 (thirty) days. On the 26th of every month      .  digoxin (LANOXIN) 0.125 MG tablet Take 0.125 mg by mouth daily.      Marland Kitchen docusate sodium (COLACE) 100 MG capsule Take 100 mg by mouth 2 (two) times daily. For constipation      . folic acid (FOLVITE) 1 MG tablet Take 1 mg by mouth 2 (two) times daily. For CHF      . furosemide (LASIX) 40 MG tablet Take 40 mg by mouth daily.       . insulin glargine (LANTUS) 100 UNIT/ML injection Inject 10 Units into the skin at bedtime.      Marland Kitchen levothyroxine (SYNTHROID, LEVOTHROID) 100 MCG tablet Take 100 mcg by mouth daily.        Marland Kitchen loratadine (CLARITIN) 10 MG tablet Take 10 mg by mouth daily.      . pantoprazole (PROTONIX) 40 MG tablet Take 40 mg by mouth daily.      . sertraline (ZOLOFT) 25 MG tablet Take 25 mg by mouth daily. For depression      . Vitamin D,  Ergocalciferol, (DRISDOL) 50000 UNITS CAPS capsule Take 50,000 Units by mouth every 30 (thirty) days. On the 24th of every month       No current facility-administered medications for this visit.    Social History History   Social History  . Marital Status: Married    Spouse Name: N/A    Number of Children: N/A  . Years of Education: N/A   Occupational History  . Not on file.   Social History Main Topics  . Smoking status: Former Games developer  . Smokeless tobacco: Not on file  . Alcohol Use: No  . Drug Use: No  . Sexual Activity: Not on file   Other Topics Concern  . Not on file   Social History Narrative  . No narrative on file     Review of Systems General: No chills, fever, night sweats or weight changes Cardiovascular: No chest pain, dyspnea on exertion, edema, orthopnea, palpitations, paroxysmal nocturnal dyspnea Dermatological: No rash, lesions or masses Respiratory: No cough, dyspnea Urologic: No hematuria, dysuria Abdominal: No nausea, vomiting, diarrhea, bright red blood per rectum, melena, or hematemesis Neurologic: No visual changes, weakness, changes in mental status All other systems reviewed and are otherwise negative except as noted above.  Physical Exam Vitals: Blood pressure 118/70, pulse 68.  General: Well developed, well appearing, elderly 80 y.o. male in no acute distress. In wheelchair. HEENT: Normocephalic, atraumatic. EOMs intact. Sclera nonicteric. Oropharynx clear.  Neck: Supple. No JVD. Lungs: Respirations regular and unlabored, CTA bilaterally. No wheezes, rales or rhonchi. Heart: Regular. S1, S2 present. No murmurs, rub, S3 or S4. Abdomen: Soft, non-distended. Extremities: No clubbing or cyanosis. 1-2+ pedal edema bilaterally. PT/Radials 2+ and equal bilaterally. Psych: Normal affect. Neuro: Alert and oriented X 3. Moves all extremities spontaneously.   Diagnostics Recent labs from [facility]  05/27/2013 - sodium 137, potassium 3.8, BUN  42, Cr 1.5  12-lead ECG today - V paced at 70 bpm with underlying atrial fibrillation Device interrogation today - quick look for episodes - programmed VVIR with underlying atrial fibrillation and CHB; 2 high ventricular rate episodes, longest 5 seconds, max V rate 160 bpm, EGMs most consistent with NSVT  Assessment and Plan  1. CHB s/p PPM implant  2. Ischemic CM, EF 40-45%  3. Chronic systolic HF  4. Permanent atrial fibrillation 5. CKD  6. Orthostatic hypotension with falls  7. Thrombocytopenia, chronic  Increase Lasix to 60 mg in AM, 40 mg in PM Low  sodium diet (no more sneaking salt onto food) and daily weights  Elevate legs whenever in seated position Repeat BMET in one week Follow-up in 6 weeks  Signed, Yeira Gulden, PA-C 06/22/2013, 5:10 PM

## 2013-06-22 NOTE — Patient Instructions (Addendum)
Your physician recommends that you schedule a follow-up appointment in: 6 weeks with Rick Duff    Your physician has recommended you make the following change in your medication:  1. Increase you Lasix to 60 mg in the morning 40 mg evening for 5 days then resume 40 mg daily  Your physician recommends that you return for lab work in: 1 week BMET  2 Gram Low Sodium Diet A 2 gram sodium diet restricts the amount of sodium in the diet to no more than 2 g or 2000 mg daily. Limiting the amount of sodium is often used to help lower blood pressure. It is important if you have heart, liver, or kidney problems. Many foods contain sodium for flavor and sometimes as a preservative. When the amount of sodium in a diet needs to be low, it is important to know what to look for when choosing foods and drinks. The following includes some information and guidelines to help make it easier for you to adapt to a low sodium diet. QUICK TIPS  Do not add salt to food.  Avoid convenience items and fast food.  Choose unsalted snack foods.  Buy lower sodium products, often labeled as "lower sodium" or "no salt added."  Check food labels to learn how much sodium is in 1 serving.  When eating at a restaurant, ask that your food be prepared with less salt or none, if possible. READING FOOD LABELS FOR SODIUM INFORMATION The nutrition facts label is a good place to find how much sodium is in foods. Look for products with no more than 500 to 600 mg of sodium per meal and no more than 150 mg per serving. Remember that 2 g = 2000 mg. The food label may also list foods as:  Sodium-free: Less than 5 mg in a serving.  Very low sodium: 35 mg or less in a serving.  Low-sodium: 140 mg or less in a serving.  Light in sodium: 50% less sodium in a serving. For example, if a food that usually has 300 mg of sodium is changed to become light in sodium, it will have 150 mg of sodium.  Reduced sodium: 25% less sodium in a  serving. For example, if a food that usually has 400 mg of sodium is changed to reduced sodium, it will have 300 mg of sodium. CHOOSING FOODS Grains  Avoid: Salted crackers and snack items. Some cereals, including instant hot cereals. Bread stuffing and biscuit mixes. Seasoned rice or pasta mixes.  Choose: Unsalted snack items. Low-sodium cereals, oats, puffed wheat and rice, shredded wheat. English muffins and bread. Pasta. Meats  Avoid: Salted, canned, smoked, spiced, pickled meats, including fish and poultry. Bacon, ham, sausage, cold cuts, hot dogs, anchovies.  Choose: Low-sodium canned tuna and salmon. Fresh or frozen meat, poultry, and fish. Dairy  Avoid: Processed cheese and spreads. Cottage cheese. Buttermilk and condensed milk. Regular cheese.  Choose: Milk. Low-sodium cottage cheese. Yogurt. Sour cream. Low-sodium cheese. Fruits and Vegetables  Avoid: Regular canned vegetables. Regular canned tomato sauce and paste. Frozen vegetables in sauces. Olives. Rosita Fire. Relishes. Sauerkraut.  Choose: Low-sodium canned vegetables. Low-sodium tomato sauce and paste. Frozen or fresh vegetables. Fresh and frozen fruit. Condiments  Avoid: Canned and packaged gravies. Worcestershire sauce. Tartar sauce. Barbecue sauce. Soy sauce. Steak sauce. Ketchup. Onion, garlic, and table salt. Meat flavorings and tenderizers.  Choose: Fresh and dried herbs and spices. Low-sodium varieties of mustard and ketchup. Lemon juice. Tabasco sauce. Horseradish. SAMPLE 2 GRAM SODIUM MEAL  PLAN Breakfast / Sodium (mg)  1 cup low-fat milk / 143 mg  2 slices whole-wheat toast / 270 mg  1 tbs heart-healthy margarine / 153 mg  1 hard-boiled egg / 139 mg  1 small orange / 0 mg Lunch / Sodium (mg)  1 cup raw carrots / 76 mg   cup hummus / 298 mg  1 cup low-fat milk / 143 mg   cup red grapes / 2 mg  1 whole-wheat pita bread / 356 mg Dinner / Sodium (mg)  1 cup whole-wheat pasta / 2 mg  1 cup  low-sodium tomato sauce / 73 mg  3 oz lean ground beef / 57 mg  1 small side salad (1 cup raw spinach leaves,  cup cucumber,  cup yellow bell pepper) with 1 tsp olive oil and 1 tsp red wine vinegar / 25 mg Snack / Sodium (mg)  1 container low-fat vanilla yogurt / 107 mg  3 graham cracker squares / 127 mg Nutrient Analysis  Calories: 2033  Protein: 77 g  Carbohydrate: 282 g  Fat: 72 g  Sodium: 1971 mg Document Released: 03/11/2005 Document Revised: 06/03/2011 Document Reviewed: 06/12/2009 Exit[employer] Patient Information 2014 [place]

## 2013-07-12 ENCOUNTER — Non-Acute Institutional Stay (SKILLED_NURSING_FACILITY): Payer: PRIVATE HEALTH INSURANCE | Admitting: Internal Medicine

## 2013-07-12 ENCOUNTER — Encounter: Payer: Self-pay | Admitting: Internal Medicine

## 2013-07-12 DIAGNOSIS — I5022 Chronic systolic (congestive) heart failure: Secondary | ICD-10-CM

## 2013-07-12 DIAGNOSIS — E039 Hypothyroidism, unspecified: Secondary | ICD-10-CM

## 2013-07-12 DIAGNOSIS — I10 Essential (primary) hypertension: Secondary | ICD-10-CM

## 2013-07-12 DIAGNOSIS — E1159 Type 2 diabetes mellitus with other circulatory complications: Secondary | ICD-10-CM

## 2013-07-12 NOTE — Progress Notes (Signed)
          PROGRESS NOTE  DATE: 07-12-13   FACILITY: [facility]  LEVEL OF CARE: SNF  Routine Visit  CHIEF COMPLAINT:  Manage CHF, hypothyroidism and hypertension  HISTORY OF PRESENT ILLNESS:  REASSESSMENT OF ONGOING PROBLEMS:  CHF:The patient does not relate significant weight changes, denies sob, DOE, orthopnea, PNDs, palpitations or chest pain.  CHF remains stable.  No complications form the medications being used.  Patient is concerned that his bilateral lower extremity swelling is persistent.   HYPOTHYROIDISM: The hypothyroidism remains stable. No complications noted from the medications presently being used.  The patient denies fatigue or constipation.  Last TSH 0.984 in 11/13, in 10/14 TSH 4.455.  HTN: Pt 's HTN remains stable.  Denies CP, sob, DOE, headaches, dizziness or visual disturbances.  No complications from the meds currently being used.  Last BP : 118/69, 120/75, 130/70, 114/66, 118/69, 137/69, 109/55, 102/51.  PAST MEDICAL HISTORY : Reviewed.  No changes.  CURRENT MEDICATIONS: Reviewed per [facility]  REVIEW OF SYSTEMS:  GENERAL: no change in appetite, no fatigue, no weight changes, no fever, chills or weakness RESPIRATORY: no cough, SOB, DOE,wheezing, hemoptysis CARDIAC: no chest pain, or palpitations.  Bilateral lower extremity swelling present. GI: no abdominal pain, diarrhea, heart burn, nausea or vomiting, complains of constipation  PHYSICAL EXAMINATION  VS: see VS section   GENERAL: no acute distress, normal body habitus EYES: normal sclerae, normal conjunctivae, no discharge NECK: supple, trachea midline, no neck masses, no thyroid tenderness, no thyromegaly LYMPHATICS: no cervical LAN, no supraclavicular LAN RESPIRATORY: breathing is even & unlabored, BS CTAB CARDIAC: RRR, no murmur,no extra heart sounds, +2 bilateral lower extremity edema GI: abdomen soft, normal BS, no masses, no tenderness, no hepatomegaly, no splenomegaly PSYCHIATRIC: the patient is  alert & oriented to person, affect & behavior appropriate  LABS/RADIOLOGY: 4/15 glc 149, bun 40, cr 1.7 ow bmp nl 3/15 HbA1c 8.1 1/15 Hb 12.4, mcv 93.4, plt 139, wbc 6  12-14 glucose 223, BUN 40, creatinine 1.5 otherwise BMP normal, hemoglobin 12.3, MCV 90.9 otherwise CBC normal, hemoglobin A1c 8.5  10/14 glc 185, bun 29, cr 1.4 ow cmp nl, HbA1c 7.4, Hb 12.7, mcv 94.3, plt 129, wbc 5.4, 2D echo showed 65%, repeat labs: Hemoglobin 11.9, MCV 90.4, platelets 106, WBC 5.6, glucose 236, BUN 41, creatinine 1.97 otherwise BMP normal  4/14 glucose 237, BUN 31, creatinine 1.52 otherwise BMP normal  3/14 BNP 406.3, glucose 256, BUN 44, creatinine 1.63 otherwise BMP normal  2/14 hemoglobin A1c 7.2 11/13 WBC 10.8, hemoglobin 12.3, MCV 89.1, platelets 116, glucose 174, BUN 41, creatinine 1.66 otherwise CMP normal, fasting lipid panel normal  ASSESSMENT/PLAN:  CHF-Lasix was increased. hypothyroidism-well-controlled.  Check TSH. hypertension-stable diabetes mellitus- uncontrolled. lantus was increased anxiety-well controlled. vitamin B12 deficiency-continue supplementation. ARF-recheck pending Anemia of chronic kidney dz-stable. Constipation-well controlled. Allergic rhinitis-denies sx  CPT CODE: 33435  Newton Pigg. Kerry Dory, MD [facility] [phone]

## 2013-07-22 ENCOUNTER — Encounter: Payer: PRIVATE HEALTH INSURANCE | Admitting: *Deleted

## 2013-08-12 ENCOUNTER — Non-Acute Institutional Stay (SKILLED_NURSING_FACILITY): Payer: PRIVATE HEALTH INSURANCE | Admitting: Internal Medicine

## 2013-08-12 DIAGNOSIS — I5022 Chronic systolic (congestive) heart failure: Secondary | ICD-10-CM

## 2013-08-12 DIAGNOSIS — E1159 Type 2 diabetes mellitus with other circulatory complications: Secondary | ICD-10-CM

## 2013-08-12 DIAGNOSIS — I10 Essential (primary) hypertension: Secondary | ICD-10-CM

## 2013-08-12 DIAGNOSIS — E039 Hypothyroidism, unspecified: Secondary | ICD-10-CM

## 2013-08-13 NOTE — Progress Notes (Signed)
           PROGRESS NOTE  DATE: 08-12-13   FACILITY: [facility]  LEVEL OF CARE: SNF  Routine Visit  CHIEF COMPLAINT:  Manage CHF, hypothyroidism and hypertension  HISTORY OF PRESENT ILLNESS:  REASSESSMENT OF ONGOING PROBLEMS:  CHF:The patient does not relate significant weight changes, denies sob, DOE, orthopnea, PNDs, palpitations or chest pain.  CHF remains stable.  No complications form the medications being used.  Patient is concerned that his bilateral lower extremity swelling is persistent.   HYPOTHYROIDISM: The hypothyroidism remains stable. No complications noted from the medications presently being used.  The patient denies fatigue or constipation.  Last TSH 0.984 in 11/13, in 10/14 TSH 4.455, in 4-15 TSH 4.472.  HTN: Pt 's HTN remains stable.  Denies CP, sob, DOE, headaches, dizziness or visual disturbances.  No complications from the meds currently being used.  Last BP : 118/69, 120/75, 130/70, 114/66, 118/69, 137/69, 109/55, 102/51, 120/78.  PAST MEDICAL HISTORY : Reviewed.  No changes.  CURRENT MEDICATIONS: Reviewed per [facility]  REVIEW OF SYSTEMS:  GENERAL: no change in appetite, no fatigue, no weight changes, no fever, chills or weakness RESPIRATORY: no cough, SOB, DOE,wheezing, hemoptysis CARDIAC: no chest pain, or palpitations.  Bilateral lower extremity swelling present. GI: no abdominal pain, diarrhea, heart burn, nausea or vomiting, complains of constipation  PHYSICAL EXAMINATION  VS: see VS section   GENERAL: no acute distress, normal body habitus EYES: normal sclerae, normal conjunctivae, no discharge NECK: supple, trachea midline, no neck masses, no thyroid tenderness, no thyromegaly LYMPHATICS: no cervical LAN, no supraclavicular LAN RESPIRATORY: breathing is even & unlabored, BS CTAB CARDIAC: RRR, no murmur,no extra heart sounds, +2 bilateral lower extremity edema GI: abdomen soft, normal BS, no masses, no tenderness, no hepatomegaly, no  splenomegaly PSYCHIATRIC: the patient is alert & oriented to person, affect & behavior appropriate  LABS/RADIOLOGY: 5-15 glucose 209, BUN 40, creatinine 1.6 otherwise BMP normal 4/15 glc 149, bun 40, cr 1.7 ow bmp nl 3/15 HbA1c 8.1 1/15 Hb 12.4, mcv 93.4, plt 139, wbc 6  12-14 glucose 223, BUN 40, creatinine 1.5 otherwise BMP normal, hemoglobin 12.3, MCV 90.9 otherwise CBC normal, hemoglobin A1c 8.5  10/14 glc 185, bun 29, cr 1.4 ow cmp nl, HbA1c 7.4, Hb 12.7, mcv 94.3, plt 129, wbc 5.4, 2D echo showed 65%, repeat labs: Hemoglobin 11.9, MCV 90.4, platelets 106, WBC 5.6, glucose 236, BUN 41, creatinine 1.97 otherwise BMP normal  4/14 glucose 237, BUN 31, creatinine 1.52 otherwise BMP normal  3/14 BNP 406.3, glucose 256, BUN 44, creatinine 1.63 otherwise BMP normal  2/14 hemoglobin A1c 7.2 11/13 WBC 10.8, hemoglobin 12.3, MCV 89.1, platelets 116, glucose 174, BUN 41, creatinine 1.66 otherwise CMP normal, fasting lipid panel normal  ASSESSMENT/PLAN:  CHF-Lasix was increased. hypothyroidism-well-controlled.  hypertension-stable diabetes mellitus- uncontrolled. lantus was increased and Amaryl discontinued. anxiety-well controlled. vitamin B12 deficiency-continue supplementation. ARF-renal function stable Anemia of chronic kidney dz-stable. Constipation-well controlled. Allergic rhinitis-denies sx Check  liver profile and digoxin level  CPT CODE: 30160  [name]. Kerry Dory, MD [facility] [phone]

## 2013-08-17 ENCOUNTER — Ambulatory Visit: Payer: PRIVATE HEALTH INSURANCE | Admitting: Cardiology

## 2013-08-20 ENCOUNTER — Encounter: Payer: Self-pay | Admitting: Cardiology

## 2013-08-30 ENCOUNTER — Encounter: Payer: Self-pay | Admitting: *Deleted

## 2013-08-31 ENCOUNTER — Non-Acute Institutional Stay (SKILLED_NURSING_FACILITY): Payer: PRIVATE HEALTH INSURANCE | Admitting: Internal Medicine

## 2013-08-31 DIAGNOSIS — E039 Hypothyroidism, unspecified: Secondary | ICD-10-CM

## 2013-08-31 DIAGNOSIS — I5022 Chronic systolic (congestive) heart failure: Secondary | ICD-10-CM

## 2013-08-31 DIAGNOSIS — I10 Essential (primary) hypertension: Secondary | ICD-10-CM

## 2013-08-31 DIAGNOSIS — E1159 Type 2 diabetes mellitus with other circulatory complications: Secondary | ICD-10-CM

## 2013-08-31 NOTE — Progress Notes (Signed)
          PROGRESS NOTE  DATE: 08-31-13   FACILITY: [facility]  LEVEL OF CARE: SNF  Routine Visit  CHIEF COMPLAINT:  Manage CHF, hypothyroidism and hypertension  HISTORY OF PRESENT ILLNESS:  REASSESSMENT OF ONGOING PROBLEMS:  CHF:The patient does not relate significant weight changes, denies sob, DOE, orthopnea, PNDs, palpitations or chest pain.  CHF remains stable.  No complications form the medications being used.  Patient is concerned that his bilateral lower extremity swelling is persistent.   HYPOTHYROIDISM: The hypothyroidism remains stable. No complications noted from the medications presently being used.  The patient denies fatigue or constipation.  Last TSH 0.984 in 11/13, in 10/14 TSH 4.455, in 4-15 TSH 4.472.  HTN: Pt 's HTN remains stable.  Denies CP, sob, DOE, headaches, dizziness or visual disturbances.  No complications from the meds currently being used.  Last BP : 118/69, 120/75, 130/70, 114/66, 118/69, 137/69, 109/55, 102/51, 120/78, 110/72.  PAST MEDICAL HISTORY : Reviewed.  No changes.  CURRENT MEDICATIONS: Reviewed per [facility]  REVIEW OF SYSTEMS:  GENERAL: no change in appetite, no fatigue, no weight changes, no fever, chills or weakness RESPIRATORY: no cough, SOB, DOE,wheezing, hemoptysis CARDIAC: no chest pain, or palpitations.  Bilateral lower extremity swelling present. GI: no abdominal pain, diarrhea, heart burn, nausea or vomiting, complains of constipation  PHYSICAL EXAMINATION  VS: see VS section   GENERAL: no acute distress, normal body habitus EYES: normal sclerae, normal conjunctivae, no discharge NECK: supple, trachea midline, no neck masses, no thyroid tenderness, no thyromegaly LYMPHATICS: no cervical LAN, no supraclavicular LAN RESPIRATORY: breathing is even & unlabored, BS CTAB CARDIAC: RRR, no murmur,no extra heart sounds, +2 bilateral lower extremity edema GI: abdomen soft, normal BS, no masses, no tenderness, no hepatomegaly, no  splenomegaly PSYCHIATRIC: the patient is alert & oriented to person, affect & behavior appropriate  LABS/RADIOLOGY: 6-15 hemoglobin 12.5, MCV 92.9, platelets 112, WBC 5.1, hemoglobin A1c 9.1, glucose 263, BUN 38, creatinine 1.7 otherwise BMP normal 5-15 glucose 209, BUN 40, creatinine 1.6 otherwise BMP normal, liver profile normal, and digoxin level 1.3 4/15 glc 149, bun 40, cr 1.7 ow bmp nl 3/15 HbA1c 8.1 1/15 Hb 12.4, mcv 93.4, plt 139, wbc 6  12-14 glucose 223, BUN 40, creatinine 1.5 otherwise BMP normal, hemoglobin 12.3, MCV 90.9 otherwise CBC normal, hemoglobin A1c 8.5  10/14 glc 185, bun 29, cr 1.4 ow cmp nl, HbA1c 7.4, Hb 12.7, mcv 94.3, plt 129, wbc 5.4, 2D echo showed 65%, repeat labs: Hemoglobin 11.9, MCV 90.4, platelets 106, WBC 5.6, glucose 236, BUN 41, creatinine 1.97 otherwise BMP normal  4/14 glucose 237, BUN 31, creatinine 1.52 otherwise BMP normal  3/14 BNP 406.3, glucose 256, BUN 44, creatinine 1.63 otherwise BMP normal  2/14 hemoglobin A1c 7.2 11/13 WBC 10.8, hemoglobin 12.3, MCV 89.1, platelets 116, glucose 174, BUN 41, creatinine 1.66 otherwise CMP normal, fasting lipid panel normal  ASSESSMENT/PLAN:  CHF-Lasix was decreased. hypothyroidism-well-controlled.  hypertension-stable diabetes mellitus- uncontrolled. lantus was increased and pre-meal NovoLog adjusted anxiety-well controlled. vitamin B12 deficiency-continue supplementation. CKD-renal function stable Anemia of chronic kidney dz-stable. Constipation-well controlled. Allergic rhinitis-denies sx  CPT CODE: 30940  Newton Pigg. Kerry Dory, MD [facility] [phone]

## 2013-09-02 ENCOUNTER — Encounter: Payer: PRIVATE HEALTH INSURANCE | Admitting: Cardiology

## 2013-10-07 ENCOUNTER — Non-Acute Institutional Stay (SKILLED_NURSING_FACILITY): Payer: PRIVATE HEALTH INSURANCE | Admitting: Internal Medicine

## 2013-10-07 ENCOUNTER — Other Ambulatory Visit: Payer: Self-pay | Admitting: *Deleted

## 2013-10-07 DIAGNOSIS — I5022 Chronic systolic (congestive) heart failure: Secondary | ICD-10-CM

## 2013-10-07 DIAGNOSIS — E1159 Type 2 diabetes mellitus with other circulatory complications: Secondary | ICD-10-CM

## 2013-10-07 DIAGNOSIS — I10 Essential (primary) hypertension: Secondary | ICD-10-CM

## 2013-10-07 DIAGNOSIS — E039 Hypothyroidism, unspecified: Secondary | ICD-10-CM

## 2013-10-07 MED ORDER — ALPRAZOLAM 0.5 MG PO TABS
ORAL_TABLET | ORAL | Status: DC
Start: 2013-10-07 — End: 2014-01-27

## 2013-10-07 NOTE — Telephone Encounter (Signed)
[facility] 

## 2013-10-08 NOTE — Progress Notes (Signed)
          PROGRESS NOTE  DATE: 10-07-13   FACILITY: [facility]  LEVEL OF CARE: SNF  Routine Visit  CHIEF COMPLAINT:  Manage CHF, hypothyroidism and hypertension  HISTORY OF PRESENT ILLNESS:  REASSESSMENT OF ONGOING PROBLEMS:  CHF:The patient does not relate significant weight changes, denies sob, DOE, orthopnea, PNDs, palpitations or chest pain.  CHF remains stable.  No complications form the medications being used.  Patient is concerned that his bilateral lower extremity swelling is persistent.   HYPOTHYROIDISM: The hypothyroidism remains stable. No complications noted from the medications presently being used.  The patient denies fatigue or constipation.  Last TSH 0.984 in 11/13, in 10/14 TSH 4.455, in 4-15 TSH 4.472.  HTN: Pt 's HTN remains stable.  Denies CP, sob, DOE, headaches, dizziness or visual disturbances.  No complications from the meds currently being used.  Last BP : 118/69, 120/75, 130/70, 114/66, 118/69, 137/69, 109/55, 102/51, 120/78, 110/72, 111/48.  PAST MEDICAL HISTORY : Reviewed.  No changes.  CURRENT MEDICATIONS: Reviewed per [facility]  REVIEW OF SYSTEMS:  GENERAL: no change in appetite, no fatigue, no weight changes, no fever, chills or weakness RESPIRATORY: no cough, SOB, DOE,wheezing, hemoptysis CARDIAC: no chest pain, or palpitations.  Bilateral lower extremity swelling present. GI: no abdominal pain, diarrhea, heart burn, nausea or vomiting, complains of constipation  PHYSICAL EXAMINATION  VS: see VS section   GENERAL: no acute distress, normal body habitus EYES: normal sclerae, normal conjunctivae, no discharge NECK: supple, trachea midline, no neck masses, no thyroid tenderness, no thyromegaly LYMPHATICS: no cervical LAN, no supraclavicular LAN RESPIRATORY: breathing is even & unlabored, BS CTAB CARDIAC: RRR, no murmur,no extra heart sounds, +2 bilateral lower extremity edema GI: abdomen soft, normal BS, no masses, no tenderness, no hepatomegaly, no  splenomegaly PSYCHIATRIC: the patient is alert & oriented to person, affect & behavior appropriate  LABS/RADIOLOGY: 6-15 hemoglobin 12.5, MCV 92.9, platelets 112, WBC 5.1, hemoglobin A1c 9.1, glucose 263, BUN 38, creatinine 1.7 otherwise BMP normal, HDL 27, triglycerides 289, total cholesterol 154, LDL 69 5-15 glucose 209, BUN 40, creatinine 1.6 otherwise BMP normal, liver profile normal, and digoxin level 1.3 4/15 glc 149, bun 40, cr 1.7 ow bmp nl 3/15 HbA1c 8.1 1/15 Hb 12.4, mcv 93.4, plt 139, wbc 6  12-14 glucose 223, BUN 40, creatinine 1.5 otherwise BMP normal, hemoglobin 12.3, MCV 90.9 otherwise CBC normal, hemoglobin A1c 8.5  10/14 glc 185, bun 29, cr 1.4 ow cmp nl, HbA1c 7.4, Hb 12.7, mcv 94.3, plt 129, wbc 5.4, 2D echo showed 65%, repeat labs: Hemoglobin 11.9, MCV 90.4, platelets 106, WBC 5.6, glucose 236, BUN 41, creatinine 1.97 otherwise BMP normal  4/14 glucose 237, BUN 31, creatinine 1.52 otherwise BMP normal  3/14 BNP 406.3, glucose 256, BUN 44, creatinine 1.63 otherwise BMP normal  2/14 hemoglobin A1c 7.2 11/13 WBC 10.8, hemoglobin 12.3, MCV 89.1, platelets 116, glucose 174, BUN 41, creatinine 1.66 otherwise CMP normal, fasting lipid panel normal  ASSESSMENT/PLAN:  CHF-compensated hypothyroidism-well-controlled.  hypertension-stable diabetes mellitus- uncontrolled. lantus was increased. anxiety-Xanax was added. vitamin B12 deficiency-continue supplementation. CKD-renal function stable Anemia of chronic kidney dz-stable. Constipation-well controlled. Allergic rhinitis-denies sx Depression -- Zoloft was increased Hyperlipidemia-TriCor was added  CPT CODE: 69678  Newton Pigg. Kerry Dory, MD [facility] [phone]

## 2013-11-16 ENCOUNTER — Encounter: Payer: Self-pay | Admitting: *Deleted

## 2013-11-18 ENCOUNTER — Non-Acute Institutional Stay (SKILLED_NURSING_FACILITY): Payer: PRIVATE HEALTH INSURANCE | Admitting: Internal Medicine

## 2013-11-18 DIAGNOSIS — E039 Hypothyroidism, unspecified: Secondary | ICD-10-CM

## 2013-11-18 DIAGNOSIS — E1159 Type 2 diabetes mellitus with other circulatory complications: Secondary | ICD-10-CM

## 2013-11-18 DIAGNOSIS — I10 Essential (primary) hypertension: Secondary | ICD-10-CM

## 2013-11-18 DIAGNOSIS — I5022 Chronic systolic (congestive) heart failure: Secondary | ICD-10-CM

## 2013-11-22 NOTE — Progress Notes (Signed)
          PROGRESS NOTE  DATE: 11-18-13   FACILITY: [facility]  LEVEL OF CARE: SNF  Routine Visit  CHIEF COMPLAINT:  Manage CHF, hypothyroidism and hypertension  HISTORY OF PRESENT ILLNESS:  REASSESSMENT OF ONGOING PROBLEMS:  CHF:The patient does not relate significant weight changes, denies sob, DOE, orthopnea, PNDs, palpitations or chest pain.  CHF remains stable.  No complications form the medications being used.  Patient is concerned that his bilateral lower extremity swelling is persistent.   HYPOTHYROIDISM: The hypothyroidism remains stable. No complications noted from the medications presently being used.  The patient denies fatigue or constipation.  Last TSH 0.984 in 11/13, in 10/14 TSH 4.455, in 4-15 TSH 4.472.  HTN: Pt 's HTN remains stable.  Denies CP, sob, DOE, headaches, dizziness or visual disturbances.  No complications from the meds currently being used.  Last BP : 118/69, 120/75, 130/70, 114/66, 118/69, 137/69, 109/55, 102/51, 120/78, 110/72, 111/48, 119/60.  PAST MEDICAL HISTORY : Reviewed.  No changes.  CURRENT MEDICATIONS: Reviewed per [facility]  REVIEW OF SYSTEMS:  GENERAL: no change in appetite, no fatigue, no weight changes, no fever, chills or weakness RESPIRATORY: no cough, SOB, DOE,wheezing, hemoptysis CARDIAC: no chest pain, or palpitations.  Bilateral lower extremity swelling present. GI: no abdominal pain, diarrhea, heart burn, nausea or vomiting, complains of constipation  PHYSICAL EXAMINATION  VS: see VS section   GENERAL: no acute distress, normal body habitus EYES: normal sclerae, normal conjunctivae, no discharge NECK: supple, trachea midline, no neck masses, no thyroid tenderness, no thyromegaly LYMPHATICS: no cervical LAN, no supraclavicular LAN RESPIRATORY: breathing is even & unlabored, BS CTAB CARDIAC: RRR, no murmur,no extra heart sounds, +2 bilateral lower extremity edema GI: abdomen soft, normal BS, no masses, no tenderness, no  hepatomegaly, no splenomegaly PSYCHIATRIC: the patient is alert & oriented to person, affect & behavior appropriate  LABS/RADIOLOGY: 7-15 glucose 267, BUN 30, creatinine 1.9 otherwise BMP normal 6-15 hemoglobin 12.5, MCV 92.9, platelets 112, WBC 5.1, hemoglobin A1c 9.1, glucose 263, BUN 38, creatinine 1.7 otherwise BMP normal, HDL 27, triglycerides 289, total cholesterol 154, LDL 69 5-15 glucose 209, BUN 40, creatinine 1.6 otherwise BMP normal, liver profile normal, and digoxin level 1.3 4/15 glc 149, bun 40, cr 1.7 ow bmp nl 3/15 HbA1c 8.1 1/15 Hb 12.4, mcv 93.4, plt 139, wbc 6  12-14 glucose 223, BUN 40, creatinine 1.5 otherwise BMP normal, hemoglobin 12.3, MCV 90.9 otherwise CBC normal, hemoglobin A1c 8.5  10/14 glc 185, bun 29, cr 1.4 ow cmp nl, HbA1c 7.4, Hb 12.7, mcv 94.3, plt 129, wbc 5.4, 2D echo showed 65%, repeat labs: Hemoglobin 11.9, MCV 90.4, platelets 106, WBC 5.6, glucose 236, BUN 41, creatinine 1.97 otherwise BMP normal  4/14 glucose 237, BUN 31, creatinine 1.52 otherwise BMP normal  3/14 BNP 406.3, glucose 256, BUN 44, creatinine 1.63 otherwise BMP normal  2/14 hemoglobin A1c 7.2 11/13 WBC 10.8, hemoglobin 12.3, MCV 89.1, platelets 116, glucose 174, BUN 41, creatinine 1.66 otherwise CMP normal, fasting lipid panel normal  ASSESSMENT/PLAN:  CHF-compensated hypothyroidism-well-controlled.  hypertension-stable diabetes mellitus- uncontrolled. lantus was increased. Anxiety-stable vitamin B12 deficiency-continue supplementation. CKD-renal function stable Anemia of chronic kidney dz-stable. Constipation-well controlled. Allergic rhinitis-denies sx Depression -- on Zoloft Hyperlipidemia-Zocor was started  CPT CODE: 22336  [name]. Kerry Dory, MD [facility] [phone]

## 2013-12-07 ENCOUNTER — Non-Acute Institutional Stay (SKILLED_NURSING_FACILITY): Payer: PRIVATE HEALTH INSURANCE | Admitting: Internal Medicine

## 2013-12-07 DIAGNOSIS — E1159 Type 2 diabetes mellitus with other circulatory complications: Secondary | ICD-10-CM

## 2013-12-07 DIAGNOSIS — I10 Essential (primary) hypertension: Secondary | ICD-10-CM

## 2013-12-07 DIAGNOSIS — E039 Hypothyroidism, unspecified: Secondary | ICD-10-CM

## 2013-12-07 DIAGNOSIS — I5022 Chronic systolic (congestive) heart failure: Secondary | ICD-10-CM

## 2013-12-07 NOTE — Progress Notes (Signed)
PROGRESS NOTE  DATE: 12-07-13   FACILITY: [facility]  LEVEL OF CARE: SNF  Routine Visit  CHIEF COMPLAINT:  Manage CHF, hypothyroidism and hypertension  HISTORY OF PRESENT ILLNESS:  REASSESSMENT OF ONGOING PROBLEMS:  CHF:The patient does not relate significant weight changes, denies sob, DOE, orthopnea, PNDs, palpitations or chest pain.  CHF remains stable.  No complications form the medications being used.  Patient is concerned that his bilateral lower extremity swelling is persistent.   HYPOTHYROIDISM: The hypothyroidism remains stable. No complications noted from the medications presently being used.  The patient denies fatigue or constipation.  Last TSH 0.984 in 11/13, in 10/14 TSH 4.455, in 4-15 TSH 4.472.  HTN: Pt 's HTN remains stable.  Denies CP, sob, DOE, headaches, dizziness or visual disturbances.  No complications from the meds currently being used.  Last BP : 118/69, 120/75, 130/70, 114/66, 118/69, 137/69, 109/55, 102/51, 120/78, 110/72, 111/48, 119/60, 112/63.  PAST MEDICAL HISTORY : Reviewed.  No changes.  CURRENT MEDICATIONS: Reviewed per [facility]  REVIEW OF SYSTEMS:  GENERAL: no change in appetite, no fatigue, no weight changes, no fever, chills or weakness RESPIRATORY: no cough, SOB, DOE,wheezing, hemoptysis CARDIAC: no chest pain, or palpitations.  Bilateral lower extremity swelling present. GI: no abdominal pain, diarrhea, heart burn, nausea or vomiting, complains of constipation Musculoskeletal-complains of posterior neck pain  PHYSICAL EXAMINATION  VS: see VS section   GENERAL: no acute distress, normal body habitus EYES: normal sclerae, normal conjunctivae, no discharge NECK: supple, trachea midline, no neck masses, no thyroid tenderness, no thyromegaly LYMPHATICS: no cervical LAN, no supraclavicular LAN RESPIRATORY: breathing is even & unlabored, BS CTAB CARDIAC: RRR, no murmur,no extra heart sounds, +2 bilateral lower extremity edema GI:  abdomen soft, normal BS, no masses, no tenderness, no hepatomegaly, no splenomegaly PSYCHIATRIC: the patient is alert & oriented to person, affect & behavior appropriate Musculoskeletal-posterior neck tender to palpation, some pain elicited with range of motion testing, no erythema, no warmth or edema  LABS/RADIOLOGY: 9-15 hemoglobin 12, MCV 93.3, platelets 147, WBC 7.1, glucose 228, BUN 34, creatinine 1.7 otherwise BMP normal 7-15 glucose 267, BUN 30, creatinine 1.9 otherwise BMP normal 6-15 hemoglobin 12.5, MCV 92.9, platelets 112, WBC 5.1, hemoglobin A1c 9.1, glucose 263, BUN 38, creatinine 1.7 otherwise BMP normal, HDL 27, triglycerides 289, total cholesterol 154, LDL 69 5-15 glucose 209, BUN 40, creatinine 1.6 otherwise BMP normal, liver profile normal, and digoxin level 1.3 4/15 glc 149, bun 40, cr 1.7 ow bmp nl 3/15 HbA1c 8.1 1/15 Hb 12.4, mcv 93.4, plt 139, wbc 6  12-14 glucose 223, BUN 40, creatinine 1.5 otherwise BMP normal, hemoglobin 12.3, MCV 90.9 otherwise CBC normal, hemoglobin A1c 8.5  10/14 glc 185, bun 29, cr 1.4 ow cmp nl, HbA1c 7.4, Hb 12.7, mcv 94.3, plt 129, wbc 5.4, 2D echo showed 65%, repeat labs: Hemoglobin 11.9, MCV 90.4, platelets 106, WBC 5.6, glucose 236, BUN 41, creatinine 1.97 otherwise BMP normal  4/14 glucose 237, BUN 31, creatinine 1.52 otherwise BMP normal  3/14 BNP 406.3, glucose 256, BUN 44, creatinine 1.63 otherwise BMP normal  2/14 hemoglobin A1c 7.2 11/13 WBC 10.8, hemoglobin 12.3, MCV 89.1, platelets 116, glucose 174, BUN 41, creatinine 1.66 otherwise CMP normal, fasting lipid panel normal  ASSESSMENT/PLAN:  CHF-compensated hypothyroidism-well-controlled.  hypertension-stable diabetes mellitus- uncontrolled. lantus was increased. Anxiety-stable vitamin B12 deficiency-continue supplementation. CKD-renal function stable Anemia of chronic kidney dz-stable. Constipation-well controlled. Allergic rhinitis-denies sx Depression -- on  Zoloft Hyperlipidemia-on Zocor  Acute bronchitis-on augmentin Neck pain-new problem. Check x-ray.  CPT CODE: 08657  Newton Pigg. Kerry Dory, MD [facility] [phone]

## 2014-01-06 ENCOUNTER — Non-Acute Institutional Stay (SKILLED_NURSING_FACILITY): Payer: PRIVATE HEALTH INSURANCE | Admitting: Internal Medicine

## 2014-01-06 DIAGNOSIS — E039 Hypothyroidism, unspecified: Secondary | ICD-10-CM

## 2014-01-06 DIAGNOSIS — E1159 Type 2 diabetes mellitus with other circulatory complications: Secondary | ICD-10-CM

## 2014-01-06 DIAGNOSIS — I10 Essential (primary) hypertension: Secondary | ICD-10-CM

## 2014-01-06 DIAGNOSIS — I5022 Chronic systolic (congestive) heart failure: Secondary | ICD-10-CM

## 2014-01-08 DIAGNOSIS — E119 Type 2 diabetes mellitus without complications: Secondary | ICD-10-CM | POA: Insufficient documentation

## 2014-01-08 NOTE — Progress Notes (Signed)
PROGRESS NOTE  DATE: 01-06-14   FACILITY: [facility]  LEVEL OF CARE: SNF  Routine Visit  CHIEF COMPLAINT:  Manage CHF, hypothyroidism and hypertension  HISTORY OF PRESENT ILLNESS:  REASSESSMENT OF ONGOING PROBLEMS:  CHF:The patient does not relate significant weight changes, denies sob, DOE, orthopnea, PNDs, palpitations or chest pain.  CHF remains stable.  No complications form the medications being used.  Patient is concerned that his bilateral lower extremity swelling is persistent.   HYPOTHYROIDISM: The hypothyroidism remains stable. No complications noted from the medications presently being used.  The patient denies fatigue or constipation.  Last TSH 0.984 in 11/13, in 10/14 TSH 4.455, in 4-15 TSH 4.472.  HTN: Pt 's HTN remains stable.  Denies CP, sob, DOE, headaches, dizziness or visual disturbances.  No complications from the meds currently being used.  Last BP : 118/69, 120/75, 130/70, 114/66, 118/69, 137/69, 109/55, 102/51, 120/78, 110/72, 111/48, 119/60, 112/63, 117/61.  PAST MEDICAL HISTORY : Reviewed.  No changes.  [facility]  REVIEW OF SYSTEMS:  GENERAL: no change in appetite, no fatigue, no weight changes, no fever, chills or weakness RESPIRATORY: no cough, SOB, DOE,wheezing, hemoptysis CARDIAC: no chest pain, or palpitations.  Bilateral lower extremity swelling present. GI: no abdominal pain, diarrhea, heart burn, nausea or vomiting, complains of constipation Musculoskeletal-complains of posterior neck pain  PHYSICAL EXAMINATION  VS: see VS section   GENERAL: no acute distress, normal body habitus EYES: normal sclerae, normal conjunctivae, no discharge NECK: supple, trachea midline, no neck masses, no thyroid tenderness, no thyromegaly LYMPHATICS: no cervical LAN, no supraclavicular LAN RESPIRATORY: breathing is even & unlabored, BS CTAB CARDIAC: RRR, no murmur,no extra heart sounds, +2 right lower extremity edema, +1  left lower extremity edema GI: abdomen soft, normal BS, no masses, no tenderness, no hepatomegaly, no splenomegaly PSYCHIATRIC: the patient is alert & oriented to person, affect & behavior appropriate Musculoskeletal-posterior neck tender to palpation, some pain elicited with range of motion testing, no erythema, no warmth or edema  LABS/RADIOLOGY: 9-15 hemoglobin 12, MCV 93.3, platelets 147, WBC 7.1, glucose 228, BUN 34, creatinine 1.7 otherwise BMP normal 7-15 glucose 267, BUN 30, creatinine 1.9 otherwise BMP normal 6-15 hemoglobin 12.5, MCV 92.9, platelets 112, WBC 5.1, hemoglobin A1c 9.1, glucose 263, BUN 38, creatinine 1.7 otherwise BMP normal, HDL 27, triglycerides 289, total cholesterol 154, LDL 69 5-15 glucose 209, BUN 40, creatinine 1.6 otherwise BMP normal, liver profile normal, and digoxin level 1.3 4/15 glc 149, bun 40, cr 1.7 ow bmp nl 3/15 HbA1c 8.1 1/15 Hb 12.4, mcv 93.4, plt 139, wbc 6  12-14 glucose 223, BUN 40, creatinine 1.5 otherwise BMP normal, hemoglobin 12.3, MCV 90.9 otherwise CBC normal, hemoglobin A1c 8.5  10/14 glc 185, bun 29, cr 1.4 ow cmp nl, HbA1c 7.4, Hb 12.7, mcv 94.3, plt 129, wbc 5.4, 2D echo showed 65%, repeat labs: Hemoglobin 11.9, MCV 90.4, platelets 106, WBC 5.6, glucose 236, BUN 41, creatinine 1.97 otherwise BMP normal  4/14 glucose 237, BUN 31, creatinine 1.52 otherwise BMP normal  3/14 BNP 406.3, glucose 256, BUN 44, creatinine 1.63 otherwise BMP normal  2/14 hemoglobin A1c 7.2 11/13 WBC 10.8, hemoglobin 12.3, MCV 89.1, platelets 116, glucose 174, BUN 41, creatinine 1.66 otherwise CMP normal, fasting lipid panel normal  ASSESSMENT/PLAN:  CHF-compensated hypothyroidism-well-controlled. Check TSH. hypertension-stable diabetes mellitus- uncontrolled. lantus was increased. Check hemoglobin A1c Anxiety-stable vitamin B12 deficiency-continue supplementation. CKD-renal function stable Anemia of chronic kidney dz-stable. Constipation-well  controlled. Allergic rhinitis-denies sx Depression -- on Zoloft Hyperlipidemia-on Zocor  CPT CODE: 16073  [name]. Kerry Dory, MD [facility] [phone]

## 2014-01-27 ENCOUNTER — Other Ambulatory Visit: Payer: Self-pay | Admitting: *Deleted

## 2014-01-27 MED ORDER — ALPRAZOLAM 0.5 MG PO TABS: ORAL_TABLET | ORAL | Status: DC

## 2014-01-27 NOTE — Telephone Encounter (Signed)
[facility] 

## 2014-02-16 ENCOUNTER — Encounter: Payer: Self-pay | Admitting: *Deleted

## 2014-03-03 ENCOUNTER — Encounter (HOSPITAL_COMMUNITY): Payer: Self-pay | Admitting: Internal Medicine

## 2014-03-22 ENCOUNTER — Encounter: Payer: Self-pay | Admitting: *Deleted

## 2014-04-20 ENCOUNTER — Non-Acute Institutional Stay (SKILLED_NURSING_FACILITY): Payer: Medicare Other | Admitting: Internal Medicine

## 2014-04-20 DIAGNOSIS — N183 Chronic kidney disease, stage 3 unspecified: Secondary | ICD-10-CM

## 2014-04-20 DIAGNOSIS — J309 Allergic rhinitis, unspecified: Secondary | ICD-10-CM

## 2014-04-20 DIAGNOSIS — K5901 Slow transit constipation: Secondary | ICD-10-CM

## 2014-04-20 DIAGNOSIS — E785 Hyperlipidemia, unspecified: Secondary | ICD-10-CM

## 2014-04-20 DIAGNOSIS — F329 Major depressive disorder, single episode, unspecified: Secondary | ICD-10-CM | POA: Diagnosis not present

## 2014-04-20 DIAGNOSIS — E1122 Type 2 diabetes mellitus with diabetic chronic kidney disease: Secondary | ICD-10-CM

## 2014-04-20 DIAGNOSIS — I5022 Chronic systolic (congestive) heart failure: Secondary | ICD-10-CM

## 2014-04-20 DIAGNOSIS — E538 Deficiency of other specified B group vitamins: Secondary | ICD-10-CM

## 2014-04-20 DIAGNOSIS — E038 Other specified hypothyroidism: Secondary | ICD-10-CM

## 2014-04-20 DIAGNOSIS — K219 Gastro-esophageal reflux disease without esophagitis: Secondary | ICD-10-CM

## 2014-04-20 DIAGNOSIS — F32A Depression, unspecified: Secondary | ICD-10-CM

## 2014-04-20 NOTE — Progress Notes (Signed)
Patient ID: [name], male   DOB: 1919/09/13, 81 y.o.   MRN: [id number]    [facility]  Chief complaint: medical management of chronic issues  Allergies: ativan, codeine, promethazine  Code status: full code  HPI 81 y/o male pt is seen for routine visit. He has dementia and DM among others. Staff have noticed increased leg swelling. He is non compliant with his diet. He is watching television and denies any concerns. He has been tolerating his synthroid well. He continues to get b12 injection monthly. His zocor was recently increased for his cholesterol  Review of Systems  Constitutional: Negative for fever, chills, diaphoresis.  HENT: Negative for congestion Eyes: Negative for blurred vision, double vision and discharge.  Respiratory: Negative for cough, sputum production, shortness of breath and wheezing.   Cardiovascular: Negative for chest pain, palpitations, leg swelling.  Gastrointestinal: Negative for heartburn, nausea, vomiting, abdominal pain  Psychiatric/Behavioral: Negative for depression   Past medical history reviewed  Medication reviewed. See [facility]  Physical exam BP 120/60 mmHg  Pulse 72  Temp(Src) 97.9 F (36.6 C)  Resp 18  SpO2 92%  General- elderly male in no acute distress Head- atraumatic, normocephalic Eyes- PERRLA, EOMI, no pallor, no icterus Neck- no lymphadenopathy, no thyromegaly, no jugular vein distension Cardiovascular- irregular heart rate, no murmurs, 1+ leg edema Respiratory- bilateral clear to auscultation, no wheeze, no rhonchi, no crackles Abdomen- bowel sounds present, soft, non tender Musculoskeletal- able to move all 4 extremities, slef propels on wheelchair, can transfer    Psychiatry- alert and oriented to person, pleasantly confused  Labs  02/22/14 t.chol 132, HDL 25, LDL 64, TG 218, tsh 3.64   Assessment/plan  Hyperlipidemia With rise in TG level his zocor has been icnreased to  20 mg daily, continue this, tolerating well  Depression Mood stable continue zoloft 50 mg daily, xanax 0.5 mg qhshronic   Hypothyroidism Stable tsh, continue levothyroxine 100 mcg daily  chf Increased leg edema. Add ted stocking. Dietary compliance suggested. Continue lasix 40 mg daily in am and 20 mg in pm with metolazone 2.5 mg daily prn for weight > 200 lbs. Monitor weight  Allergic rhinitis Stable, continue claritin 10 mg daily  gerd Stable, continue protonix 40 mg daily  Constipation  stable on enulose daily and colace  Dm type 2 Stable, monitor cbg, continue lantus 28 u bid with lispro 5 u with meals. Recheck next routine a1c and adjust dosing if needed.   b12 deficiency Continue monthly b12 injection

## 2014-04-22 ENCOUNTER — Encounter: Payer: Self-pay | Admitting: *Deleted

## 2014-05-04 LAB — HEPATIC FUNCTION PANEL
ALT: 13 U/L (ref 10–40)
AST: 14 U/L (ref 14–40)
Alkaline Phosphatase: 53 U/L (ref 25–125)
Bilirubin, Total: 0.6 mg/dL

## 2014-05-04 LAB — BASIC METABOLIC PANEL
BUN: 39 mg/dL — AB (ref 4–21)
Creatinine: 1.6 mg/dL — AB (ref <=1.3)
GLUCOSE: 301 mg/dL
POTASSIUM: 4.2 mmol/L (ref 3.4–5.3)
SODIUM: 138 mmol/L (ref 137–147)

## 2014-05-04 LAB — CBC AND DIFFERENTIAL
HCT: 35 % — AB (ref 41–53)
HEMOGLOBIN: 11.4 g/dL — AB (ref 13.5–17.5)
Platelets: 113 10*3/uL — AB (ref 150–399)
WBC: 5.7 10^3/mL

## 2014-05-04 LAB — HEMOGLOBIN A1C: Hgb A1c MFr Bld: 8.6 % — AB (ref 4.0–6.0)

## 2014-05-19 ENCOUNTER — Encounter: Payer: Self-pay | Admitting: *Deleted

## 2014-05-20 LAB — LIPID PANEL
Cholesterol: 104 mg/dL (ref 0–200)
HDL: 28 mg/dL — AB (ref 35–70)
LDL CALC: 41 mg/dL
Triglycerides: 173 mg/dL — AB (ref 40–160)

## 2014-05-26 ENCOUNTER — Other Ambulatory Visit: Payer: Self-pay | Admitting: *Deleted

## 2014-05-26 MED ORDER — TEMAZEPAM 7.5 MG PO CAPS: ORAL_CAPSULE | ORAL | Status: DC

## 2014-05-26 NOTE — Telephone Encounter (Signed)
[facility] # [phone] Fax: [phone] 

## 2014-06-21 ENCOUNTER — Non-Acute Institutional Stay (SKILLED_NURSING_FACILITY): Payer: Medicare Other | Admitting: Internal Medicine

## 2014-06-21 DIAGNOSIS — E1122 Type 2 diabetes mellitus with diabetic chronic kidney disease: Secondary | ICD-10-CM

## 2014-06-21 DIAGNOSIS — N183 Chronic kidney disease, stage 3 unspecified: Secondary | ICD-10-CM

## 2014-06-21 DIAGNOSIS — I482 Chronic atrial fibrillation, unspecified: Secondary | ICD-10-CM

## 2014-06-23 ENCOUNTER — Encounter: Payer: Self-pay | Admitting: *Deleted

## 2014-06-23 DIAGNOSIS — N183 Chronic kidney disease, stage 3 unspecified: Secondary | ICD-10-CM | POA: Insufficient documentation

## 2014-06-23 DIAGNOSIS — I482 Chronic atrial fibrillation, unspecified: Secondary | ICD-10-CM | POA: Insufficient documentation

## 2014-06-23 DIAGNOSIS — E1122 Type 2 diabetes mellitus with diabetic chronic kidney disease: Secondary | ICD-10-CM | POA: Insufficient documentation

## 2014-06-23 NOTE — Progress Notes (Signed)
Patient ID: [name], male   DOB: 12/20/1919, 81 y.o.   MRN: [id number]    [facility]  Chief complaint: medical management of chronic issues  Allergies: ativan, codeine, promethazine  Code status: full code  HPI 81 y/o male pt is here for long term care. He is seen for routine visit. His daughter is present at bedside. He has dementia. He has pmh of chf, ckd, HTN, DM, afib. His chf has been stable. He is taking his lantus. He denies any concerns. Non compliant with his diet  Review of Systems  Constitutional: Negative for fever, chills, diaphoresis.  HENT: Negative for congestion Eyes: Negative for blurred vision, double vision and discharge.  Respiratory: Negative for cough, sputum production, shortness of breath and wheezing.   Cardiovascular: Negative for chest pain, palpitations, leg swelling.  Gastrointestinal: Negative for heartburn, nausea, vomiting, abdominal pain  Genitourinary: Negative for dysuria  Musculoskeletal: Negative for back pain, falls  Skin: Negative for itching and rash.  Neurological: Negative for dizziness, tingling, headaches.  Psychiatric/Behavioral: Negative for depression  Past medical history reviewed    Medication List       This list is accurate as of: 06/21/14 11:59 PM.  Always use your most recent med list.               acetaminophen 325 MG tablet  Commonly known as:  TYLENOL  Take 650 mg by mouth 2 (two) times daily. For osteoarthritic pain     ALPRAZolam 0.5 MG tablet  Commonly known as:  XANAX  Take one tablet by mouth every night at bedtime for anxiety/insomnia; Take one tablet by mouth once daily as needed for anxiety     carvedilol 3.125 MG tablet  Commonly known as:  COREG  Take 3.125 mg by mouth 2 (two) times daily with a meal. For HTN     cholecalciferol 1000 UNITS tablet  Commonly known as:  VITAMIN D  Take 1,000 Units by mouth daily.     clopidogrel 75 MG tablet    Commonly known as:  PLAVIX  Take 75 mg by mouth daily.     digoxin 0.125 MG tablet  Commonly known as:  LANOXIN  Take 0.125 mg by mouth daily.     docusate sodium 100 MG capsule  Commonly known as:  COLACE  Take 100 mg by mouth 2 (two) times daily. For constipation     ELIQUIS 2.5 MG Tabs tablet  Generic drug:  apixaban  Take 2.5 mg by mouth 2 (two) times daily.     furosemide 20 MG tablet  Commonly known as:  LASIX  Take 20 mg by mouth. 40 mg am and 20 mg pm     insulin glargine 100 UNIT/ML injection  Commonly known as:  LANTUS  Inject 35 Units into the skin 2 (two) times daily.     insulin lispro 100 UNIT/ML injection  Commonly known as:  HUMALOG  Inject 5 Units into the skin 3 (three) times daily before meals.     levothyroxine 100 MCG tablet  Commonly known as:  SYNTHROID, LEVOTHROID  Take 100 mcg by mouth daily.     loratadine 10 MG tablet  Commonly known as:  CLARITIN  Take 10 mg by mouth daily. For rhinitis     metolazone 2.5 MG tablet  Commonly known as:  ZAROXOLYN  Take 2.5 mg by mouth daily.     pantoprazole 40 MG tablet  Commonly known as:  PROTONIX  Take 40 mg by mouth daily.     sertraline 25 MG tablet  Commonly known as:  ZOLOFT  Take 75 mg by mouth daily. For depression     simvastatin 20 MG tablet  Commonly known as:  ZOCOR  Take 20 mg by mouth daily.     temazepam 7.5 MG capsule  Commonly known as:  RESTORIL  Take one capsule by mouth every night at bedtime for insomnia     VITAMIN B-12 IJ  Inject 1,000 mcg as directed every 30 (thirty) days. On the 26th of every month       Physical exam BP 123/60 mmHg  Pulse 71  Temp(Src) 97.4 F (36.3 C)  Resp 16  SpO2 97%  General- elderly male in no acute distress Head- atraumatic, normocephalic Eyes- PERRLA, EOMI, no pallor, no icterus Neck- no lymphadenopathy, no thyromegaly, no jugular vein distension Chest- no chest wall deformities, no chest wall tenderness Cardiovascular- irregular  heart rate, no murmurs, chronic trace leg edema Respiratory- bilateral clear to auscultation, no wheeze, no rhonchi, no crackles Abdomen- bowel sounds present, soft, non tender Musculoskeletal- able to move all 4 extremities, slef propels on wheelchair, can transfer   Neurological- no focal deficit Psychiatry- alert and oriented to person, pleasantly confused  Labs 05/04/14 wbc 5.7, hb 11.4, plt 113, ca 8.8, na 138, k 4.2, bun 39, cr 1.58, glu 301, a1c 8.6  Assessment/plan  Chronic afib Rate controlled, continue coreg and digoxin with eliquis, has a pacemaker  ckd stage 3 Monitor clinically, continue bp and diabetesmedications  Dm type 2 with renal disease Uncontrolled, continue lantus 35 u bid and SSI lispro with meals, monitor a1c.continue zocor

## 2014-07-15 ENCOUNTER — Non-Acute Institutional Stay (SKILLED_NURSING_FACILITY): Payer: Medicare Other | Admitting: Internal Medicine

## 2014-07-15 DIAGNOSIS — E039 Hypothyroidism, unspecified: Secondary | ICD-10-CM

## 2014-07-15 DIAGNOSIS — E785 Hyperlipidemia, unspecified: Secondary | ICD-10-CM

## 2014-07-15 DIAGNOSIS — G4700 Insomnia, unspecified: Secondary | ICD-10-CM

## 2014-07-15 DIAGNOSIS — K219 Gastro-esophageal reflux disease without esophagitis: Secondary | ICD-10-CM | POA: Diagnosis not present

## 2014-07-15 NOTE — Progress Notes (Signed)
Patient ID: [name], male   DOB: [birth year], 81 y.o.   MRN: [id number]    [facility]  Chief complaint: medical management of chronic issues  Allergies: ativan, codeine, promethazine  Code status: full code  HPI 81 y/o male pt is seen for routine visit. He has dementia which limits his history and ROS. He has pmh of chf, ckd, HTN, DM, afib. He denies any concerns. No falls reported. No skin concerns. No acute behavioral concerns.  Review of Systems  Constitutional: Negative for fever, chills, diaphoresis.  HENT: Negative for congestion.  Respiratory: Negative for cough, sputum production, shortness of breath and wheezing.   Cardiovascular: Negative for chest pain, palpitations, leg swelling.  Gastrointestinal: Negative for heartburn, nausea, vomiting, abdominal pain  Genitourinary: Negative for dysuria  Neurological: Negative for dizziness Psychiatric/Behavioral: Negative for depression   Past medical history reviewed      Medication List       This list is accurate as of: 07/15/14  6:09 PM.  Always use your most recent med list.               acetaminophen 325 MG tablet  Commonly known as:  TYLENOL  Take 650 mg by mouth 2 (two) times daily. For osteoarthritic pain     ALPRAZolam 0.5 MG tablet  Commonly known as:  XANAX  Take one tablet by mouth every night at bedtime for anxiety/insomnia; Take one tablet by mouth once daily as needed for anxiety     carvedilol 3.125 MG tablet  Commonly known as:  COREG  Take 3.125 mg by mouth 2 (two) times daily with a meal. For HTN     CERTA-VITE PO  Take by mouth. Take 1 tablet by mouth daily     cholecalciferol 1000 UNITS tablet  Commonly known as:  VITAMIN D  Take 1,000 Units by mouth daily.     clopidogrel 75 MG tablet  Commonly known as:  PLAVIX  Take 75 mg by mouth daily.     digoxin 0.125 MG tablet  Commonly known as:  LANOXIN  Take 0.125 mg by mouth daily.     docusate sodium 100 MG capsule  Commonly known as:  COLACE  Take 100 mg by mouth 2 (two) times daily. For constipation     ELIQUIS 2.5 MG Tabs tablet  Generic drug:  apixaban  Take 2.5 mg by mouth 2 (two) times daily.     furosemide 20 MG tablet  Commonly known as:  LASIX  Take 20 mg by mouth. 40 mg am and 20 mg pm     insulin glargine 100 UNIT/ML injection  Commonly known as:  LANTUS  Inject 35 Units into the skin 2 (two) times daily.     insulin lispro 100 UNIT/ML injection  Commonly known as:  HUMALOG  Inject 5 Units into the skin 3 (three) times daily before meals.     lactulose (encephalopathy) 10 GM/15ML Soln  Commonly known as:  CHRONULAC  Take by mouth. Take 15 ml by mouth daily for constipation     levothyroxine 100 MCG tablet  Commonly known as:  SYNTHROID, LEVOTHROID  Take 100 mcg by mouth daily.     loratadine 10 MG tablet  Commonly known as:  CLARITIN  Take 10 mg by mouth daily. For rhinitis     Menthol (Topical Analgesic) 4 % Gel  Apply topically. Apply to posterior neck twice daily for chronic pain     metolazone  2.5 MG tablet  Commonly known as:  ZAROXOLYN  Take 2.5 mg by mouth daily. Take 1 tablet by mouth every morning for weight     pantoprazole 40 MG tablet  Commonly known as:  PROTONIX  Take 40 mg by mouth daily.     PROCEL Powd  Take by mouth. Administer 1 scoop by mouth twice daily for supplement     sertraline 25 MG tablet  Commonly known as:  ZOLOFT  Take 75 mg by mouth daily. For depression     simvastatin 20 MG tablet  Commonly known as:  ZOCOR  Take 20 mg by mouth. Take 1 tablet by mouth every evening for Hyperlipidemia     temazepam 7.5 MG capsule  Commonly known as:  RESTORIL  Take one capsule by mouth every night at bedtime for insomnia     VITAMIN B-12 IJ  Inject 1,000 mcg as directed every 30 (thirty) days. On the 26th of every month        Physical exam BP 110/62 mmHg  Pulse 72  Temp(Src) 98.4 F (36.9 C)  Resp 18   Wt 195 lb (88.451 kg)  SpO2 94%  General- elderly male in no acute distress Head- atraumatic, normocephalic Eyes- PERRLA, EOMI, no pallor, no icterus Neck- no lymphadenopathy, no thyromegaly, no jugular vein distension Chest- no chest wall deformities, no chest wall tenderness Cardiovascular- irregular heart rate, no murmurs, chronic trace leg edema Respiratory- bilateral clear to auscultation, no wheeze, no rhonchi, no crackles Abdomen- bowel sounds present, soft, non tender Musculoskeletal- able to move all 4 extremities, slef propels on wheelchair, can transfer    Neurological- no focal deficit Psychiatry- alert and oriented to person, pleasantly confused  Labs 06/08/14 pth 55, ca 8.8 05/20/14 t.chol 104, tg 173, ldl 41, hdl 28, vitamin d 34 05/04/14 wbc 5.7, hb 11.4, plt 113, ca 8.8, na 138, k 4.2, bun 39, cr 1.58, glu 301, a1c 8.6  Assessment/plan  gerd Stable, on protonix 40 mg daily, change to 20 mg daily and reassess  Hyperlipidemia Continue zocor 20 mg daily  Insomnia Continue restoril 7.5 mg daily  Hypothyroidism Last tsh 3.219 in 05/04/14. Continue synthroid 100 mcg daily

## 2014-07-20 ENCOUNTER — Encounter: Payer: Self-pay | Admitting: *Deleted

## 2014-07-25 ENCOUNTER — Telehealth: Payer: Self-pay | Admitting: Internal Medicine

## 2014-07-25 NOTE — Telephone Encounter (Signed)
Pt has lost weight (per daughter, due to laxative). Pt has lost approx 5lbs. Pt has fallen twice. Device pocket is not bruised. Daughter, Darl Pikes, requesting Belenda Cruise come check in-person at [facility].  ROV w/ Amber 08/10/14.

## 2014-07-25 NOTE — Telephone Encounter (Signed)
New message      Pt is at [facility].  No one there has checked his pacemaker.  Daughter said pacemaker is buldging out of his chest and she can see the wires.  She want someone to go over and check it

## 2014-08-10 ENCOUNTER — Encounter: Payer: Medicaid Other | Admitting: Nurse Practitioner

## 2014-08-10 ENCOUNTER — Encounter: Payer: Self-pay | Admitting: Nurse Practitioner

## 2014-08-10 NOTE — Progress Notes (Signed)
This encounter was created in error - please disregard.

## 2014-08-11 ENCOUNTER — Non-Acute Institutional Stay (SKILLED_NURSING_FACILITY): Payer: Medicare Other | Admitting: Internal Medicine

## 2014-08-11 DIAGNOSIS — I5022 Chronic systolic (congestive) heart failure: Secondary | ICD-10-CM

## 2014-08-11 DIAGNOSIS — J309 Allergic rhinitis, unspecified: Secondary | ICD-10-CM | POA: Insufficient documentation

## 2014-08-11 DIAGNOSIS — R600 Localized edema: Secondary | ICD-10-CM | POA: Diagnosis not present

## 2014-08-11 DIAGNOSIS — L03032 Cellulitis of left toe: Secondary | ICD-10-CM

## 2014-08-11 NOTE — Progress Notes (Signed)
Patient ID: [name], male   DOB: 27-Jun-1919, 81 y.o.   MRN: [id number]     [facility]  Chief complaint: medical management of chronic issues  Allergies: ativan, codeine, promethazine  Code status: full code  HPI 81 y/o male pt is seen for routine visit. He has dementia which limits his history and ROS. He has developed redness and  Drainage in his right 2nd toe and is being treated for cellulitis. No fever or chills reported. He has pmh of chf, ckd, HTN, DM, afib. He denies any pther concerns. No falls reported.   Review of Systems  Constitutional: Negative for fever, chills HENT: Negative for congestion.  Respiratory: Negative for cough, shortness of breath and wheezing.   Cardiovascular: Negative for chest pain, palpitations, leg swelling.  Gastrointestinal: Negative for heartburn, nausea, vomiting, abdominal pain  Genitourinary: Negative for dysuria  Neurological: Negative for dizziness Psychiatric/Behavioral: Negative for depression   Past medical history reviewed      Medication List       This list is accurate as of: 08/11/14  7:13 PM.  Always use your most recent med list.               acetaminophen 325 MG tablet  Commonly known as:  TYLENOL  Take 650 mg by mouth 2 (two) times daily. For osteoarthritic pain     ALPRAZolam 0.5 MG tablet  Commonly known as:  XANAX  Take one tablet by mouth every night at bedtime for anxiety/insomnia; Take one tablet by mouth once daily as needed for anxiety     carvedilol 3.125 MG tablet  Commonly known as:  COREG  Take 3.125 mg by mouth 2 (two) times daily with a meal. For HTN     CERTA-VITE PO  Take by mouth. Take 1 tablet by mouth daily     cholecalciferol 1000 UNITS tablet  Commonly known as:  VITAMIN D  Take 1,000 Units by mouth daily.     clopidogrel 75 MG tablet  Commonly known as:  PLAVIX  Take 75 mg by mouth daily.     digoxin 0.125 MG tablet  Commonly  known as:  LANOXIN  Take 0.125 mg by mouth daily.     docusate sodium 100 MG capsule  Commonly known as:  COLACE  Take 100 mg by mouth 2 (two) times daily. For constipation     ELIQUIS 2.5 MG Tabs tablet  Generic drug:  apixaban  Take 2.5 mg by mouth 2 (two) times daily.     furosemide 20 MG tablet  Commonly known as:  LASIX  Take 20 mg by mouth. 40 mg am and 20 mg pm     insulin glargine 100 UNIT/ML injection  Commonly known as:  LANTUS  Inject 35 Units into the skin 2 (two) times daily.     insulin lispro 100 UNIT/ML injection  Commonly known as:  HUMALOG  Inject 5 Units into the skin 3 (three) times daily before meals.     lactulose (encephalopathy) 10 GM/15ML Soln  Commonly known as:  CHRONULAC  Take by mouth. Take 15 ml by mouth daily for constipation     levothyroxine 100 MCG tablet  Commonly known as:  SYNTHROID, LEVOTHROID  Take 100 mcg by mouth daily.     loratadine 10 MG tablet  Commonly known as:  CLARITIN  Take 10 mg by mouth daily. For rhinitis     Menthol (Topical Analgesic) 4 % Gel  Apply topically. Apply to posterior neck twice daily for chronic pain     metolazone 2.5 MG tablet  Commonly known as:  ZAROXOLYN  Take 2.5 mg by mouth daily. Take 1 tablet by mouth every morning for weight     pantoprazole 40 MG tablet  Commonly known as:  PROTONIX  Take 20 mg by mouth daily.     PROCEL Powd  Take by mouth. Administer 1 scoop by mouth twice daily for supplement     sertraline 25 MG tablet  Commonly known as:  ZOLOFT  Take 75 mg by mouth daily. For depression     simvastatin 20 MG tablet  Commonly known as:  ZOCOR  Take 20 mg by mouth. Take 1 tablet by mouth every evening for Hyperlipidemia     temazepam 7.5 MG capsule  Commonly known as:  RESTORIL  Take one capsule by mouth every night at bedtime for insomnia     VITAMIN B-12 IJ  Inject 1,000 mcg as directed every 30 (thirty) days. On the 26th of every month        Physical exam BP 109/60  mmHg  Pulse 76  Temp(Src) 97.9 F (36.6 C)  Resp 19  Wt 199 lb (90.266 kg)  SpO2 98%  Wt Readings from Last 3 Encounters:  08/11/14 199 lb (90.266 kg)  07/15/14 195 lb (88.451 kg)  04/20/13 204 lb 8 oz (92.761 kg)   General- elderly male in no acute distress Head- atraumatic, normocephalic Eyes- PERRLA, EOMI, no pallor, no icterus Neck- no lymphadenopathy, no thyromegaly, no jugular vein distension Chest- no chest wall deformities, no chest wall tenderness Cardiovascular- irregular heart rate, no murmurs Respiratory- bilateral clear to auscultation, no wheeze, no rhonchi, no crackles Abdomen- bowel sounds present, soft, non tender Musculoskeletal- able to move all 4 extremities, self propels on wheelchair, can transfer, has swelling with erythema and tenderness in right 2nd toe, 2+ pitting edema, feeble dorsalis pedis, normal capillary refill in foot Neurological- no focal deficit Psychiatry- alert and oriented to person, pleasantly confused  Labs 06/08/14 pth 55, ca 8.8 05/20/14 t.chol 104, tg 173, ldl 41, hdl 28, vitamin d 34 05/04/14 wbc 5.7, hb 11.4, plt 113, ca 8.8, na 138, k 4.2, bun 39, cr 1.58, glu 301, a1c 8.6  Assessment/plan  Left toe cellulitis Continue and complete 1 week course of keflex 500 mg bid and antibiotic ointment with dressing. as per staff it has improved clinically. Monitor cbg, continue diabetes medications.  Foot edema Increase lasix to 40 mg bid x 5 days and then continue lasix 40 mg daily. Check bmp. Add kcl 10 meq daily x 5 days  Allergic rhinitis Stable, change claritin to 10 mg qd prn  Chronic systolic heart failure Gained 4 lbs in a month, increase leg edema. Increase lasix to 40 mg bid x 5 days, then 40 mg daily. Continue digoxin 0.125 mg daily, coreg 3.125 mg bid. Monitor weight weekly   Oneal Grout, MD  [facility] [phone] (Monday-Friday 8 am - 5 pm) [phone] (afterhours)

## 2014-08-26 ENCOUNTER — Encounter: Payer: Self-pay | Admitting: Internal Medicine

## 2014-09-07 ENCOUNTER — Non-Acute Institutional Stay (SKILLED_NURSING_FACILITY): Payer: Medicare Other | Admitting: Internal Medicine

## 2014-09-07 ENCOUNTER — Encounter: Payer: Self-pay | Admitting: Internal Medicine

## 2014-09-07 DIAGNOSIS — N183 Chronic kidney disease, stage 3 (moderate): Secondary | ICD-10-CM | POA: Diagnosis not present

## 2014-09-07 DIAGNOSIS — E1165 Type 2 diabetes mellitus with hyperglycemia: Secondary | ICD-10-CM

## 2014-09-07 DIAGNOSIS — D696 Thrombocytopenia, unspecified: Secondary | ICD-10-CM | POA: Diagnosis not present

## 2014-09-07 DIAGNOSIS — I5022 Chronic systolic (congestive) heart failure: Secondary | ICD-10-CM

## 2014-09-07 DIAGNOSIS — M159 Polyosteoarthritis, unspecified: Secondary | ICD-10-CM

## 2014-09-07 DIAGNOSIS — IMO0002 Reserved for concepts with insufficient information to code with codable children: Secondary | ICD-10-CM

## 2014-09-07 DIAGNOSIS — E1129 Type 2 diabetes mellitus with other diabetic kidney complication: Secondary | ICD-10-CM

## 2014-09-07 DIAGNOSIS — E1122 Type 2 diabetes mellitus with diabetic chronic kidney disease: Secondary | ICD-10-CM | POA: Diagnosis not present

## 2014-09-07 DIAGNOSIS — E785 Hyperlipidemia, unspecified: Secondary | ICD-10-CM

## 2014-09-07 NOTE — Progress Notes (Signed)
Patient ID: [name], male   DOB: Aug 01, 1919, 81 y.o.   MRN: [id number]    [facility]  Code Status: Full Code  Chief Complaint  Patient presents with  . Medical Management of Chronic Issues    Routine Visit    Allergies: ativan, codeine, promethazine  HPI 81 y/o male pt is seen for routine visit. He remains non compliant with his diet. He has soda bottle and chips packet in his room. He has completed course of keflex for cellulitis in his legs, doppler was done to rule out DVT. He has dementia which limits his history and ROS. He complaints of soreness in his lower extremity. He denies any other concerns. On review of chart has gained 15 lbs over 2 months. No falls reported. His cbg has been running high and his lantus was increased yesterday. No behavioral issues. No pressure sores reported. He has pmh of chf, ckd, HTN, DM, afib.   Review of Systems  Constitutional: Negative for fever, chills HENT: Negative for congestion.  Respiratory: Negative for cough, shortness of breath and wheezing.   Cardiovascular: Negative for chest pain, palpitations  Gastrointestinal: Negative for heartburn, nausea, vomiting, abdominal pain  Genitourinary: Negative for dysuria  Neurological: Negative for dizziness  Past medical history reviewed      Medication List       This list is accurate as of: 09/07/14  1:41 PM.  Always use your most recent med list.               acetaminophen 325 MG tablet  Commonly known as:  TYLENOL  Take 650 mg by mouth 2 (two) times daily. For osteoarthritic pain     ALPRAZolam 0.5 MG tablet  Commonly known as:  XANAX  Take one tablet by mouth every night at bedtime for anxiety/insomnia; Take one tablet by mouth once daily as needed for anxiety     AMBULATORY NON FORMULARY MEDICATION  Medication Name: Med Pass 240 mL of fluid by mouth three times daily     carvedilol 3.125 MG tablet  Commonly known as:  COREG  Take 3.125 mg by mouth 2  (two) times daily with a meal. For HTN     CERTA-VITE PO  Take by mouth. Take 1 tablet by mouth daily     cholecalciferol 1000 UNITS tablet  Commonly known as:  VITAMIN D  Take 1,000 Units by mouth daily.     clopidogrel 75 MG tablet  Commonly known as:  PLAVIX  Take 75 mg by mouth daily.     digoxin 0.125 MG tablet  Commonly known as:  LANOXIN  Take 0.125 mg by mouth daily.     docusate sodium 100 MG capsule  Commonly known as:  COLACE  Take 100 mg by mouth 2 (two) times daily. For constipation     ELIQUIS 2.5 MG Tabs tablet  Generic drug:  apixaban  Take 2.5 mg by mouth 2 (two) times daily.     furosemide 20 MG tablet  Commonly known as:  LASIX  40 mg am and 20 mg pm     insulin glargine 100 UNIT/ML injection  Commonly known as:  LANTUS  40 units in the am, 35 units in the pm     insulin lispro 100 UNIT/ML injection  Commonly known as:  HUMALOG  5 units subcutaneously three times daily with meals for BS > 200     lactulose (encephalopathy) 10 GM/15ML Soln  Commonly known as:  CHRONULAC  Take by mouth. Take 15 ml by mouth daily for constipation     levothyroxine 100 MCG tablet  Commonly known as:  SYNTHROID, LEVOTHROID  Take 100 mcg by mouth daily.     loratadine 10 MG tablet  Commonly known as:  CLARITIN  Take 10 mg by mouth daily. For rhinitis     Menthol (Topical Analgesic) 4 % Gel  Apply topically. Apply to posterior neck twice daily for chronic pain     metolazone 2.5 MG tablet  Commonly known as:  ZAROXOLYN  Take 2.5 mg by mouth daily. Take 1 tablet by mouth every morning for weight     pantoprazole 40 MG tablet  Commonly known as:  PROTONIX  Take 20 mg by mouth daily.     PROCEL Powd  Take by mouth. Administer 1 scoop by mouth twice daily for supplement     sertraline 25 MG tablet  Commonly known as:  ZOLOFT  Take 75 mg by mouth daily. For depression     simvastatin 20 MG tablet  Commonly known as:  ZOCOR  Take 20 mg by mouth. Take 1 tablet  by mouth every evening for Hyperlipidemia     temazepam 7.5 MG capsule  Commonly known as:  RESTORIL  Take one capsule by mouth every night at bedtime for insomnia     VITAMIN B-12 IJ  Inject 1,000 mcg as directed every 30 (thirty) days. On the 26th of every month        Physical exam BP 130/72 mmHg  Pulse 80  Temp(Src) 97.6 F (36.4 C) (Oral)  Resp 21  Wt 210 lb (95.255 kg)  SpO2 96%  Body mass index is 28.47 kg/(m^2).   Wt Readings from Last 3 Encounters:  09/07/14 210 lb (95.255 kg)  08/11/14 199 lb (90.266 kg)  07/15/14 195 lb (88.451 kg)   General- elderly male, overweight, in no distress Head- atraumatic, normocephalic Eyes- PERRLA, EOMI, no pallor, no icterus Neck- no lymphadenopathy, no thyromegaly, no jugular vein distension Chest- no chest wall deformities, no chest wall tenderness Cardiovascular- irregular heart rate, no murmurs, pitting 2+ edema in both legs Respiratory- bilateral basilar crackles present, no wheeze, no rhonchi Abdomen- bowel sounds present, soft, non tender Musculoskeletal- able to move all 4 extremities, self propels on wheelchair, can transfer, UE strength 5/5 and LE 4/5. feeble dorsalis pedis, normal capillary refill in foot Neurological- no focal deficit Skin- erythema in both lower extremity, bandaid in right leg, no weeping lesion Psychiatry- alert and oriented to person, pleasantly confused  Labs 06/08/14 pth 55, ca 8.8 05/20/14 t.chol 104, tg 173, ldl 41, hdl 28, vitamin d 34 05/04/14 wbc 5.7, hb 11.4, plt 113, ca 8.8, na 138, k 4.2, bun 39, cr 1.58, glu 301, a1c 8.6, tsh 3.219, digoxin 1.5  Assessment/plan  Chronic systolic heart failure Gained 15 lbs in 2 month period. Has basilar crackles and 2+ leg edema. On , increase leg edema. on lasix 40 mg in am and 20 mg in pm with metolazone 2.5 mg daily. Non compliant with diet. Change lasix to 40 mg bid for now. Might need to consider torsemide if renal function worsens. Dietary  compliance reinforced. Notified staff to let daughter know not to bring soda and chips. Need NAS diet and fluid restriction to prevent acute exacerbation. Continue digoxin 0.125 mg daily, coreg 3.125 mg bid. Monitor daily weight for now. Not on ACEI with impaired renal function. cmp check 1 week  Dm type 2 with hyperglycemia  cbg 200-300 range. Monitor cbg, lantus increased yesterday to 40 u in am and 35 u in pm. Check a1c. On humalog 5 u with meals for cbg > 200. Change humalog to 5 u for cbg 150-250, 8 u for 251-350, 10 u for 351-400. Goal a1c 7 . Continue zocor  ckd stage 3 Monitor renal function esp with increase in dose of lasix  Thrombocytopenia Chronic, stable, plt 113 in 2/16. No bleeding. Monitor clinically  OA Continue tylenol 650 mg bid and biofreeze, continue vitamin d supplement  Hyperlipidemia ldl at goal, continue zocor 20 mg daily   Oneal Grout, MD  [facility] [phone] (Monday-Friday 8 am - 5 pm) [phone] (afterhours)

## 2014-09-13 ENCOUNTER — Inpatient Hospital Stay (HOSPITAL_COMMUNITY)
Admission: EM | Admit: 2014-09-13 | Discharge: 2014-09-16 | DRG: 158 | Disposition: A | Discharge status: Skilled Nursing Facility | Payer: Medicare Other | Attending: Family Medicine | Admitting: Family Medicine

## 2014-09-13 ENCOUNTER — Emergency Department (HOSPITAL_COMMUNITY): Payer: Medicare Other

## 2014-09-13 ENCOUNTER — Encounter (HOSPITAL_COMMUNITY): Payer: Self-pay | Admitting: Emergency Medicine

## 2014-09-13 DIAGNOSIS — S060X0A Concussion without loss of consciousness, initial encounter: Secondary | ICD-10-CM

## 2014-09-13 DIAGNOSIS — F0390 Unspecified dementia without behavioral disturbance: Secondary | ICD-10-CM | POA: Diagnosis present

## 2014-09-13 DIAGNOSIS — I129 Hypertensive chronic kidney disease with stage 1 through stage 4 chronic kidney disease, or unspecified chronic kidney disease: Secondary | ICD-10-CM | POA: Diagnosis not present

## 2014-09-13 DIAGNOSIS — S0292XA Unspecified fracture of facial bones, initial encounter for closed fracture: Secondary | ICD-10-CM | POA: Diagnosis present

## 2014-09-13 DIAGNOSIS — E876 Hypokalemia: Secondary | ICD-10-CM | POA: Diagnosis present

## 2014-09-13 DIAGNOSIS — Z794 Long term (current) use of insulin: Secondary | ICD-10-CM | POA: Diagnosis not present

## 2014-09-13 DIAGNOSIS — I350 Nonrheumatic aortic (valve) stenosis: Secondary | ICD-10-CM | POA: Diagnosis present

## 2014-09-13 DIAGNOSIS — Z9049 Acquired absence of other specified parts of digestive tract: Secondary | ICD-10-CM | POA: Diagnosis not present

## 2014-09-13 DIAGNOSIS — Z888 Allergy status to other drugs, medicaments and biological substances status: Secondary | ICD-10-CM

## 2014-09-13 DIAGNOSIS — S0219XA Other fracture of base of skull, initial encounter for closed fracture: Secondary | ICD-10-CM | POA: Diagnosis present

## 2014-09-13 DIAGNOSIS — Z7901 Long term (current) use of anticoagulants: Secondary | ICD-10-CM | POA: Diagnosis not present

## 2014-09-13 DIAGNOSIS — I5022 Chronic systolic (congestive) heart failure: Secondary | ICD-10-CM | POA: Diagnosis present

## 2014-09-13 DIAGNOSIS — E785 Hyperlipidemia, unspecified: Secondary | ICD-10-CM | POA: Diagnosis not present

## 2014-09-13 DIAGNOSIS — I482 Chronic atrial fibrillation, unspecified: Secondary | ICD-10-CM | POA: Diagnosis present

## 2014-09-13 DIAGNOSIS — N179 Acute kidney failure, unspecified: Secondary | ICD-10-CM | POA: Diagnosis not present

## 2014-09-13 DIAGNOSIS — Z955 Presence of coronary angioplasty implant and graft: Secondary | ICD-10-CM

## 2014-09-13 DIAGNOSIS — E039 Hypothyroidism, unspecified: Secondary | ICD-10-CM | POA: Diagnosis not present

## 2014-09-13 DIAGNOSIS — N183 Chronic kidney disease, stage 3 unspecified: Secondary | ICD-10-CM | POA: Diagnosis present

## 2014-09-13 DIAGNOSIS — Z96642 Presence of left artificial hip joint: Secondary | ICD-10-CM | POA: Diagnosis not present

## 2014-09-13 DIAGNOSIS — Z950 Presence of cardiac pacemaker: Secondary | ICD-10-CM | POA: Diagnosis not present

## 2014-09-13 DIAGNOSIS — I2510 Atherosclerotic heart disease of native coronary artery without angina pectoris: Secondary | ICD-10-CM | POA: Diagnosis present

## 2014-09-13 DIAGNOSIS — E1122 Type 2 diabetes mellitus with diabetic chronic kidney disease: Secondary | ICD-10-CM | POA: Diagnosis not present

## 2014-09-13 DIAGNOSIS — I5042 Chronic combined systolic (congestive) and diastolic (congestive) heart failure: Secondary | ICD-10-CM | POA: Diagnosis present

## 2014-09-13 DIAGNOSIS — F419 Anxiety disorder, unspecified: Secondary | ICD-10-CM | POA: Diagnosis present

## 2014-09-13 DIAGNOSIS — E669 Obesity, unspecified: Secondary | ICD-10-CM | POA: Diagnosis present

## 2014-09-13 DIAGNOSIS — I442 Atrioventricular block, complete: Secondary | ICD-10-CM | POA: Diagnosis not present

## 2014-09-13 DIAGNOSIS — I255 Ischemic cardiomyopathy: Secondary | ICD-10-CM | POA: Diagnosis present

## 2014-09-13 DIAGNOSIS — W050XXA Fall from non-moving wheelchair, initial encounter: Secondary | ICD-10-CM | POA: Diagnosis present

## 2014-09-13 DIAGNOSIS — Z23 Encounter for immunization: Secondary | ICD-10-CM

## 2014-09-13 DIAGNOSIS — S0181XA Laceration without foreign body of other part of head, initial encounter: Secondary | ICD-10-CM | POA: Diagnosis present

## 2014-09-13 DIAGNOSIS — Z79899 Other long term (current) drug therapy: Secondary | ICD-10-CM | POA: Diagnosis not present

## 2014-09-13 DIAGNOSIS — G4700 Insomnia, unspecified: Secondary | ICD-10-CM | POA: Diagnosis not present

## 2014-09-13 DIAGNOSIS — W19XXXA Unspecified fall, initial encounter: Secondary | ICD-10-CM

## 2014-09-13 DIAGNOSIS — D649 Anemia, unspecified: Secondary | ICD-10-CM | POA: Diagnosis present

## 2014-09-13 DIAGNOSIS — Z6827 Body mass index (BMI) 27.0-27.9, adult: Secondary | ICD-10-CM | POA: Diagnosis not present

## 2014-09-13 DIAGNOSIS — F329 Major depressive disorder, single episode, unspecified: Secondary | ICD-10-CM | POA: Diagnosis present

## 2014-09-13 DIAGNOSIS — Z87891 Personal history of nicotine dependence: Secondary | ICD-10-CM | POA: Diagnosis not present

## 2014-09-13 DIAGNOSIS — K219 Gastro-esophageal reflux disease without esophagitis: Secondary | ICD-10-CM | POA: Diagnosis present

## 2014-09-13 DIAGNOSIS — Z885 Allergy status to narcotic agent status: Secondary | ICD-10-CM

## 2014-09-13 DIAGNOSIS — Z7902 Long term (current) use of antithrombotics/antiplatelets: Secondary | ICD-10-CM | POA: Diagnosis not present

## 2014-09-13 DIAGNOSIS — E538 Deficiency of other specified B group vitamins: Secondary | ICD-10-CM | POA: Diagnosis not present

## 2014-09-13 DIAGNOSIS — S02401A Maxillary fracture, unspecified, initial encounter for closed fracture: Principal | ICD-10-CM | POA: Diagnosis present

## 2014-09-13 HISTORY — DX: Presence of cardiac pacemaker: Z95.0

## 2014-09-13 LAB — CBC WITH DIFFERENTIAL/PLATELET
Basophils Absolute: 0 10*3/uL (ref 0.0–0.1)
Basophils Relative: 0 % (ref 0–1)
EOS PCT: 8 % — AB (ref 0–5)
Eosinophils Absolute: 0.8 10*3/uL — ABNORMAL HIGH (ref 0.0–0.7)
HCT: 35.9 % — ABNORMAL LOW (ref 39.0–52.0)
Hemoglobin: 12.2 g/dL — ABNORMAL LOW (ref 13.0–17.0)
LYMPHS ABS: 1.8 10*3/uL (ref 0.7–4.0)
LYMPHS PCT: 19 % (ref 12–46)
MCH: 30.4 pg (ref 26.0–34.0)
MCHC: 34 g/dL (ref 30.0–36.0)
MCV: 89.5 fL (ref 78.0–100.0)
Monocytes Absolute: 0.7 10*3/uL (ref 0.1–1.0)
Monocytes Relative: 7 % (ref 3–12)
NEUTROS ABS: 6 10*3/uL (ref 1.7–7.7)
NEUTROS PCT: 66 % (ref 43–77)
PLATELETS: 141 10*3/uL — AB (ref 150–400)
RBC: 4.01 MIL/uL — AB (ref 4.22–5.81)
RDW: 13.6 % (ref 11.5–15.5)
WBC: 9.2 10*3/uL (ref 4.0–10.5)

## 2014-09-13 LAB — GLUCOSE, CAPILLARY: Glucose-Capillary: 279 mg/dL — ABNORMAL HIGH (ref 65–99)

## 2014-09-13 LAB — URINALYSIS, ROUTINE W REFLEX MICROSCOPIC
Bilirubin Urine: NEGATIVE
Glucose, UA: 250 mg/dL — AB
Ketones, ur: NEGATIVE mg/dL
NITRITE: NEGATIVE
PROTEIN: 100 mg/dL — AB
Specific Gravity, Urine: 1.009 (ref 1.005–1.030)
UROBILINOGEN UA: 0.2 mg/dL (ref 0.0–1.0)
pH: 5.5 (ref 5.0–8.0)

## 2014-09-13 LAB — BASIC METABOLIC PANEL
Anion gap: 10 (ref 5–15)
BUN: 35 mg/dL — AB (ref 6–20)
CHLORIDE: 102 mmol/L (ref 101–111)
CO2: 24 mmol/L (ref 22–32)
CREATININE: 1.58 mg/dL — AB (ref 0.61–1.24)
Calcium: 9.1 mg/dL (ref 8.9–10.3)
GFR calc Af Amer: 42 mL/min — ABNORMAL LOW (ref >60)
GFR, EST NON AFRICAN AMERICAN: 36 mL/min — AB (ref >60)
GLUCOSE: 337 mg/dL — AB (ref 65–99)
POTASSIUM: 3.3 mmol/L — AB (ref 3.5–5.1)
Sodium: 136 mmol/L (ref 135–145)

## 2014-09-13 LAB — PROTIME-INR
INR: 1.24 (ref 0.00–1.49)
Prothrombin Time: 15.8 seconds — ABNORMAL HIGH (ref 11.6–15.2)

## 2014-09-13 LAB — URINE MICROSCOPIC-ADD ON

## 2014-09-13 LAB — DIGOXIN LEVEL: Digoxin Level: 1 ng/mL (ref 0.8–2.0)

## 2014-09-13 MED ORDER — ACETAMINOPHEN 325 MG PO TABS
650.0000 mg | ORAL_TABLET | Freq: Four times a day (QID) | ORAL | Status: DC | PRN
  Administered 2014-09-14 – 2014-09-16 (×2): 650 mg via ORAL
  Filled 2014-09-13 (×2): qty 2

## 2014-09-13 MED ORDER — ALPRAZOLAM 0.5 MG PO TABS: 0.5000 mg | ORAL_TABLET | Freq: Every evening | ORAL | Status: DC | PRN

## 2014-09-13 MED ORDER — LIDOCAINE-EPINEPHRINE (PF) 2 %-1:200000 IJ SOLN
20.0000 mL | Freq: Once | INTRAMUSCULAR | Status: AC
  Administered 2014-09-13: 20 mL
  Filled 2014-09-13: qty 20

## 2014-09-13 MED ORDER — FENTANYL CITRATE (PF) 100 MCG/2ML IJ SOLN
50.0000 ug | Freq: Once | INTRAMUSCULAR | Status: AC
  Administered 2014-09-13: 50 ug via INTRAVENOUS
  Filled 2014-09-13: qty 2

## 2014-09-13 MED ORDER — TETANUS-DIPHTH-ACELL PERTUSSIS 5-2.5-18.5 LF-MCG/0.5 IM SUSP
0.5000 mL | Freq: Once | INTRAMUSCULAR | Status: AC
  Administered 2014-09-13: 0.5 mL via INTRAMUSCULAR
  Filled 2014-09-13: qty 0.5

## 2014-09-13 MED ORDER — CEFAZOLIN SODIUM 1-5 GM-% IV SOLN: 1.0000 g | Freq: Once | INTRAVENOUS | Status: DC

## 2014-09-13 MED ORDER — LEVOTHYROXINE SODIUM 100 MCG PO TABS
100.0000 ug | ORAL_TABLET | Freq: Every day | ORAL | Status: DC
Start: 2014-09-14 — End: 2014-09-16
  Administered 2014-09-14 – 2014-09-16 (×3): 100 ug via ORAL
  Filled 2014-09-13 (×3): qty 1

## 2014-09-13 MED ORDER — SODIUM CHLORIDE 0.9 % IV SOLN
3.0000 g | Freq: Once | INTRAVENOUS | Status: AC
  Administered 2014-09-13: 3 g via INTRAVENOUS
  Filled 2014-09-13 (×2): qty 3

## 2014-09-13 MED ORDER — SODIUM CHLORIDE 0.9 % IV SOLN: 250.0000 mL | INTRAVENOUS | Status: DC | PRN

## 2014-09-13 MED ORDER — INSULIN GLARGINE 100 UNIT/ML ~~LOC~~ SOLN
40.0000 [IU] | Freq: Every day | SUBCUTANEOUS | Status: DC
Start: 2014-09-14 — End: 2014-09-16
  Administered 2014-09-14 – 2014-09-16 (×3): 40 [IU] via SUBCUTANEOUS
  Filled 2014-09-13 (×5): qty 0.4

## 2014-09-13 MED ORDER — SERTRALINE HCL 50 MG PO TABS
75.0000 mg | ORAL_TABLET | Freq: Every day | ORAL | Status: DC
  Administered 2014-09-14 – 2014-09-16 (×3): 75 mg via ORAL
  Filled 2014-09-13 (×3): qty 2

## 2014-09-13 MED ORDER — CLOPIDOGREL BISULFATE 75 MG PO TABS
75.0000 mg | ORAL_TABLET | Freq: Every day | ORAL | Status: DC
  Filled 2014-09-13: qty 1

## 2014-09-13 MED ORDER — CARVEDILOL 3.125 MG PO TABS
3.1250 mg | ORAL_TABLET | Freq: Two times a day (BID) | ORAL | Status: DC
Start: 2014-09-14 — End: 2014-09-16
  Administered 2014-09-14 – 2014-09-16 (×5): 3.125 mg via ORAL
  Filled 2014-09-13 (×5): qty 1

## 2014-09-13 MED ORDER — PANTOPRAZOLE SODIUM 20 MG PO TBEC
20.0000 mg | DELAYED_RELEASE_TABLET | Freq: Every day | ORAL | Status: DC
  Administered 2014-09-14 – 2014-09-16 (×3): 20 mg via ORAL
  Filled 2014-09-13 (×3): qty 1

## 2014-09-13 MED ORDER — INSULIN GLARGINE 100 UNIT/ML ~~LOC~~ SOLN
20.0000 [IU] | Freq: Every day | SUBCUTANEOUS | Status: DC
  Filled 2014-09-13: qty 0.2

## 2014-09-13 MED ORDER — INSULIN GLARGINE 100 UNIT/ML ~~LOC~~ SOLN
10.0000 [IU] | Freq: Every day | SUBCUTANEOUS | Status: DC
  Filled 2014-09-13: qty 0.1

## 2014-09-13 MED ORDER — METOLAZONE 2.5 MG PO TABS
2.5000 mg | ORAL_TABLET | Freq: Every day | ORAL | Status: DC
  Administered 2014-09-14 – 2014-09-15 (×2): 2.5 mg via ORAL
  Filled 2014-09-13 (×2): qty 1

## 2014-09-13 MED ORDER — SODIUM CHLORIDE 0.9 % IJ SOLN: 3.0000 mL | INTRAMUSCULAR | Status: DC | PRN

## 2014-09-13 MED ORDER — SODIUM CHLORIDE 0.9 % IV BOLUS (SEPSIS)
250.0000 mL | Freq: Once | INTRAVENOUS | Status: AC
Start: 2014-09-13 — End: 2014-09-13
  Administered 2014-09-13: 250 mL via INTRAVENOUS

## 2014-09-13 MED ORDER — FUROSEMIDE 20 MG PO TABS
20.0000 mg | ORAL_TABLET | Freq: Two times a day (BID) | ORAL | Status: DC
  Administered 2014-09-14 – 2014-09-15 (×3): 20 mg via ORAL
  Filled 2014-09-13 (×3): qty 1

## 2014-09-13 MED ORDER — ONDANSETRON HCL 4 MG/2ML IJ SOLN
INTRAMUSCULAR | Status: AC
  Filled 2014-09-13: qty 2

## 2014-09-13 MED ORDER — ONDANSETRON HCL 4 MG/2ML IJ SOLN
4.0000 mg | Freq: Once | INTRAMUSCULAR | Status: AC
  Administered 2014-09-13: 4 mg via INTRAVENOUS
  Filled 2014-09-13: qty 2

## 2014-09-13 MED ORDER — POTASSIUM CHLORIDE CRYS ER 20 MEQ PO TBCR
40.0000 meq | EXTENDED_RELEASE_TABLET | Freq: Once | ORAL | Status: AC
  Administered 2014-09-13: 40 meq via ORAL
  Filled 2014-09-13: qty 2

## 2014-09-13 MED ORDER — DIGOXIN 125 MCG PO TABS
0.1250 mg | ORAL_TABLET | Freq: Every day | ORAL | Status: DC
  Administered 2014-09-14 – 2014-09-16 (×3): 0.125 mg via ORAL
  Filled 2014-09-13 (×3): qty 1

## 2014-09-13 MED ORDER — ONDANSETRON HCL 4 MG/2ML IJ SOLN
4.0000 mg | Freq: Four times a day (QID) | INTRAMUSCULAR | Status: DC | PRN
  Administered 2014-09-14: 4 mg via INTRAVENOUS
  Filled 2014-09-13: qty 2

## 2014-09-13 MED ORDER — INSULIN ASPART 100 UNIT/ML ~~LOC~~ SOLN
0.0000 [IU] | Freq: Three times a day (TID) | SUBCUTANEOUS | Status: DC
  Administered 2014-09-14: 5 [IU] via SUBCUTANEOUS
  Administered 2014-09-14: 3 [IU] via SUBCUTANEOUS
  Administered 2014-09-14: 2 [IU] via SUBCUTANEOUS
  Administered 2014-09-15 (×2): 3 [IU] via SUBCUTANEOUS
  Administered 2014-09-16: 2 [IU] via SUBCUTANEOUS

## 2014-09-13 MED ORDER — LORATADINE 10 MG PO TABS
10.0000 mg | ORAL_TABLET | Freq: Every day | ORAL | Status: DC
  Administered 2014-09-14 – 2014-09-16 (×3): 10 mg via ORAL
  Filled 2014-09-13 (×3): qty 1

## 2014-09-13 MED ORDER — ONDANSETRON HCL 4 MG PO TABS: 4.0000 mg | ORAL_TABLET | Freq: Four times a day (QID) | ORAL | Status: DC | PRN

## 2014-09-13 MED ORDER — DOCUSATE SODIUM 100 MG PO CAPS
100.0000 mg | ORAL_CAPSULE | Freq: Two times a day (BID) | ORAL | Status: DC
  Administered 2014-09-13 – 2014-09-16 (×6): 100 mg via ORAL
  Filled 2014-09-13 (×7): qty 1

## 2014-09-13 MED ORDER — MORPHINE SULFATE 2 MG/ML IJ SOLN
2.0000 mg | INTRAMUSCULAR | Status: DC | PRN
  Administered 2014-09-14 (×2): 2 mg via INTRAVENOUS
  Filled 2014-09-13 (×2): qty 1

## 2014-09-13 MED ORDER — LIDOCAINE-EPINEPHRINE-TETRACAINE (LET) SOLUTION
3.0000 mL | Freq: Once | NASAL | Status: AC
  Administered 2014-09-13: 3 mL via TOPICAL
  Filled 2014-09-13: qty 3

## 2014-09-13 MED ORDER — ONDANSETRON HCL 4 MG/2ML IJ SOLN
4.0000 mg | Freq: Once | INTRAMUSCULAR | Status: AC
  Administered 2014-09-13: 4 mg via INTRAVENOUS

## 2014-09-13 MED ORDER — ACETAMINOPHEN 650 MG RE SUPP: 650.0000 mg | Freq: Four times a day (QID) | RECTAL | Status: DC | PRN

## 2014-09-13 MED ORDER — SIMVASTATIN 20 MG PO TABS
20.0000 mg | ORAL_TABLET | Freq: Every day | ORAL | Status: DC
  Administered 2014-09-14 – 2014-09-15 (×2): 20 mg via ORAL
  Filled 2014-09-13 (×2): qty 1

## 2014-09-13 MED ORDER — TEMAZEPAM 7.5 MG PO CAPS: 7.5000 mg | ORAL_CAPSULE | Freq: Every evening | ORAL | Status: DC | PRN

## 2014-09-13 MED ORDER — SODIUM CHLORIDE 0.9 % IJ SOLN
3.0000 mL | Freq: Two times a day (BID) | INTRAMUSCULAR | Status: DC
  Administered 2014-09-13 – 2014-09-16 (×2): 3 mL via INTRAVENOUS

## 2014-09-13 NOTE — ED Notes (Signed)
Attempted report 
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
## 2014-09-13 NOTE — ED Notes (Signed)
Attempted report x1. 

## 2014-09-13 NOTE — ED Notes (Signed)
Patient vomiting blood. MD notified.

## 2014-09-13 NOTE — ED Notes (Signed)
Pt vomited. MD at bedside. Gave zofran

## 2014-09-13 NOTE — ED Notes (Signed)
Hospitalist is at bedside.
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
## 2014-09-13 NOTE — ED Provider Notes (Signed)
D/w dr Suszanne Conners We discussed imaging No acute intervention Any surgical needs can be deferred for outpatient   Zadie Rhine, MD 09/13/14 1726

## 2014-09-13 NOTE — ED Notes (Signed)
MD sutured L eye / forehead. Pressure held over site

## 2014-09-13 NOTE — H&P (Addendum)
[facility] History and Physical  [name] QMV:[id number] DOB: 1920-01-29 DOA: 09/13/2014  Referring physician: Dr. Bebe Shaggy, EDP PCP: Oneal Grout, MD  Specialists:   Chief Complaint: Fall  HPI: [name] is a 81 y.o. male  With a history of atrial fibrillation, chronic systolic heart failure, complete heart block status post pacemaker, dementia, presented to the emergency department after sustaining a fall at the nursing facility. It seems that patient's wheelchair had "gotten stuck", and patient fell out of the wheelchair hitting the pavement. Upon arrival to the emergency department, patient was noted to have facial swelling. CT of the face showed maxillary fracture. EDP call trauma surgery as well as ENT. ENT will see patient in the morning. [facility] was called for admission for pain management.  Review of Systems:  Constitutional: Denies fever, chills, diaphoresis, appetite change and fatigue.  HEENT: Complains of facial pain.    Respiratory: Denies SOB, DOE, cough, chest tightness,  and wheezing.   Cardiovascular: Denies chest pain, palpitations and leg swelling.  Gastrointestinal: Denies nausea, vomiting, abdominal pain, diarrhea, constipation, blood in stool and abdominal distention.  Genitourinary: Denies dysuria, urgency, frequency, hematuria, flank pain and difficulty urinating.  Musculoskeletal: Complains of pain.  Skin: Denies pallor, rash and wound.  Neurological: Denies dizziness, seizures, syncope, weakness, light-headedness, numbness and headaches.  Hematological: Denies adenopathy. Easy bruising, personal or family bleeding history  Psychiatric/Behavioral: Denies suicidal ideation, mood changes, confusion, nervousness, sleep disturbance and agitation  Past Medical History  Diagnosis Date  . Altered mental status     felt to be secondary to acute delirium on top of a suspected underlying dementia  . Acute renal failure     from dehydration brought on by  fall  . Pelvic fracture     a. s/p fall  . Depression   . Chronic systolic and diastolic heart failure   . Ischemic cardiomyopathy   . Coronary artery disease     a. s/p PTCA of LAD 2004  . Hypertension   . Obesity   . Hypothyroidism   . B12 deficiency   . Mild aortic stenosis   . Dementia     Questionable dementia  . Complete heart block     status post dual-chamber pacemaker   Past Surgical History  Procedure Laterality Date  . Cholecystectomy    . Hemiarthroplasty hip  Aug 07, 2004    Left  . Circumcision  June 21, 2005  . Pacemaker insertion  2004; 01/2013    MDT pacemaker implanted 2004; gen change by Dr Johney Frame to MDT [id number] 01/2013  . Pacemaker generator change N/A 01/27/2013    Procedure: PACEMAKER GENERATOR CHANGE;  Surgeon: Gardiner Rhyme, MD;  Location: [facility] CATH LAB;  Service: Cardiovascular;  Laterality: N/A;   Social History:  reports that he has quit smoking. He does not have any smokeless tobacco history on file. He reports that he does not drink alcohol or use illicit drugs.   Allergies  Allergen Reactions  . Ativan [Lorazepam]     REACTION: unkown  . Codeine     REACTION: unknown  . Promethazine Hcl     REACTION: unknown    Family history -Patient does not know family history. No family present at this time for information. Patient does have dementia.   Prior to Admission medications   Medication Sig Start Date End Date Taking? Authorizing Provider  acetaminophen (TYLENOL) 325 MG tablet Take 650 mg by mouth 2 (two) times daily. For osteoarthritic pain  Historical Provider, MD  ALPRAZolam Prudy Feeler) 0.5 MG tablet Take one tablet by mouth every night at bedtime for anxiety/insomnia; Take one tablet by mouth once daily as needed for anxiety 01/27/14   Tiffany L Reed, DO  AMBULATORY NON FORMULARY MEDICATION Medication Name: Med Pass 240 mL of fluid by mouth three times daily    Historical Provider, MD  apixaban (ELIQUIS) 2.5 MG TABS tablet Take 2.5 mg by  mouth 2 (two) times daily.    Historical Provider, MD  carvedilol (COREG) 3.125 MG tablet Take 3.125 mg by mouth 2 (two) times daily with a meal. For HTN    Historical Provider, MD  cholecalciferol (VITAMIN D) 1000 UNITS tablet Take 1,000 Units by mouth daily.    Historical Provider, MD  clopidogrel (PLAVIX) 75 MG tablet Take 75 mg by mouth daily.      Historical Provider, MD  Cyanocobalamin (VITAMIN B-12 IJ) Inject 1,000 mcg as directed every 30 (thirty) days. On the 26th of every month    Historical Provider, MD  digoxin (LANOXIN) 0.125 MG tablet Take 0.125 mg by mouth daily.    Historical Provider, MD  docusate sodium (COLACE) 100 MG capsule Take 100 mg by mouth 2 (two) times daily. For constipation    Historical Provider, MD  furosemide (LASIX) 20 MG tablet 40 mg am and 20 mg pm    Historical Provider, MD  insulin glargine (LANTUS) 100 UNIT/ML injection 40 units in the am, 35 units in the pm    Historical Provider, MD  insulin lispro (HUMALOG) 100 UNIT/ML injection 5 units subcutaneously three times daily with meals for BS > 200     Historical Provider, MD  lactulose, encephalopathy, (CHRONULAC) 10 GM/15ML SOLN Take by mouth. Take 15 ml by mouth daily for constipation    Historical Provider, MD  levothyroxine (SYNTHROID, LEVOTHROID) 100 MCG tablet Take 100 mcg by mouth daily.      Historical Provider, MD  loratadine (CLARITIN) 10 MG tablet Take 10 mg by mouth daily. For rhinitis    Historical Provider, MD  Menthol, Topical Analgesic, 4 % GEL Apply topically. Apply to posterior neck twice daily for chronic pain    Historical Provider, MD  metolazone (ZAROXOLYN) 2.5 MG tablet Take 2.5 mg by mouth daily. Take 1 tablet by mouth every morning for weight    Historical Provider, MD  Multiple Vitamins-Minerals (CERTA-VITE PO) Take by mouth. Take 1 tablet by mouth daily    Historical Provider, MD  pantoprazole (PROTONIX) 40 MG tablet Take 20 mg by mouth daily.     Historical Provider, MD  Protein  (PROCEL) POWD Take by mouth. Administer 1 scoop by mouth twice daily for supplement    Historical Provider, MD  sertraline (ZOLOFT) 25 MG tablet Take 75 mg by mouth daily. For depression    Historical Provider, MD  simvastatin (ZOCOR) 20 MG tablet Take 20 mg by mouth. Take 1 tablet by mouth every evening for Hyperlipidemia    Historical Provider, MD  temazepam (RESTORIL) 7.5 MG capsule Take one capsule by mouth every night at bedtime for insomnia 05/26/14   Sharon Seller, NP   Physical Exam: Filed Vitals:   09/13/14 1745  BP: 121/48  Pulse: 70  Resp: 18     General: Well developed, well nourished, NAD, appears stated age  HEENT: [place], Left frontal hematoma, ecchymosis, multiple abrasions of the face, mild edema and bruising of the nasal bridge, moderate bruising and left-sided. Orbital swelling. Difficult to examine left eye due to swelling  and bruising. mucous membranes moist.   Neck: Supple, no JVD, no masses  Cardiovascular: S1 S2 auscultated, 2/6 SEM/  Respiratory: Clear to auscultation bilaterally with equal chest rise  Abdomen: Soft, nontender, nondistended, + bowel sounds  Extremities: warm dry without cyanosis clubbing. Mild LE edema B/L.   Neuro: AAOx2, Difficult to examine left eye due to hematoma and swelling, otherwise, CN intact, strength normal.  Skin: Multiple abrasions and bruising noted. No rashes.  Psych: Normal affect and demeanor, pleasant  Labs on Admission:  Basic Metabolic Panel:  Recent Labs Lab 09/13/14 1404  NA 136  K 3.3*  CL 102  CO2 24  GLUCOSE 337*  BUN 35*  CREATININE 1.58*  CALCIUM 9.1   Liver Function Tests: No results for input(s): AST, ALT, ALKPHOS, BILITOT, PROT, ALBUMIN in the last 168 hours. No results for input(s): LIPASE, AMYLASE in the last 168 hours. No results for input(s): AMMONIA in the last 168 hours. CBC:  Recent Labs Lab 09/13/14 1404  WBC 9.2  NEUTROABS 6.0  HGB 12.2*  HCT 35.9*  MCV 89.5  PLT 141*    Cardiac Enzymes: No results for input(s): CKTOTAL, CKMB, CKMBINDEX, TROPONINI in the last 168 hours.  BNP (last 3 results) No results for input(s): BNP in the last 8760 hours.  ProBNP (last 3 results) No results for input(s): PROBNP in the last 8760 hours.  CBG: No results for input(s): GLUCAP in the last 168 hours.  Radiological Exams on Admission: Dg Chest 1 View  09/13/2014   CLINICAL DATA:  Fall today.  Fall from wheelchair.  EXAM: CHEST  1 VIEW  COMPARISON:  04/29/2009.  FINDINGS: Cardiomegaly is present. There is no failure. Two lead LEFT subclavian cardiac pacemaker. No airspace disease. No displaced fractures or pneumothorax. No pleural effusion.  IMPRESSION: Cardiomegaly without failure.   Electronically Signed   By: Andreas Newport M.D.   On: 09/13/2014 15:41   Dg Pelvis 1-2 Views  09/13/2014   CLINICAL DATA:  Fall today  EXAM: PELVIS - 1-2 VIEW  COMPARISON:  04/26/2009  FINDINGS: Left hip hemiarthroplasty in satisfactory position and alignment.  Healed fractures of the left acetabulum and left superior and inferior pubic rami.  Negative for acute fracture or dislocation  IMPRESSION: No acute abnormality.   Electronically Signed   By: Marlan Palau M.D.   On: 09/13/2014 15:40   Ct Head Wo Contrast  09/13/2014   CLINICAL DATA:  Larey Seat from wheelchair when chair hit a knee then pavement, no loss of consciousness, frontal puncture wound, LEFT infraorbital laceration, on blood thinners, history CHF, ischemic cardiomyopathy, coronary artery disease, hypertension, type II diabetes mellitus, former smoker  EXAM: CT HEAD WITHOUT CONTRAST  CT MAXILLOFACIAL WITHOUT CONTRAST  CT CERVICAL SPINE WITHOUT CONTRAST  TECHNIQUE: Multidetector CT imaging of the head, cervical spine, and maxillofacial structures were performed using the standard protocol without intravenous contrast. Multiplanar CT image reconstructions of the cervical spine and maxillofacial structures were also generated. RIGHT-side  of face marked with a BB.  COMPARISON:  CT head 12/26/2009  FINDINGS: CT HEAD FINDINGS  Generalized atrophy.  Stable ex vacuo dilatation of the ventricular system.  No midline shift or mass effect.  Small vessel chronic ischemic changes of deep cerebral white matter.  Old RIGHT basal ganglia lacunar infarct.  No intracranial hemorrhage, mass lesion, or evidence acute infarction.  No extra-axial fluid collections.  LEFT frontal bone fracture, see below.  CT MAXILLOFACIAL FINDINGS  Large scalp hematoma LEFT frontal extending LEFT periorbital, to LEFT  nose, and LEFT cheek.  Nasal septal deviation to the LEFT.  Complete opacification of the RIGHT maxillary sinus with high attenuation question blood.  Partial opacification of sphenoid sinus, ethmoid air cells LEFT greater than RIGHT, LEFT frontal sinus, and LEFT maxillary sinus.  Depressed fracture involving the anterior wall of the LEFT frontal sinus, extending into the LEFT orbital roof with associated LEFT orbital pneumatosis.  No definite pneumocephalus is seen though a fracture plane at the posterior wall LEFT frontal sinus traverses the LEFT frontal bone.  Single tiny focus of pneumocephalus at the inferior aspect of the RIGHT orbit, likely nondisplaced anterior RIGHT maxillary wall fracture extending into inferior RIGHT orbital wall.  High attenuation within RIGHT maxillary sinus may represent a hematoma.  Remaining orbital walls and facial bones appear grossly intact.  CT CERVICAL SPINE FINDINGS  Osseous demineralization.  Visualized skullbase intact.  Multilevel facet degenerative changes.  Diffuse disc space narrowing with endplate spur formation at C5-C6 and C6-C7.  Vertebral body heights maintained without fracture or subluxation.  No bone destruction.  Lung apices clear.  Distended esophagus containing food debris and fluid.  Scattered atherosclerotic calcifications.  IMPRESSION: Generalized atrophy with small vessel chronic ischemic changes of deep cerebral  white matter.  Tiny old lacunar infarct RIGHT basal ganglia.  No acute intracranial abnormalities.  Large LEFT facial/frontal scalp hematoma.  Depressed fracture involving anterior wall of LEFT frontal sinus extending into LEFT orbital roof.  Nondisplaced fracture planes involving the posterior wall of the LEFT frontal sinus without definite pneumocephalus or intracranial hematoma.  Probable nondisplaced fracture anterior wall RIGHT maxillary sinus extending to the RIGHT inferior orbital wall with minimal in RIGHT orbital pneumatosis.  Probable RIGHT maxillary sinus hematoma.  Multilevel degenerative disc and facet disease changes of the cervical spine.  No acute cervical spine abnormalities.  Findings called to Dr. Romeo Apple on 09/13/2014 at 1653 hours.   Electronically Signed   By: Ulyses Southward M.D.   On: 09/13/2014 16:54   Ct Cervical Spine Wo Contrast  09/13/2014   CLINICAL DATA:  Larey Seat from wheelchair when chair hit a knee then pavement, no loss of consciousness, frontal puncture wound, LEFT infraorbital laceration, on blood thinners, history CHF, ischemic cardiomyopathy, coronary artery disease, hypertension, type II diabetes mellitus, former smoker  EXAM: CT HEAD WITHOUT CONTRAST  CT MAXILLOFACIAL WITHOUT CONTRAST  CT CERVICAL SPINE WITHOUT CONTRAST  TECHNIQUE: Multidetector CT imaging of the head, cervical spine, and maxillofacial structures were performed using the standard protocol without intravenous contrast. Multiplanar CT image reconstructions of the cervical spine and maxillofacial structures were also generated. RIGHT-side of face marked with a BB.  COMPARISON:  CT head 12/26/2009  FINDINGS: CT HEAD FINDINGS  Generalized atrophy.  Stable ex vacuo dilatation of the ventricular system.  No midline shift or mass effect.  Small vessel chronic ischemic changes of deep cerebral white matter.  Old RIGHT basal ganglia lacunar infarct.  No intracranial hemorrhage, mass lesion, or evidence acute infarction.   No extra-axial fluid collections.  LEFT frontal bone fracture, see below.  CT MAXILLOFACIAL FINDINGS  Large scalp hematoma LEFT frontal extending LEFT periorbital, to LEFT nose, and LEFT cheek.  Nasal septal deviation to the LEFT.  Complete opacification of the RIGHT maxillary sinus with high attenuation question blood.  Partial opacification of sphenoid sinus, ethmoid air cells LEFT greater than RIGHT, LEFT frontal sinus, and LEFT maxillary sinus.  Depressed fracture involving the anterior wall of the LEFT frontal sinus, extending into the LEFT orbital roof with associated LEFT  orbital pneumatosis.  No definite pneumocephalus is seen though a fracture plane at the posterior wall LEFT frontal sinus traverses the LEFT frontal bone.  Single tiny focus of pneumocephalus at the inferior aspect of the RIGHT orbit, likely nondisplaced anterior RIGHT maxillary wall fracture extending into inferior RIGHT orbital wall.  High attenuation within RIGHT maxillary sinus may represent a hematoma.  Remaining orbital walls and facial bones appear grossly intact.  CT CERVICAL SPINE FINDINGS  Osseous demineralization.  Visualized skullbase intact.  Multilevel facet degenerative changes.  Diffuse disc space narrowing with endplate spur formation at C5-C6 and C6-C7.  Vertebral body heights maintained without fracture or subluxation.  No bone destruction.  Lung apices clear.  Distended esophagus containing food debris and fluid.  Scattered atherosclerotic calcifications.  IMPRESSION: Generalized atrophy with small vessel chronic ischemic changes of deep cerebral white matter.  Tiny old lacunar infarct RIGHT basal ganglia.  No acute intracranial abnormalities.  Large LEFT facial/frontal scalp hematoma.  Depressed fracture involving anterior wall of LEFT frontal sinus extending into LEFT orbital roof.  Nondisplaced fracture planes involving the posterior wall of the LEFT frontal sinus without definite pneumocephalus or intracranial  hematoma.  Probable nondisplaced fracture anterior wall RIGHT maxillary sinus extending to the RIGHT inferior orbital wall with minimal in RIGHT orbital pneumatosis.  Probable RIGHT maxillary sinus hematoma.  Multilevel degenerative disc and facet disease changes of the cervical spine.  No acute cervical spine abnormalities.  Findings called to Dr. Romeo Apple on 09/13/2014 at 1653 hours.   Electronically Signed   By: Ulyses Southward M.D.   On: 09/13/2014 16:54   Dg Hand Complete Left  09/13/2014   CLINICAL DATA:  Fall, bilateral hand pain  EXAM: LEFT HAND - COMPLETE 3+ VIEW  COMPARISON:  None.  FINDINGS: No fracture or dislocation is seen.  The joint spaces are preserved.  The visualized soft tissues are unremarkable.  IMPRESSION: No fracture or dislocation is seen.   Electronically Signed   By: Charline Bills M.D.   On: 09/13/2014 15:41   Dg Hand Complete Right  09/13/2014   CLINICAL DATA:  Larey Seat out of wheelchair today, BILATERAL hand pain  EXAM: RIGHT HAND - COMPLETE 3+ VIEW  COMPARISON:  None  FINDINGS: Osseous mineralization grossly normal for age.  Joint spaces preserved.  Fingers superimposed on lateral view limiting assessment.  No acute fracture, dislocation, or bone destruction.  Small vessel vascular calcification at wrist.  IMPRESSION: No acute osseous abnormalities.   Electronically Signed   By: Ulyses Southward M.D.   On: 09/13/2014 15:41   Ct Maxillofacial Wo Cm  09/13/2014   CLINICAL DATA:  Larey Seat from wheelchair when chair hit a knee then pavement, no loss of consciousness, frontal puncture wound, LEFT infraorbital laceration, on blood thinners, history CHF, ischemic cardiomyopathy, coronary artery disease, hypertension, type II diabetes mellitus, former smoker  EXAM: CT HEAD WITHOUT CONTRAST  CT MAXILLOFACIAL WITHOUT CONTRAST  CT CERVICAL SPINE WITHOUT CONTRAST  TECHNIQUE: Multidetector CT imaging of the head, cervical spine, and maxillofacial structures were performed using the standard protocol  without intravenous contrast. Multiplanar CT image reconstructions of the cervical spine and maxillofacial structures were also generated. RIGHT-side of face marked with a BB.  COMPARISON:  CT head 12/26/2009  FINDINGS: CT HEAD FINDINGS  Generalized atrophy.  Stable ex vacuo dilatation of the ventricular system.  No midline shift or mass effect.  Small vessel chronic ischemic changes of deep cerebral white matter.  Old RIGHT basal ganglia lacunar infarct.  No intracranial hemorrhage,  mass lesion, or evidence acute infarction.  No extra-axial fluid collections.  LEFT frontal bone fracture, see below.  CT MAXILLOFACIAL FINDINGS  Large scalp hematoma LEFT frontal extending LEFT periorbital, to LEFT nose, and LEFT cheek.  Nasal septal deviation to the LEFT.  Complete opacification of the RIGHT maxillary sinus with high attenuation question blood.  Partial opacification of sphenoid sinus, ethmoid air cells LEFT greater than RIGHT, LEFT frontal sinus, and LEFT maxillary sinus.  Depressed fracture involving the anterior wall of the LEFT frontal sinus, extending into the LEFT orbital roof with associated LEFT orbital pneumatosis.  No definite pneumocephalus is seen though a fracture plane at the posterior wall LEFT frontal sinus traverses the LEFT frontal bone.  Single tiny focus of pneumocephalus at the inferior aspect of the RIGHT orbit, likely nondisplaced anterior RIGHT maxillary wall fracture extending into inferior RIGHT orbital wall.  High attenuation within RIGHT maxillary sinus may represent a hematoma.  Remaining orbital walls and facial bones appear grossly intact.  CT CERVICAL SPINE FINDINGS  Osseous demineralization.  Visualized skullbase intact.  Multilevel facet degenerative changes.  Diffuse disc space narrowing with endplate spur formation at C5-C6 and C6-C7.  Vertebral body heights maintained without fracture or subluxation.  No bone destruction.  Lung apices clear.  Distended esophagus containing food  debris and fluid.  Scattered atherosclerotic calcifications.  IMPRESSION: Generalized atrophy with small vessel chronic ischemic changes of deep cerebral white matter.  Tiny old lacunar infarct RIGHT basal ganglia.  No acute intracranial abnormalities.  Large LEFT facial/frontal scalp hematoma.  Depressed fracture involving anterior wall of LEFT frontal sinus extending into LEFT orbital roof.  Nondisplaced fracture planes involving the posterior wall of the LEFT frontal sinus without definite pneumocephalus or intracranial hematoma.  Probable nondisplaced fracture anterior wall RIGHT maxillary sinus extending to the RIGHT inferior orbital wall with minimal in RIGHT orbital pneumatosis.  Probable RIGHT maxillary sinus hematoma.  Multilevel degenerative disc and facet disease changes of the cervical spine.  No acute cervical spine abnormalities.  Findings called to Dr. Romeo Apple on 09/13/2014 at 1653 hours.   Electronically Signed   By: Ulyses Southward M.D.   On: 09/13/2014 16:54    EKG: None  Assessment/Plan  Facial fracture due to mechanical fall -Patient will be admitted to medical floor -CT maxillofacial/CT head showed no acute intracranial abnormalities, old lacunar infarct, left frontal scalp and facial hematoma. Right maxillary sinus hematoma, nondisplaced fracture posterior wall of the left frontal sinus. -EDP spoke with Dr. Suszanne Conners and Dr. Renaldo Fiddler who will see the patient.  No acute surgical intervention.   -Continue pain control  -Will place patient on unasyn empirically -Will consult PT/OT/Speech  History of complete heart block -Patient has pacemaker in place  Hypokalemia -Like a secondary diuretics -Will supplement and continue to monitor  Chronic kidney disease, stage 3 -Creatinine appears to be at baseline  Dementia -Mild, not on any medication  Atrial fibrillation -Patient takes Eliquis -Due to fall and current trauma will hold -Given patient's age and fall risk, would consider  stopping medication.  To be discussed with family.  -Continue digoxin and Coreg  Chronic systolic heart failure/ischemic cardiomyopathy  -Continue diuretics  -Monitor intake and output, daily weights  -Echocardiogram from 2010 shows an EF of 40-45%   Diabetes mellitus -Will place patient on Lantus, sliding scale, CBG monitoring.  Hypothyroidism -Continue Synthroid  Hyperlipidemia -Continue statin  GERD -Continue Protonix  Insomnia -Continue temazepam  DVT prophylaxis: SCDs  Code Status: Full  Condition: Guarded  Family  Communication: None at bedside.  Attempted to reach daughter, Darl Pikes [phone]  Disposition Plan: Admitted for observation and pain control.   Time spent: 60 minutes  Mosie Angus D.O. [facility] Pager [phone]  If 7PM-7AM, please contact night-coverage [email or web address] Password [facility] 09/13/2014, 5:59 PM

## 2014-09-13 NOTE — ED Provider Notes (Signed)
Plan is to f/u CT imaging (head/cspine/maxillofacial) May need admission for monitoring  Zadie Rhine, MD 09/13/14 [phone]

## 2014-09-13 NOTE — ED Notes (Signed)
Call Daughter or son at [phone] or [phone] anytime if you need anything.

## 2014-09-13 NOTE — ED Notes (Signed)
Patient room air sats 95% at this time. Nasal cannula removed. Patient resting with eyes closed, with family at bedside.

## 2014-09-13 NOTE — ED Provider Notes (Signed)
CSN: [id number]     Arrival date & time 09/13/14  1323 History   First MD Initiated Contact with Patient 09/13/14 1324     Chief Complaint  Patient presents with  . Fall     (Consider location/radiation/quality/duration/timing/severity/associated sxs/prior Treatment) Patient is a 81 y.o. male presenting with fall. The history is provided by the patient.  Fall This is a new problem. The current episode started less than 1 hour ago. Episode frequency: once. The problem has been resolved. Pertinent negatives include no chest pain, no abdominal pain, no headaches and no shortness of breath. Nothing aggravates the symptoms. Nothing relieves the symptoms. He has tried nothing for the symptoms. The treatment provided no relief.    Past Medical History  Diagnosis Date  . Altered mental status     felt to be secondary to acute delirium on top of a suspected underlying dementia  . Acute renal failure     from dehydration brought on by fall  . Pelvic fracture     a. s/p fall  . Depression   . Chronic systolic and diastolic heart failure   . Ischemic cardiomyopathy   . Coronary artery disease     a. s/p PTCA of LAD 2004  . Hypertension   . Obesity   . Hypothyroidism   . B12 deficiency   . Mild aortic stenosis   . Dementia     Questionable dementia  . Complete heart block     status post dual-chamber pacemaker   Past Surgical History  Procedure Laterality Date  . Cholecystectomy    . Hemiarthroplasty hip  Aug 07, 2004    Left  . Circumcision  June 21, 2005  . Pacemaker insertion  2004; 01/2013    MDT pacemaker implanted 2004; gen change by Dr Johney Frame to MDT [id number] 01/2013  . Pacemaker generator change N/A 01/27/2013    Procedure: PACEMAKER GENERATOR CHANGE;  Surgeon: Gardiner Rhyme, MD;  Location: [facility] CATH LAB;  Service: Cardiovascular;  Laterality: N/A;   No family history on file. History  Substance Use Topics  . Smoking status: Former Games developer  . Smokeless tobacco: Not on file    . Alcohol Use: No    Review of Systems  Constitutional: Negative for fever.  HENT: Negative for drooling and rhinorrhea.   Eyes: Negative for pain.  Respiratory: Negative for cough and shortness of breath.   Cardiovascular: Negative for chest pain and leg swelling.  Gastrointestinal: Negative for nausea, vomiting, abdominal pain and diarrhea.  Genitourinary: Negative for dysuria and hematuria.  Musculoskeletal: Negative for gait problem and neck pain.  Skin: Negative for color change.  Neurological: Negative for numbness and headaches.  Hematological: Negative for adenopathy.  Psychiatric/Behavioral: Negative for behavioral problems.  All other systems reviewed and are negative.     Allergies  Ativan; Codeine; and Promethazine hcl  Home Medications   Prior to Admission medications   Medication Sig Start Date End Date Taking? Authorizing Provider  acetaminophen (TYLENOL) 325 MG tablet Take 650 mg by mouth 2 (two) times daily. For osteoarthritic pain    Historical Provider, MD  ALPRAZolam Prudy Feeler) 0.5 MG tablet Take one tablet by mouth every night at bedtime for anxiety/insomnia; Take one tablet by mouth once daily as needed for anxiety 01/27/14   Tiffany L Reed, DO  AMBULATORY NON FORMULARY MEDICATION Medication Name: Med Pass 240 mL of fluid by mouth three times daily    Historical Provider, MD  apixaban (ELIQUIS) 2.5 MG TABS tablet Take 2.5  mg by mouth 2 (two) times daily.    Historical Provider, MD  carvedilol (COREG) 3.125 MG tablet Take 3.125 mg by mouth 2 (two) times daily with a meal. For HTN    Historical Provider, MD  cholecalciferol (VITAMIN D) 1000 UNITS tablet Take 1,000 Units by mouth daily.    Historical Provider, MD  clopidogrel (PLAVIX) 75 MG tablet Take 75 mg by mouth daily.      Historical Provider, MD  Cyanocobalamin (VITAMIN B-12 IJ) Inject 1,000 mcg as directed every 30 (thirty) days. On the 26th of every month    Historical Provider, MD  digoxin (LANOXIN)  0.125 MG tablet Take 0.125 mg by mouth daily.    Historical Provider, MD  docusate sodium (COLACE) 100 MG capsule Take 100 mg by mouth 2 (two) times daily. For constipation    Historical Provider, MD  furosemide (LASIX) 20 MG tablet 40 mg am and 20 mg pm    Historical Provider, MD  insulin glargine (LANTUS) 100 UNIT/ML injection 40 units in the am, 35 units in the pm    Historical Provider, MD  insulin lispro (HUMALOG) 100 UNIT/ML injection 5 units subcutaneously three times daily with meals for BS > 200     Historical Provider, MD  lactulose, encephalopathy, (CHRONULAC) 10 GM/15ML SOLN Take by mouth. Take 15 ml by mouth daily for constipation    Historical Provider, MD  levothyroxine (SYNTHROID, LEVOTHROID) 100 MCG tablet Take 100 mcg by mouth daily.      Historical Provider, MD  loratadine (CLARITIN) 10 MG tablet Take 10 mg by mouth daily. For rhinitis    Historical Provider, MD  Menthol, Topical Analgesic, 4 % GEL Apply topically. Apply to posterior neck twice daily for chronic pain    Historical Provider, MD  metolazone (ZAROXOLYN) 2.5 MG tablet Take 2.5 mg by mouth daily. Take 1 tablet by mouth every morning for weight    Historical Provider, MD  Multiple Vitamins-Minerals (CERTA-VITE PO) Take by mouth. Take 1 tablet by mouth daily    Historical Provider, MD  pantoprazole (PROTONIX) 40 MG tablet Take 20 mg by mouth daily.     Historical Provider, MD  Protein (PROCEL) POWD Take by mouth. Administer 1 scoop by mouth twice daily for supplement    Historical Provider, MD  sertraline (ZOLOFT) 25 MG tablet Take 75 mg by mouth daily. For depression    Historical Provider, MD  simvastatin (ZOCOR) 20 MG tablet Take 20 mg by mouth. Take 1 tablet by mouth every evening for Hyperlipidemia    Historical Provider, MD  temazepam (RESTORIL) 7.5 MG capsule Take one capsule by mouth every night at bedtime for insomnia 05/26/14   Sharon Seller, NP   BP 171/64 mmHg  Pulse 72  Resp 20  SpO2 94% Physical Exam   Constitutional: He appears well-developed and well-nourished.  HENT:  Head:    Right Ear: External ear normal.  Left Ear: External ear normal.  Mouth/Throat: Oropharynx is clear and moist. No oropharyngeal exudate.  Multiple abrasions of the face.  Mild swelling and bruising to the nasal bridge.  Eyes: Conjunctivae and EOM are normal. Pupils are equal, round, and reactive to light.  Moderate bruising and left-sided periorbital swelling.  I was able to get a brief look at the left eye which showed no obvious superficial injury. Pupils were equal round and reactive. He appeared to have normal EOM in the Left eye.   Neck: Normal range of motion. Neck supple.  No vertebral tenderness.  Cardiovascular: Normal rate, regular rhythm, normal heart sounds and intact distal pulses.  Exam reveals no gallop and no friction rub.   No murmur heard. Pulmonary/Chest: Effort normal and breath sounds normal. No respiratory distress. He has no wheezes.  Abdominal: Soft. Bowel sounds are normal. He exhibits no distension. There is no tenderness. There is no rebound and no guarding.  Musculoskeletal: Normal range of motion. He exhibits edema (mild pitting edema in distal lower extremities). He exhibits no tenderness.  Mild abrasions to bilateral knees.  Normal range of motion of the knees and hips bilaterally.  Multiple abrasions to bilateral hands.  Normal  motor skill motor skills of the hands bilaterally.   Elliptical 2cm skin tear overlying mcp of right hand, 5th digit.   Neurological: He is alert.  A/o x 2. Doesn't know year.   Normal strength and sensation in all extremities.   Skin: Skin is warm and dry.  Psychiatric: He has a normal mood and affect. His behavior is normal.  Nursing note and vitals reviewed.   ED Course  LACERATION REPAIR Date/Time: 09/13/2014 10:25 PM Performed by: Purvis Sheffield Authorized by: Purvis Sheffield Consent: The procedure was performed in an emergent  situation. Verbal consent obtained. Written consent not obtained. Risks and benefits: risks, benefits and alternatives were discussed Consent given by: patient Patient identity confirmed: verbally with patient, arm band, provided demographic data and hospital-assigned identification number Time out: Immediately prior to procedure a "time out" was called to verify the correct patient, procedure, equipment, support staff and site/side marked as required. Location: forehead. Laceration length: 1 cm Foreign bodies: no foreign bodies Tendon involvement: none Nerve involvement: none Vascular damage: no Anesthesia: local infiltration Local anesthetic: lidocaine 2% with epinephrine Anesthetic total: 2 ml Patient sedated: no Irrigation solution: Wound wiped w/ gauze soaked w/ sterile water, unable to irrigate due to brisk active bleeding. Debridement: none Degree of undermining: none Skin closure: 5-0 Prolene Number of sutures: 2 Technique: simple Approximation: close Approximation difficulty: simple Dressing: 4x4 sterile gauze Patient tolerance: Patient tolerated the procedure well with no immediate complications  LACERATION REPAIR Date/Time: 09/13/2014 10:28 PM Performed by: Purvis Sheffield Authorized by: Purvis Sheffield Consent: The procedure was performed in an emergent situation. Verbal consent obtained. Written consent not obtained. Risks and benefits: risks, benefits and alternatives were discussed Consent given by: patient Patient understanding: patient states understanding of the procedure being performed Patient identity confirmed: verbally with patient, arm band, provided demographic data and hospital-assigned identification number Time out: Immediately prior to procedure a "time out" was called to verify the correct patient, procedure, equipment, support staff and site/side marked as required. Location: left infraorbital area. Wound length (cm): 3 cm laceration extending to  the lower eye lid, also a approx 2x3 cm area with avulsion of itissue. Foreign bodies: no foreign bodies Tendon involvement: none Nerve involvement: none Vascular damage: no Anesthesia: local infiltration Local anesthetic: lidocaine 2% with epinephrine Anesthetic total: 3 ml Patient sedated: no Irrigation solution: saline Irrigation method: Wound wiped w/ gauze soaked w/ sterile water, unable to irrigate due to brisk active bleeding. Debridement: none Degree of undermining: none Wound skin closure material used: Four 5-0 prolene sutures placed on the area with tissue avulsion. Two 6-0 sutures placed at the llaceration extending to the lower eyelid on the left. Number of sutures: 6. Technique: simple Approximation: close Approximation difficulty: simple Dressing: 4x4 sterile gauze Patient tolerance: Patient tolerated the procedure well with no immediate complications   (including critical care time) Labs Review  Labs Reviewed  CBC WITH DIFFERENTIAL/PLATELET - Abnormal; Notable for the following:    RBC 4.01 (*)    Hemoglobin 12.2 (*)    HCT 35.9 (*)    Platelets 141 (*)    Eosinophils Relative 8 (*)    Eosinophils Absolute 0.8 (*)    All other components within normal limits  BASIC METABOLIC PANEL  PROTIME-INR  URINALYSIS, ROUTINE W REFLEX MICROSCOPIC (NOT AT [facility])    Imaging Review Dg Chest 1 View  09/13/2014   CLINICAL DATA:  Fall today.  Fall from wheelchair.  EXAM: CHEST  1 VIEW  COMPARISON:  04/29/2009.  FINDINGS: Cardiomegaly is present. There is no failure. Two lead LEFT subclavian cardiac pacemaker. No airspace disease. No displaced fractures or pneumothorax. No pleural effusion.  IMPRESSION: Cardiomegaly without failure.   Electronically Signed   By: Andreas Newport M.D.   On: 09/13/2014 15:41   Dg Pelvis 1-2 Views  09/13/2014   CLINICAL DATA:  Fall today  EXAM: PELVIS - 1-2 VIEW  COMPARISON:  04/26/2009  FINDINGS: Left hip hemiarthroplasty in satisfactory position  and alignment.  Healed fractures of the left acetabulum and left superior and inferior pubic rami.  Negative for acute fracture or dislocation  IMPRESSION: No acute abnormality.   Electronically Signed   By: Marlan Palau M.D.   On: 09/13/2014 15:40   Ct Head Wo Contrast  09/13/2014   CLINICAL DATA:  Larey Seat from wheelchair when chair hit a knee then pavement, no loss of consciousness, frontal puncture wound, LEFT infraorbital laceration, on blood thinners, history CHF, ischemic cardiomyopathy, coronary artery disease, hypertension, type II diabetes mellitus, former smoker  EXAM: CT HEAD WITHOUT CONTRAST  CT MAXILLOFACIAL WITHOUT CONTRAST  CT CERVICAL SPINE WITHOUT CONTRAST  TECHNIQUE: Multidetector CT imaging of the head, cervical spine, and maxillofacial structures were performed using the standard protocol without intravenous contrast. Multiplanar CT image reconstructions of the cervical spine and maxillofacial structures were also generated. RIGHT-side of face marked with a BB.  COMPARISON:  CT head 12/26/2009  FINDINGS: CT HEAD FINDINGS  Generalized atrophy.  Stable ex vacuo dilatation of the ventricular system.  No midline shift or mass effect.  Small vessel chronic ischemic changes of deep cerebral white matter.  Old RIGHT basal ganglia lacunar infarct.  No intracranial hemorrhage, mass lesion, or evidence acute infarction.  No extra-axial fluid collections.  LEFT frontal bone fracture, see below.  CT MAXILLOFACIAL FINDINGS  Large scalp hematoma LEFT frontal extending LEFT periorbital, to LEFT nose, and LEFT cheek.  Nasal septal deviation to the LEFT.  Complete opacification of the RIGHT maxillary sinus with high attenuation question blood.  Partial opacification of sphenoid sinus, ethmoid air cells LEFT greater than RIGHT, LEFT frontal sinus, and LEFT maxillary sinus.  Depressed fracture involving the anterior wall of the LEFT frontal sinus, extending into the LEFT orbital roof with associated LEFT orbital  pneumatosis.  No definite pneumocephalus is seen though a fracture plane at the posterior wall LEFT frontal sinus traverses the LEFT frontal bone.  Single tiny focus of pneumocephalus at the inferior aspect of the RIGHT orbit, likely nondisplaced anterior RIGHT maxillary wall fracture extending into inferior RIGHT orbital wall.  High attenuation within RIGHT maxillary sinus may represent a hematoma.  Remaining orbital walls and facial bones appear grossly intact.  CT CERVICAL SPINE FINDINGS  Osseous demineralization.  Visualized skullbase intact.  Multilevel facet degenerative changes.  Diffuse disc space narrowing with endplate spur formation at C5-C6 and C6-C7.  Vertebral body heights maintained without  fracture or subluxation.  No bone destruction.  Lung apices clear.  Distended esophagus containing food debris and fluid.  Scattered atherosclerotic calcifications.  IMPRESSION: Generalized atrophy with small vessel chronic ischemic changes of deep cerebral white matter.  Tiny old lacunar infarct RIGHT basal ganglia.  No acute intracranial abnormalities.  Large LEFT facial/frontal scalp hematoma.  Depressed fracture involving anterior wall of LEFT frontal sinus extending into LEFT orbital roof.  Nondisplaced fracture planes involving the posterior wall of the LEFT frontal sinus without definite pneumocephalus or intracranial hematoma.  Probable nondisplaced fracture anterior wall RIGHT maxillary sinus extending to the RIGHT inferior orbital wall with minimal in RIGHT orbital pneumatosis.  Probable RIGHT maxillary sinus hematoma.  Multilevel degenerative disc and facet disease changes of the cervical spine.  No acute cervical spine abnormalities.  Findings called to Dr. Romeo Apple on 09/13/2014 at 1653 hours.   Electronically Signed   By: Ulyses Southward M.D.   On: 09/13/2014 16:54   Ct Cervical Spine Wo Contrast  09/13/2014   CLINICAL DATA:  Larey Seat from wheelchair when chair hit a knee then pavement, no loss of  consciousness, frontal puncture wound, LEFT infraorbital laceration, on blood thinners, history CHF, ischemic cardiomyopathy, coronary artery disease, hypertension, type II diabetes mellitus, former smoker  EXAM: CT HEAD WITHOUT CONTRAST  CT MAXILLOFACIAL WITHOUT CONTRAST  CT CERVICAL SPINE WITHOUT CONTRAST  TECHNIQUE: Multidetector CT imaging of the head, cervical spine, and maxillofacial structures were performed using the standard protocol without intravenous contrast. Multiplanar CT image reconstructions of the cervical spine and maxillofacial structures were also generated. RIGHT-side of face marked with a BB.  COMPARISON:  CT head 12/26/2009  FINDINGS: CT HEAD FINDINGS  Generalized atrophy.  Stable ex vacuo dilatation of the ventricular system.  No midline shift or mass effect.  Small vessel chronic ischemic changes of deep cerebral white matter.  Old RIGHT basal ganglia lacunar infarct.  No intracranial hemorrhage, mass lesion, or evidence acute infarction.  No extra-axial fluid collections.  LEFT frontal bone fracture, see below.  CT MAXILLOFACIAL FINDINGS  Large scalp hematoma LEFT frontal extending LEFT periorbital, to LEFT nose, and LEFT cheek.  Nasal septal deviation to the LEFT.  Complete opacification of the RIGHT maxillary sinus with high attenuation question blood.  Partial opacification of sphenoid sinus, ethmoid air cells LEFT greater than RIGHT, LEFT frontal sinus, and LEFT maxillary sinus.  Depressed fracture involving the anterior wall of the LEFT frontal sinus, extending into the LEFT orbital roof with associated LEFT orbital pneumatosis.  No definite pneumocephalus is seen though a fracture plane at the posterior wall LEFT frontal sinus traverses the LEFT frontal bone.  Single tiny focus of pneumocephalus at the inferior aspect of the RIGHT orbit, likely nondisplaced anterior RIGHT maxillary wall fracture extending into inferior RIGHT orbital wall.  High attenuation within RIGHT maxillary  sinus may represent a hematoma.  Remaining orbital walls and facial bones appear grossly intact.  CT CERVICAL SPINE FINDINGS  Osseous demineralization.  Visualized skullbase intact.  Multilevel facet degenerative changes.  Diffuse disc space narrowing with endplate spur formation at C5-C6 and C6-C7.  Vertebral body heights maintained without fracture or subluxation.  No bone destruction.  Lung apices clear.  Distended esophagus containing food debris and fluid.  Scattered atherosclerotic calcifications.  IMPRESSION: Generalized atrophy with small vessel chronic ischemic changes of deep cerebral white matter.  Tiny old lacunar infarct RIGHT basal ganglia.  No acute intracranial abnormalities.  Large LEFT facial/frontal scalp hematoma.  Depressed fracture involving anterior wall of LEFT  frontal sinus extending into LEFT orbital roof.  Nondisplaced fracture planes involving the posterior wall of the LEFT frontal sinus without definite pneumocephalus or intracranial hematoma.  Probable nondisplaced fracture anterior wall RIGHT maxillary sinus extending to the RIGHT inferior orbital wall with minimal in RIGHT orbital pneumatosis.  Probable RIGHT maxillary sinus hematoma.  Multilevel degenerative disc and facet disease changes of the cervical spine.  No acute cervical spine abnormalities.  Findings called to Dr. Romeo Apple on 09/13/2014 at 1653 hours.   Electronically Signed   By: Ulyses Southward M.D.   On: 09/13/2014 16:54   Dg Hand Complete Left  09/13/2014   CLINICAL DATA:  Fall, bilateral hand pain  EXAM: LEFT HAND - COMPLETE 3+ VIEW  COMPARISON:  None.  FINDINGS: No fracture or dislocation is seen.  The joint spaces are preserved.  The visualized soft tissues are unremarkable.  IMPRESSION: No fracture or dislocation is seen.   Electronically Signed   By: Charline Bills M.D.   On: 09/13/2014 15:41   Dg Hand Complete Right  09/13/2014   CLINICAL DATA:  Larey Seat out of wheelchair today, BILATERAL hand pain  EXAM: RIGHT  HAND - COMPLETE 3+ VIEW  COMPARISON:  None  FINDINGS: Osseous mineralization grossly normal for age.  Joint spaces preserved.  Fingers superimposed on lateral view limiting assessment.  No acute fracture, dislocation, or bone destruction.  Small vessel vascular calcification at wrist.  IMPRESSION: No acute osseous abnormalities.   Electronically Signed   By: Ulyses Southward M.D.   On: 09/13/2014 15:41   Ct Maxillofacial Wo Cm  09/13/2014   CLINICAL DATA:  Larey Seat from wheelchair when chair hit a knee then pavement, no loss of consciousness, frontal puncture wound, LEFT infraorbital laceration, on blood thinners, history CHF, ischemic cardiomyopathy, coronary artery disease, hypertension, type II diabetes mellitus, former smoker  EXAM: CT HEAD WITHOUT CONTRAST  CT MAXILLOFACIAL WITHOUT CONTRAST  CT CERVICAL SPINE WITHOUT CONTRAST  TECHNIQUE: Multidetector CT imaging of the head, cervical spine, and maxillofacial structures were performed using the standard protocol without intravenous contrast. Multiplanar CT image reconstructions of the cervical spine and maxillofacial structures were also generated. RIGHT-side of face marked with a BB.  COMPARISON:  CT head 12/26/2009  FINDINGS: CT HEAD FINDINGS  Generalized atrophy.  Stable ex vacuo dilatation of the ventricular system.  No midline shift or mass effect.  Small vessel chronic ischemic changes of deep cerebral white matter.  Old RIGHT basal ganglia lacunar infarct.  No intracranial hemorrhage, mass lesion, or evidence acute infarction.  No extra-axial fluid collections.  LEFT frontal bone fracture, see below.  CT MAXILLOFACIAL FINDINGS  Large scalp hematoma LEFT frontal extending LEFT periorbital, to LEFT nose, and LEFT cheek.  Nasal septal deviation to the LEFT.  Complete opacification of the RIGHT maxillary sinus with high attenuation question blood.  Partial opacification of sphenoid sinus, ethmoid air cells LEFT greater than RIGHT, LEFT frontal sinus, and LEFT  maxillary sinus.  Depressed fracture involving the anterior wall of the LEFT frontal sinus, extending into the LEFT orbital roof with associated LEFT orbital pneumatosis.  No definite pneumocephalus is seen though a fracture plane at the posterior wall LEFT frontal sinus traverses the LEFT frontal bone.  Single tiny focus of pneumocephalus at the inferior aspect of the RIGHT orbit, likely nondisplaced anterior RIGHT maxillary wall fracture extending into inferior RIGHT orbital wall.  High attenuation within RIGHT maxillary sinus may represent a hematoma.  Remaining orbital walls and facial bones appear grossly intact.  CT CERVICAL  SPINE FINDINGS  Osseous demineralization.  Visualized skullbase intact.  Multilevel facet degenerative changes.  Diffuse disc space narrowing with endplate spur formation at C5-C6 and C6-C7.  Vertebral body heights maintained without fracture or subluxation.  No bone destruction.  Lung apices clear.  Distended esophagus containing food debris and fluid.  Scattered atherosclerotic calcifications.  IMPRESSION: Generalized atrophy with small vessel chronic ischemic changes of deep cerebral white matter.  Tiny old lacunar infarct RIGHT basal ganglia.  No acute intracranial abnormalities.  Large LEFT facial/frontal scalp hematoma.  Depressed fracture involving anterior wall of LEFT frontal sinus extending into LEFT orbital roof.  Nondisplaced fracture planes involving the posterior wall of the LEFT frontal sinus without definite pneumocephalus or intracranial hematoma.  Probable nondisplaced fracture anterior wall RIGHT maxillary sinus extending to the RIGHT inferior orbital wall with minimal in RIGHT orbital pneumatosis.  Probable RIGHT maxillary sinus hematoma.  Multilevel degenerative disc and facet disease changes of the cervical spine.  No acute cervical spine abnormalities.  Findings called to Dr. Romeo Apple on 09/13/2014 at 1653 hours.   Electronically Signed   By: Ulyses Southward M.D.   On:  09/13/2014 16:54     EKG Interpretation   Date/Time:  Tuesday September 13 2014 17:51:54 EDT Ventricular Rate:  70 PR Interval:    QRS Duration: 211 QT Interval:  508 QTC Calculation: 548 R Axis:   -161 Text Interpretation:  ventriculared paced rhythm Abnormal ekg artifact  noted Confirmed by Bebe Shaggy  MD, DONALD ([id number]) on 09/13/2014 5:54:43 PM      MDM   Final diagnoses:  Fall  Maxillary sinus fracture, closed, initial encounter  Concussion, without loss of consciousness, initial encounter  Frontal sinus fracture, closed, initial encounter    3:21 PM 81 y.o. male w hx of CHF on digoxin and eliquis, mild aortic stenosis, heart block s/p pacemaker, CAD s/p PCI who presents with a fall from standing prior to arrival. Per EMS report, the patient was on a field trip and fell forward while getting up from his walker. He landed on his face. Staff at the scene deny loss of consciousness although the patient cannot remember. He complains of a mild headache but has no other complaints otherwise. He was actively bleeding from multiple wounds on his face upon arrival which required immediate repair. Vital signs unremarkable. We'll get screening imaging and update tetanus.   Wounds continue to ooze blood after repair on the face. Compression dressings applied.   Pt did have several episodes of hematemesis related to swallowing large amount of blood. No evidence of ongoing nasal bleeding or bleeding in the posterior oropharynx on my exam.   Care transferred to Dr. Bebe Shaggy while awaiting CT imaging.     Purvis Sheffield, MD 09/13/14 2236

## 2014-09-13 NOTE — ED Notes (Signed)
Pt still in radiology.

## 2014-09-13 NOTE — ED Notes (Signed)
Pt's sats 87-88% on room air. Applied 2 L [place]. Pt airway patent. Pt alert and oriented.

## 2014-09-14 DIAGNOSIS — N183 Chronic kidney disease, stage 3 (moderate): Secondary | ICD-10-CM | POA: Diagnosis not present

## 2014-09-14 DIAGNOSIS — I482 Chronic atrial fibrillation: Secondary | ICD-10-CM | POA: Diagnosis not present

## 2014-09-14 DIAGNOSIS — I442 Atrioventricular block, complete: Secondary | ICD-10-CM

## 2014-09-14 DIAGNOSIS — S0292XA Unspecified fracture of facial bones, initial encounter for closed fracture: Secondary | ICD-10-CM | POA: Diagnosis not present

## 2014-09-14 DIAGNOSIS — I5022 Chronic systolic (congestive) heart failure: Secondary | ICD-10-CM | POA: Diagnosis not present

## 2014-09-14 DIAGNOSIS — S0219XA Other fracture of base of skull, initial encounter for closed fracture: Secondary | ICD-10-CM

## 2014-09-14 LAB — BASIC METABOLIC PANEL
Anion gap: 9 (ref 5–15)
BUN: 41 mg/dL — AB (ref 6–20)
CALCIUM: 8.7 mg/dL — AB (ref 8.9–10.3)
CHLORIDE: 101 mmol/L (ref 101–111)
CO2: 29 mmol/L (ref 22–32)
Creatinine, Ser: 1.8 mg/dL — ABNORMAL HIGH (ref 0.61–1.24)
GFR calc Af Amer: 36 mL/min — ABNORMAL LOW (ref >60)
GFR, EST NON AFRICAN AMERICAN: 31 mL/min — AB (ref >60)
GLUCOSE: 250 mg/dL — AB (ref 65–99)
Potassium: 4.3 mmol/L (ref 3.5–5.1)
Sodium: 139 mmol/L (ref 135–145)

## 2014-09-14 LAB — CBC
HCT: 29.9 % — ABNORMAL LOW (ref 39.0–52.0)
Hemoglobin: 10.2 g/dL — ABNORMAL LOW (ref 13.0–17.0)
MCH: 30.8 pg (ref 26.0–34.0)
MCHC: 34.1 g/dL (ref 30.0–36.0)
MCV: 90.3 fL (ref 78.0–100.0)
Platelets: 120 10*3/uL — ABNORMAL LOW (ref 150–400)
RBC: 3.31 MIL/uL — ABNORMAL LOW (ref 4.22–5.81)
RDW: 13.8 % (ref 11.5–15.5)
WBC: 8.8 10*3/uL (ref 4.0–10.5)

## 2014-09-14 LAB — GLUCOSE, CAPILLARY
GLUCOSE-CAPILLARY: 212 mg/dL — AB (ref 65–99)
GLUCOSE-CAPILLARY: 138 mg/dL — AB (ref 65–99)
Glucose-Capillary: 175 mg/dL — ABNORMAL HIGH (ref 65–99)
Glucose-Capillary: 143 mg/dL — ABNORMAL HIGH (ref 65–99)

## 2014-09-14 MED ORDER — INSULIN GLARGINE 100 UNIT/ML ~~LOC~~ SOLN
20.0000 [IU] | Freq: Every day | SUBCUTANEOUS | Status: DC
  Administered 2014-09-14 – 2014-09-15 (×2): 20 [IU] via SUBCUTANEOUS
  Filled 2014-09-14 (×3): qty 0.2

## 2014-09-14 MED ORDER — SODIUM CHLORIDE 0.9 % IV SOLN
3.0000 g | Freq: Three times a day (TID) | INTRAVENOUS | Status: DC
  Administered 2014-09-14 – 2014-09-15 (×5): 3 g via INTRAVENOUS
  Filled 2014-09-14 (×6): qty 3

## 2014-09-14 NOTE — Progress Notes (Signed)
Occupational Therapy Evaluation Patient Details Name: BUREN GRAHMANN MRN: [id number] DOB: 09-Apr-1919 Today's Date: 09/14/2014    History of Present Illness REsident of SNF who sustained Facial trauma s/p fall out of w/c. Facial lacerations repaired in ER. Nondisplaced maxillary and mildly depressed frontal sinus fractures   Clinical Impression   PTA, pt lived at [facility] and was assisted with mobility and ADL by staff as needed. Pt demonstrate a functional decline due to below deficits. Will follow acutely to facilitate D/C back to SNF. Pt may benefit form OT at SNF to return to PLOF.     Follow Up Recommendations  SNF;Supervision/Assistance - 24 hour    Equipment Recommendations  None recommended by OT    Recommendations for Other Services       Precautions / Restrictions Precautions Precautions: Fall      Mobility Bed Mobility Overal bed mobility: Needs Assistance Bed Mobility: Supine to Sit     Supine to sit: Mod assist  Pt assisting with moving legs off bed. Assist to elevate trunk and anteriorly sifting hips forward        Transfers Overall transfer level: Needs assistance Equipment used: 2 person hand held assist Transfers: Sit to/from [employer] Sit to Stand: Min assist;+2 physical assistance Stand pivot transfers: Mod assist;+2 physical assistance  Feel transferred affected by pt's increased difficulty with vision           Balance Overall balance assessment: History of Falls                                          ADL Overall ADL's : Needs assistance/impaired                                     Functional mobility during ADLs: +2 for physical assistance;Moderate assistance - impacted by vision and hearing General ADL Comments: Pt close to baseline level with ADL. Daughter states that pt would complete ADL at w/c level.Pt anxious to begin to eat. Pt able to assist with pericare and simple  grooming. Incontinent, which is his baseline.     Vision  L eye swollen shut. Pt reports he can not see "very well"              Pertinent Vitals/Pain Pain Assessment: Faces Faces Pain Scale: Hurts little more Pain Location: face/head Pain Descriptors / Indicators: Grimacing Pain Intervention(s): Limited activity within patient's tolerance;Monitored during session;Repositioned     Hand Dominance Right   Extremity/Trunk Assessment Upper Extremity Assessment Upper Extremity Assessment: Generalized weakness   Lower Extremity Assessment Lower Extremity Assessment: Defer to PT evaluation   Cervical / Trunk Assessment Cervical / Trunk Assessment: Kyphotic   Communication Communication Communication: HOH   Cognition Arousal/Alertness: Awake/alert Behavior During Therapy: WFL for tasks assessed/performed Overall Cognitive Status: Impaired/Different from baseline Area of Impairment: Safety/judgement;Awareness         Safety/Judgement: Decreased awareness of safety Awareness: Emergent   General Comments: Daughter states pt is more confused than his baeline                      Home Living Family/patient expects to be discharged to:: Skilled nursing facility  Prior Functioning/Environment Level of Independence: Needs assistance  Gait / Transfers Assistance Needed: Pt primarily @ w/c level. Pt staes he transferred self from bed - chair. Staffs assist with ambulation 3 days/week. ADL's / Homemaking Assistance Needed: Staff assists with bathing/dressing but pt also completed on his own Communication / Swallowing Assistance Needed: HOH; normal diet      OT Diagnosis: Generalized weakness;Acute pain;Cognitive deficits   OT Problem List: Decreased strength;Decreased activity tolerance;Decreased cognition;Decreased safety awareness;Pain   OT Treatment/Interventions: Self-care/ADL training;Therapeutic  exercise;DME and/or AE instruction;Therapeutic activities;Patient/family education;Balance training    OT Goals(Current goals can be found in the care plan section) Acute Rehab OT Goals Patient Stated Goal: none stated OT Goal Formulation: With patient/family Time For Goal Achievement: 09/28/14 Potential to Achieve Goals: Good  OT Frequency: Min 2X/week   Barriers to D/C:            Co-evaluation PT/OT/SLP Co-Evaluation/Treatment: Yes     OT goals addressed during session: ADL's and self-care;Other (comment) (mobility)      End of Session Nurse Communication: Mobility status  Activity Tolerance: Patient tolerated treatment well Patient left: in chair;with call bell/phone within reach;with family/visitor present   Time: 0865-7846 OT Time Calculation (min): 28 min Charges:  OT General Charges $OT Visit: 1 Procedure OT Evaluation $Initial OT Evaluation Tier I: 1 Procedure G-Codes: OT G-codes **NOT FOR INPATIENT CLASS** Functional Assessment Tool Used: clinical judgement Functional Limitation: Self care Self Care Current Status (N6295): At least 60 percent but less than 80 percent impaired, limited or restricted Self Care Goal Status (M8413): At least 20 percent but less than 40 percent impaired, limited or restricted  Tamyra Fojtik,HILLARY 09/14/2014, 11:10 AM   [facility], OTR/L  [phone] 09/14/2014

## 2014-09-14 NOTE — Evaluation (Signed)
Physical Therapy Evaluation Patient Details Name: [name] MRN: [id number] DOB: Aug 23, 1919 Today's Date: 09/14/2014   History of Present Illness  REsident of SNF who sustained Facial trauma s/p fall out of w/c. Facial lacerations repaired in ER. Nondisplaced maxillary and mildly depressed frontal sinus fractures  Clinical Impression  Patient demonstrates deficits in functional mobility as indicated below. Will need continued skilled PT to address deficits and maximize function. Will see as indicated and progress as tolerated to facilitate d/c back to SNF.    Follow Up Recommendations SNF;Supervision/Assistance - 24 hour    Equipment Recommendations  None recommended by PT    Recommendations for Other Services       Precautions / Restrictions Precautions Precautions: Fall      Mobility  Bed Mobility Overal bed mobility: Needs Assistance Bed Mobility: Supine to Sit     Supine to sit: Mod assist        Transfers Overall transfer level: Needs assistance Equipment used: 2 person hand held assist Transfers: Sit to/from Stand;Stand Pivot Transfers Sit to Stand: Min assist;+2 physical assistance Stand pivot transfers: Mod assist;+2 physical assistance          Ambulation/Gait                Stairs            Wheelchair Mobility    Modified Rankin (Stroke Patients Only)       Balance Overall balance assessment: History of Falls                                           Pertinent Vitals/Pain Pain Assessment: Faces Faces Pain Scale: Hurts little more Pain Location: face/head Pain Descriptors / Indicators: Grimacing Pain Intervention(s): Limited activity within patient's tolerance;Monitored during session;Repositioned    Home Living Family/patient expects to be discharged to:: Skilled nursing facility                      Prior Function Level of Independence: Needs assistance   Gait / Transfers Assistance  Needed: Pt primarily @ w/c level. Pt staes he transferred self from bed - chair. Staffs assist with ambulation 3 days/week.  ADL's / Homemaking Assistance Needed: Staff assists with bathing/dressing but pt also completed on his own        Hand Dominance   Dominant Hand: Right    Extremity/Trunk Assessment   Upper Extremity Assessment: Generalized weakness           Lower Extremity Assessment: Defer to PT evaluation      Cervical / Trunk Assessment: Kyphotic  Communication   Communication: HOH  Cognition Arousal/Alertness: Awake/alert Behavior During Therapy: WFL for tasks assessed/performed Overall Cognitive Status: Impaired/Different from baseline Area of Impairment: Safety/judgement;Awareness         Safety/Judgement: Decreased awareness of safety Awareness: Emergent   General Comments: Daughter states pt is more confused than his baeline    General Comments      Exercises        Assessment/Plan    PT Assessment Patient needs continued PT services  PT Diagnosis Difficulty walking;Generalized weakness;Acute pain;Altered mental status   PT Problem List Decreased strength;Decreased range of motion;Decreased activity tolerance;Decreased balance;Decreased mobility;Decreased coordination;Decreased cognition;Decreased safety awareness;Pain  PT Treatment Interventions DME instruction;Functional mobility training;Therapeutic activities;Therapeutic exercise;Balance training;Cognitive remediation;Patient/family education   PT Goals (Current goals can be found in the Care Plan  section) Acute Rehab PT Goals Patient Stated Goal: none stated PT Goal Formulation: With patient/family Time For Goal Achievement: 09/28/14 Potential to Achieve Goals: Good    Frequency Min 2X/week   Barriers to discharge        Co-evaluation PT/OT/SLP Co-Evaluation/Treatment: Yes Reason for Co-Treatment: Complexity of the patient's impairments (multi-system involvement);Necessary to  address cognition/behavior during functional activity PT goals addressed during session: Mobility/safety with mobility OT goals addressed during session: ADL's and self-care;Other (comment) (mobility)       End of Session Equipment Utilized During Treatment: Gait belt;Oxygen Activity Tolerance: Patient limited by fatigue;Other (comment) (SLP to perform assessment) Patient left: in chair;with call bell/phone within reach;with family/visitor present;Other (comment) (with SLP) Nurse Communication: Mobility status    Functional Assessment Tool Used: clinical judgement Functional Limitation: Mobility: Walking and moving around Mobility: Walking and Moving Around Current Status (Z6109): At least 40 percent but less than 60 percent impaired, limited or restricted Mobility: Walking and Moving Around Goal Status [phone]): At least 1 percent but less than 20 percent impaired, limited or restricted    Time: 1024-1052 PT Time Calculation (min) (ACUTE ONLY): 28 min   Charges:   PT Evaluation $Initial PT Evaluation Tier I: 1 Procedure     PT G Codes:   PT G-Codes **NOT FOR INPATIENT CLASS** Functional Assessment Tool Used: clinical judgement Functional Limitation: Mobility: Walking and moving around Mobility: Walking and Moving Around Current Status (U9811): At least 40 percent but less than 60 percent impaired, limited or restricted Mobility: Walking and Moving Around Goal Status [phone]): At least 1 percent but less than 20 percent impaired, limited or restricted    Fabio Asa 09/14/2014, 1:57 PM Charlotte Crumb, PT DPT  [phone]

## 2014-09-14 NOTE — Clinical Social Work Note (Signed)
CSW contacted patient's daughter [name] to discuss patient discharging back to [facility].  Patient's daughter stated he has been there since 2009 and they would like him to return once he is ready for discharge.  CSW spoke to SNF admissions at [facility] and she said they can take patient back.  CSW will continue to follow patient throughout discharge planning.  Ervin Knack. Anzel Kearse, MSW, Theresia Majors [phone] 09/14/2014 5:25 PM

## 2014-09-14 NOTE — Progress Notes (Signed)
OT Cancellation Note  Patient Details Name: [name] MRN: [id number] DOB: [birth year]   Cancelled Treatment:    Reason Eval/Treat Not Completed: Other (comment) Pt on bedrest. [facility] Judaea Burgoon, OTR/L  [phone] 09/14/2014 09/14/2014, 10:08 AM

## 2014-09-14 NOTE — Progress Notes (Signed)
Pt placed on Aerosol face tent for facial trauma & epistaxis.

## 2014-09-14 NOTE — Progress Notes (Signed)
PT Cancellation Note  Patient Details Name: [name] MRN: [id number] DOB: 23-May-1919   Cancelled Treatment:    Reason Eval/Treat Not Completed: Patient not medically ready, active bedrest orders at this time   Fabio Asa 09/14/2014, 10:18 AM Charlotte Crumb, PT DPT  [phone]

## 2014-09-14 NOTE — Progress Notes (Signed)
SLP Cancellation Note  Patient Details Name: JONATHANMICHAEL PASOS MRN: [id number] DOB: 08/24/1919   Cancelled treatment:       Reason Eval/Treat Not Completed: Medical issues which prohibited therapy - pt with bout of emesis prior to SLP arrival. MD notified by RN - will hold swallow evaluation at this time. RN to page SLP when pt is able to participate.   Maxcine Ham, M.A. CCC-SLP [phone]  Maxcine Ham 09/14/2014, 9:54 AM

## 2014-09-14 NOTE — Progress Notes (Signed)
MD Mikhail made aware of pt vomiting and that what appears to be a blood clot came up.  Plavix held per MD.  Will continue to monitor pt closely. Sherald Barge

## 2014-09-14 NOTE — Progress Notes (Signed)
[facility]                                                                              Patient Demographics  [name], is a 81 y.o. male, DOB - March 15, 1920, KGS:[id number]  Admit date - 09/13/2014   Admitting Physician Edsel Petrin, DO  Outpatient Primary MD for the patient is Oneal Grout, MD  LOS -    Chief Complaint  Patient presents with  . Fall      HPI on 09/13/2014 [name] is a 81 y.o. male with a history of atrial fibrillation, chronic systolic heart failure, complete heart block status post pacemaker, dementia, presented to the emergency department after sustaining a fall at the nursing facility. It seems that patient's wheelchair had "gotten stuck", and patient fell out of the wheelchair hitting the pavement. Upon arrival to the emergency department, patient was noted to have facial swelling. CT of the face showed maxillary fracture. EDP call trauma surgery as well as ENT. ENT will see patient in the morning. TRH was called for admission for pain management.  Assessment & Plan  Facial fracture due to mechanical fall -CT maxillofacial/CT head showed no acute intracranial abnormalities, old lacunar infarct, left frontal scalp and facial hematoma. Right maxillary sinus hematoma, nondisplaced fracture posterior wall of the left frontal sinus. -EDP spoke with Dr. Suszanne Conners and Dr. Renaldo Fiddler who will see the patient. No acute surgical intervention.  ENT, Dr. Suszanne Conners, consulted and appreciated.  Will follow up with patient in 1week.  Did not recommended continuing antibiotics at discharge.  No surgical intervention at this time.  -Continue pain control  -Continue unasyn empirically -Speech recommended dysphagia 3 diet -PT consult pending -OT recommended SNF  Chronic normocytic anemia -baseline Hb appears to be between 10-11 -Currently 10.2 -Given recent fall, will continue to monitor CBC closely  History of complete heart block -Patient has pacemaker in  place  Hypokalemia -Like a secondary diuretics -Will supplement and continue to monitor  Chronic kidney disease, stage 3 -Creatinine appears to be at baseline -Continue to monitor BMP  Dementia -Mild, not on any medication  Atrial fibrillation -Patient takes Eliquis -Due to fall and current trauma will hold -Given patient's age and fall risk, would consider stopping medication, discussed with daughter.   -discussed with ENT, recommended resumption if necessary in 3-4 days. -Continue digoxin and Coreg  Chronic systolic heart failure/ischemic cardiomyopathy  -Continue diuretics  -Monitor intake and output, daily weights  -Echocardiogram from 2010 shows an EF of 40-45%   Diabetes mellitus -Continue Lantus, sliding scale, CBG monitoring.  Hypothyroidism -Continue Synthroid  Hyperlipidemia -Continue statin  GERD -Continue Protonix  Insomnia -Continue temazepam  Code Status: Full  Family Communication: None at bedside.  Daughter via phone.  Disposition Plan: Admitted. Continue to monitor.  SNF likely 6/23.  Time Spent in minutes   30 minutes  Procedures  None  Consults   ENT, Dr. Suszanne Conners  DVT Prophylaxis  SCDs  Lab Results  Component Value Date   PLT 120* 09/14/2014    Medications  Scheduled Meds: . ampicillin-sulbactam (UNASYN) IV  3 g Intravenous Q8H  . carvedilol  3.125 mg Oral BID WC  . digoxin  0.125 mg Oral Daily  . docusate sodium  100 mg Oral BID  . furosemide  20 mg Oral BID  . insulin aspart  0-15 Units Subcutaneous TID WC  . insulin glargine  20 Units Subcutaneous QHS  . insulin glargine  40 Units Subcutaneous Daily  . levothyroxine  100 mcg Oral QAC breakfast  . loratadine  10 mg Oral Daily  . metolazone  2.5 mg Oral Daily  . pantoprazole  20 mg Oral Daily  . sertraline  75 mg Oral Daily  . simvastatin  20 mg Oral q1800  . sodium chloride  3 mL Intravenous Q12H   Continuous Infusions:  PRN Meds:.sodium chloride, acetaminophen  **OR** acetaminophen, ALPRAZolam, morphine injection, ondansetron **OR** ondansetron (ZOFRAN) IV, sodium chloride, temazepam  Antibiotics    Anti-infectives    Start     Dose/Rate Route Frequency Ordered Stop   09/14/14 0200  Ampicillin-Sulbactam (UNASYN) 3 g in sodium chloride 0.9 % 100 mL IVPB     3 g 100 mL/hr over 60 Minutes Intravenous Every 8 hours 09/14/14 0004     09/13/14 1800  ceFAZolin (ANCEF) IVPB 1 g/50 mL premix  Status:  Discontinued     1 g 100 mL/hr over 30 Minutes Intravenous  Once 09/13/14 1749 09/13/14 1750   09/13/14 1800  Ampicillin-Sulbactam (UNASYN) 3 g in sodium chloride 0.9 % 100 mL IVPB     3 g 100 mL/hr over 60 Minutes Intravenous  Once 09/13/14 1750 09/13/14 1927      Subjective:   [name] seen and examined today. Patient has complaints of facial pain, and is unable to open his left eye.  Denies chest pain, shortness of breath, abdominal pain.   Objective:   Filed Vitals:   09/14/14 1610 09/14/14 0750 09/14/14 0958 09/14/14 1112  BP: 106/43 104/43  110/45  Pulse: 70 70 70 72  Temp: 98.4 F (36.9 C)   97.9 F (36.6 C)  TempSrc: Oral   Oral  Resp: 18     Height:      Weight: 92.171 kg (203 lb 3.2 oz)     SpO2: 92%   98%    Wt Readings from Last 3 Encounters:  09/14/14 92.171 kg (203 lb 3.2 oz)  09/07/14 95.255 kg (210 lb)  08/11/14 90.266 kg (199 lb)     Intake/Output Summary (Last 24 hours) at 09/14/14 1344 Last data filed at 09/14/14 0800  Gross per 24 hour  Intake    350 ml  Output      0 ml  Net    350 ml    Exam  General: Well developed, well nourished, NAD, appears stated age  HEENT: [place], Left frontal hematoma, ecchymosis, multiple abrasions of the face, mild edema and bruising of the nasal bridge, moderate bruising and left-sided. Orbital swelling. Difficult to examine left eye due to swelling and bruising. mucous membranes moist.   Cardiovascular: S1 S2 auscultated, 2/6 SEM, regular  Respiratory: Clear to auscultation  bilaterally with equal chest rise  Abdomen: Soft, nontender, nondistended, + bowel sounds  Extremities: warm dry without cyanosis clubbing. Mild LE edema B/L.  Neuro: AAOx2, Difficult to examine left eye due to hematoma and swelling, otherwise, CN intact, strength normal.  Skin: Multiple abrasions and bruising noted. No rashes.  Psych: Normal affect and demeanor, pleasant    Data Review   Micro Results No results found for this or any previous visit (from the past 240 hour(s)).  Radiology Reports Dg Chest 1 View  09/13/2014  CLINICAL DATA:  Fall today.  Fall from wheelchair.  EXAM: CHEST  1 VIEW  COMPARISON:  04/29/2009.  FINDINGS: Cardiomegaly is present. There is no failure. Two lead LEFT subclavian cardiac pacemaker. No airspace disease. No displaced fractures or pneumothorax. No pleural effusion.  IMPRESSION: Cardiomegaly without failure.   Electronically Signed   By: Andreas Newport M.D.   On: 09/13/2014 15:41   Dg Pelvis 1-2 Views  09/13/2014   CLINICAL DATA:  Fall today  EXAM: PELVIS - 1-2 VIEW  COMPARISON:  04/26/2009  FINDINGS: Left hip hemiarthroplasty in satisfactory position and alignment.  Healed fractures of the left acetabulum and left superior and inferior pubic rami.  Negative for acute fracture or dislocation  IMPRESSION: No acute abnormality.   Electronically Signed   By: Marlan Palau M.D.   On: 09/13/2014 15:40   Ct Head Wo Contrast  09/13/2014   CLINICAL DATA:  Larey Seat from wheelchair when chair hit a knee then pavement, no loss of consciousness, frontal puncture wound, LEFT infraorbital laceration, on blood thinners, history CHF, ischemic cardiomyopathy, coronary artery disease, hypertension, type II diabetes mellitus, former smoker  EXAM: CT HEAD WITHOUT CONTRAST  CT MAXILLOFACIAL WITHOUT CONTRAST  CT CERVICAL SPINE WITHOUT CONTRAST  TECHNIQUE: Multidetector CT imaging of the head, cervical spine, and maxillofacial structures were performed using the standard  protocol without intravenous contrast. Multiplanar CT image reconstructions of the cervical spine and maxillofacial structures were also generated. RIGHT-side of face marked with a BB.  COMPARISON:  CT head 12/26/2009  FINDINGS: CT HEAD FINDINGS  Generalized atrophy.  Stable ex vacuo dilatation of the ventricular system.  No midline shift or mass effect.  Small vessel chronic ischemic changes of deep cerebral white matter.  Old RIGHT basal ganglia lacunar infarct.  No intracranial hemorrhage, mass lesion, or evidence acute infarction.  No extra-axial fluid collections.  LEFT frontal bone fracture, see below.  CT MAXILLOFACIAL FINDINGS  Large scalp hematoma LEFT frontal extending LEFT periorbital, to LEFT nose, and LEFT cheek.  Nasal septal deviation to the LEFT.  Complete opacification of the RIGHT maxillary sinus with high attenuation question blood.  Partial opacification of sphenoid sinus, ethmoid air cells LEFT greater than RIGHT, LEFT frontal sinus, and LEFT maxillary sinus.  Depressed fracture involving the anterior wall of the LEFT frontal sinus, extending into the LEFT orbital roof with associated LEFT orbital pneumatosis.  No definite pneumocephalus is seen though a fracture plane at the posterior wall LEFT frontal sinus traverses the LEFT frontal bone.  Single tiny focus of pneumocephalus at the inferior aspect of the RIGHT orbit, likely nondisplaced anterior RIGHT maxillary wall fracture extending into inferior RIGHT orbital wall.  High attenuation within RIGHT maxillary sinus may represent a hematoma.  Remaining orbital walls and facial bones appear grossly intact.  CT CERVICAL SPINE FINDINGS  Osseous demineralization.  Visualized skullbase intact.  Multilevel facet degenerative changes.  Diffuse disc space narrowing with endplate spur formation at C5-C6 and C6-C7.  Vertebral body heights maintained without fracture or subluxation.  No bone destruction.  Lung apices clear.  Distended esophagus containing  food debris and fluid.  Scattered atherosclerotic calcifications.  IMPRESSION: Generalized atrophy with small vessel chronic ischemic changes of deep cerebral white matter.  Tiny old lacunar infarct RIGHT basal ganglia.  No acute intracranial abnormalities.  Large LEFT facial/frontal scalp hematoma.  Depressed fracture involving anterior wall of LEFT frontal sinus extending into LEFT orbital roof.  Nondisplaced fracture planes involving the posterior wall of the LEFT frontal sinus without definite  pneumocephalus or intracranial hematoma.  Probable nondisplaced fracture anterior wall RIGHT maxillary sinus extending to the RIGHT inferior orbital wall with minimal in RIGHT orbital pneumatosis.  Probable RIGHT maxillary sinus hematoma.  Multilevel degenerative disc and facet disease changes of the cervical spine.  No acute cervical spine abnormalities.  Findings called to Dr. Romeo Apple on 09/13/2014 at 1653 hours.   Electronically Signed   By: Ulyses Southward M.D.   On: 09/13/2014 16:54   Ct Cervical Spine Wo Contrast  09/13/2014   CLINICAL DATA:  Larey Seat from wheelchair when chair hit a knee then pavement, no loss of consciousness, frontal puncture wound, LEFT infraorbital laceration, on blood thinners, history CHF, ischemic cardiomyopathy, coronary artery disease, hypertension, type II diabetes mellitus, former smoker  EXAM: CT HEAD WITHOUT CONTRAST  CT MAXILLOFACIAL WITHOUT CONTRAST  CT CERVICAL SPINE WITHOUT CONTRAST  TECHNIQUE: Multidetector CT imaging of the head, cervical spine, and maxillofacial structures were performed using the standard protocol without intravenous contrast. Multiplanar CT image reconstructions of the cervical spine and maxillofacial structures were also generated. RIGHT-side of face marked with a BB.  COMPARISON:  CT head 12/26/2009  FINDINGS: CT HEAD FINDINGS  Generalized atrophy.  Stable ex vacuo dilatation of the ventricular system.  No midline shift or mass effect.  Small vessel chronic  ischemic changes of deep cerebral white matter.  Old RIGHT basal ganglia lacunar infarct.  No intracranial hemorrhage, mass lesion, or evidence acute infarction.  No extra-axial fluid collections.  LEFT frontal bone fracture, see below.  CT MAXILLOFACIAL FINDINGS  Large scalp hematoma LEFT frontal extending LEFT periorbital, to LEFT nose, and LEFT cheek.  Nasal septal deviation to the LEFT.  Complete opacification of the RIGHT maxillary sinus with high attenuation question blood.  Partial opacification of sphenoid sinus, ethmoid air cells LEFT greater than RIGHT, LEFT frontal sinus, and LEFT maxillary sinus.  Depressed fracture involving the anterior wall of the LEFT frontal sinus, extending into the LEFT orbital roof with associated LEFT orbital pneumatosis.  No definite pneumocephalus is seen though a fracture plane at the posterior wall LEFT frontal sinus traverses the LEFT frontal bone.  Single tiny focus of pneumocephalus at the inferior aspect of the RIGHT orbit, likely nondisplaced anterior RIGHT maxillary wall fracture extending into inferior RIGHT orbital wall.  High attenuation within RIGHT maxillary sinus may represent a hematoma.  Remaining orbital walls and facial bones appear grossly intact.  CT CERVICAL SPINE FINDINGS  Osseous demineralization.  Visualized skullbase intact.  Multilevel facet degenerative changes.  Diffuse disc space narrowing with endplate spur formation at C5-C6 and C6-C7.  Vertebral body heights maintained without fracture or subluxation.  No bone destruction.  Lung apices clear.  Distended esophagus containing food debris and fluid.  Scattered atherosclerotic calcifications.  IMPRESSION: Generalized atrophy with small vessel chronic ischemic changes of deep cerebral white matter.  Tiny old lacunar infarct RIGHT basal ganglia.  No acute intracranial abnormalities.  Large LEFT facial/frontal scalp hematoma.  Depressed fracture involving anterior wall of LEFT frontal sinus extending  into LEFT orbital roof.  Nondisplaced fracture planes involving the posterior wall of the LEFT frontal sinus without definite pneumocephalus or intracranial hematoma.  Probable nondisplaced fracture anterior wall RIGHT maxillary sinus extending to the RIGHT inferior orbital wall with minimal in RIGHT orbital pneumatosis.  Probable RIGHT maxillary sinus hematoma.  Multilevel degenerative disc and facet disease changes of the cervical spine.  No acute cervical spine abnormalities.  Findings called to Dr. Romeo Apple on 09/13/2014 at 1653 hours.   Electronically Signed  By: Ulyses Southward M.D.   On: 09/13/2014 16:54   Dg Hand Complete Left  09/13/2014   CLINICAL DATA:  Fall, bilateral hand pain  EXAM: LEFT HAND - COMPLETE 3+ VIEW  COMPARISON:  None.  FINDINGS: No fracture or dislocation is seen.  The joint spaces are preserved.  The visualized soft tissues are unremarkable.  IMPRESSION: No fracture or dislocation is seen.   Electronically Signed   By: Charline Bills M.D.   On: 09/13/2014 15:41   Dg Hand Complete Right  09/13/2014   CLINICAL DATA:  Larey Seat out of wheelchair today, BILATERAL hand pain  EXAM: RIGHT HAND - COMPLETE 3+ VIEW  COMPARISON:  None  FINDINGS: Osseous mineralization grossly normal for age.  Joint spaces preserved.  Fingers superimposed on lateral view limiting assessment.  No acute fracture, dislocation, or bone destruction.  Small vessel vascular calcification at wrist.  IMPRESSION: No acute osseous abnormalities.   Electronically Signed   By: Ulyses Southward M.D.   On: 09/13/2014 15:41   Ct Maxillofacial Wo Cm  09/13/2014   CLINICAL DATA:  Larey Seat from wheelchair when chair hit a knee then pavement, no loss of consciousness, frontal puncture wound, LEFT infraorbital laceration, on blood thinners, history CHF, ischemic cardiomyopathy, coronary artery disease, hypertension, type II diabetes mellitus, former smoker  EXAM: CT HEAD WITHOUT CONTRAST  CT MAXILLOFACIAL WITHOUT CONTRAST  CT CERVICAL SPINE  WITHOUT CONTRAST  TECHNIQUE: Multidetector CT imaging of the head, cervical spine, and maxillofacial structures were performed using the standard protocol without intravenous contrast. Multiplanar CT image reconstructions of the cervical spine and maxillofacial structures were also generated. RIGHT-side of face marked with a BB.  COMPARISON:  CT head 12/26/2009  FINDINGS: CT HEAD FINDINGS  Generalized atrophy.  Stable ex vacuo dilatation of the ventricular system.  No midline shift or mass effect.  Small vessel chronic ischemic changes of deep cerebral white matter.  Old RIGHT basal ganglia lacunar infarct.  No intracranial hemorrhage, mass lesion, or evidence acute infarction.  No extra-axial fluid collections.  LEFT frontal bone fracture, see below.  CT MAXILLOFACIAL FINDINGS  Large scalp hematoma LEFT frontal extending LEFT periorbital, to LEFT nose, and LEFT cheek.  Nasal septal deviation to the LEFT.  Complete opacification of the RIGHT maxillary sinus with high attenuation question blood.  Partial opacification of sphenoid sinus, ethmoid air cells LEFT greater than RIGHT, LEFT frontal sinus, and LEFT maxillary sinus.  Depressed fracture involving the anterior wall of the LEFT frontal sinus, extending into the LEFT orbital roof with associated LEFT orbital pneumatosis.  No definite pneumocephalus is seen though a fracture plane at the posterior wall LEFT frontal sinus traverses the LEFT frontal bone.  Single tiny focus of pneumocephalus at the inferior aspect of the RIGHT orbit, likely nondisplaced anterior RIGHT maxillary wall fracture extending into inferior RIGHT orbital wall.  High attenuation within RIGHT maxillary sinus may represent a hematoma.  Remaining orbital walls and facial bones appear grossly intact.  CT CERVICAL SPINE FINDINGS  Osseous demineralization.  Visualized skullbase intact.  Multilevel facet degenerative changes.  Diffuse disc space narrowing with endplate spur formation at C5-C6 and  C6-C7.  Vertebral body heights maintained without fracture or subluxation.  No bone destruction.  Lung apices clear.  Distended esophagus containing food debris and fluid.  Scattered atherosclerotic calcifications.  IMPRESSION: Generalized atrophy with small vessel chronic ischemic changes of deep cerebral white matter.  Tiny old lacunar infarct RIGHT basal ganglia.  No acute intracranial abnormalities.  Large LEFT facial/frontal scalp  hematoma.  Depressed fracture involving anterior wall of LEFT frontal sinus extending into LEFT orbital roof.  Nondisplaced fracture planes involving the posterior wall of the LEFT frontal sinus without definite pneumocephalus or intracranial hematoma.  Probable nondisplaced fracture anterior wall RIGHT maxillary sinus extending to the RIGHT inferior orbital wall with minimal in RIGHT orbital pneumatosis.  Probable RIGHT maxillary sinus hematoma.  Multilevel degenerative disc and facet disease changes of the cervical spine.  No acute cervical spine abnormalities.  Findings called to Dr. Romeo Apple on 09/13/2014 at 1653 hours.   Electronically Signed   By: Ulyses Southward M.D.   On: 09/13/2014 16:54    CBC  Recent Labs Lab 09/13/14 1404 09/14/14 0409  WBC 9.2 8.8  HGB 12.2* 10.2*  HCT 35.9* 29.9*  PLT 141* 120*  MCV 89.5 90.3  MCH 30.4 30.8  MCHC 34.0 34.1  RDW 13.6 13.8  LYMPHSABS 1.8  --   MONOABS 0.7  --   EOSABS 0.8*  --   BASOSABS 0.0  --     Chemistries   Recent Labs Lab 09/13/14 1404 09/14/14 0409  NA 136 139  K 3.3* 4.3  CL 102 101  CO2 24 29  GLUCOSE 337* 250*  BUN 35* 41*  CREATININE 1.58* 1.80*  CALCIUM 9.1 8.7*   ------------------------------------------------------------------------------------------------------------------ estimated creatinine clearance is 28.7 mL/min (by C-G formula based on Cr of 1.8). ------------------------------------------------------------------------------------------------------------------ No results for  input(s): HGBA1C in the last 72 hours. ------------------------------------------------------------------------------------------------------------------ No results for input(s): CHOL, HDL, LDLCALC, TRIG, CHOLHDL, LDLDIRECT in the last 72 hours. ------------------------------------------------------------------------------------------------------------------ No results for input(s): TSH, T4TOTAL, T3FREE, THYROIDAB in the last 72 hours.  Invalid input(s): FREET3 ------------------------------------------------------------------------------------------------------------------ No results for input(s): VITAMINB12, FOLATE, FERRITIN, TIBC, IRON, RETICCTPCT in the last 72 hours.  Coagulation profile  Recent Labs Lab 09/13/14 1404  INR 1.24    No results for input(s): DDIMER in the last 72 hours.  Cardiac Enzymes No results for input(s): CKMB, TROPONINI, MYOGLOBIN in the last 168 hours.  Invalid input(s): CK ------------------------------------------------------------------------------------------------------------------ Invalid input(s): POCBNP    Aleea Hendry D.O. on 09/14/2014 at 1:44 PM  Between 7am to 7pm - Pager - [phone]  After 7pm go to [email or web address] - password [email or web address]  And look for the night coverage person covering for me after hours  [facility] Office  [phone]

## 2014-09-14 NOTE — Consult Note (Signed)
Reason for Consult: Facial trauma  HPI:  [name] is an 81 y.o. male who was admitted to [facility] yesterday after a fall.  The patient has a history of atrial fibrillation, chronic systolic heart failure, complete heart block status post pacemaker, dementia, presented to the emergency department after sustaining a fall at his nursing facility. It seems that patient's wheelchair had "gotten stuck", and patient fell out of the wheelchair hitting the pavement. Upon arrival to the emergency department, patient was noted to have facial swelling. CT of the face showed frontal and maxillary fractures.   Past Medical History  Diagnosis Date  . Altered mental status     felt to be secondary to acute delirium on top of a suspected underlying dementia  . Acute renal failure     from dehydration brought on by fall  . Pelvic fracture     a. s/p fall  . Depression   . Chronic systolic and diastolic heart failure   . Ischemic cardiomyopathy   . Coronary artery disease     a. s/p PTCA of LAD 2004  . Hypertension   . Obesity   . Hypothyroidism   . B12 deficiency   . Mild aortic stenosis   . Dementia     Questionable dementia  . Complete heart block     status post dual-chamber pacemaker  . Presence of permanent cardiac pacemaker     Past Surgical History  Procedure Laterality Date  . Cholecystectomy    . Hemiarthroplasty hip Left Aug 07, 2004  . Circumcision  June 21, 2005  . Pacemaker generator change N/A 01/27/2013    Procedure: PACEMAKER GENERATOR CHANGE;  Surgeon: Coralyn Mark, MD;  Location: [facility] CATH LAB;  Service: Cardiovascular;  Laterality: N/A;  . Insert / replace / remove pacemaker  2004; 01/2013    MDT pacemaker implanted 2004; gen change by Dr Rayann Heman to MDT [id number] 01/2013  . Fracture surgery      No family history on file.  Social History:  reports that he has quit smoking. His smoking use included Cigarettes. He has a 54 pack-year smoking history. He has quit using  smokeless tobacco. He reports that he does not drink alcohol or use illicit drugs.  Allergies:  Allergies  Allergen Reactions  . Ativan [Lorazepam] Other (See Comments)    unknown  . Codeine Other (See Comments)    unknown  . Promethazine Hcl Other (See Comments)    unknown    Prior to Admission medications   Medication Sig Start Date End Date Taking? Authorizing Provider  acetaminophen (TYLENOL) 325 MG tablet Take 650 mg by mouth 2 (two) times daily. For osteoarthritic pain   Yes Historical Provider, MD  ALPRAZolam Duanne Moron) 0.5 MG tablet Take one tablet by mouth every night at bedtime for anxiety/insomnia; Take one tablet by mouth once daily as needed for anxiety Patient taking differently: Take 0.5 mg by mouth See admin instructions. Takes 1 tab twice daily as needed for anxiety and takes 1 tab every night for sleep 01/27/14  Yes Sibley, DO  AMBULATORY NON FORMULARY MEDICATION Medication Name: Med Pass 240 mL of fluid by mouth three times daily   Yes Historical Provider, MD  apixaban (ELIQUIS) 2.5 MG TABS tablet Take 2.5 mg by mouth 2 (two) times daily.   Yes Historical Provider, MD  carvedilol (COREG) 3.125 MG tablet Take 3.125 mg by mouth 2 (two) times daily with a meal. For HTN   Yes Historical Provider,  MD  cholecalciferol (VITAMIN D) 1000 UNITS tablet Take 1,000 Units by mouth daily.   Yes Historical Provider, MD  clopidogrel (PLAVIX) 75 MG tablet Take 75 mg by mouth daily.     Yes Historical Provider, MD  Cyanocobalamin (VITAMIN B-12 IJ) Inject 1,000 mcg as directed every 30 (thirty) days. On the 26th of every month   Yes Historical Provider, MD  digoxin (LANOXIN) 0.125 MG tablet Take 0.125 mg by mouth daily.   Yes Historical Provider, MD  docusate sodium (COLACE) 100 MG capsule Take 100 mg by mouth 2 (two) times daily. For constipation   Yes Historical Provider, MD  furosemide (LASIX) 20 MG tablet Take 40 mg by mouth daily.    Yes Historical Provider, MD  insulin glargine  (LANTUS) 100 UNIT/ML injection Inject 35 Units into the skin 2 (two) times daily.    Yes Historical Provider, MD  insulin lispro (HUMALOG) 100 UNIT/ML injection Inject 5 Units into the skin 3 (three) times daily as needed for high blood sugar (for BS> 200 dl/ml). 5 units subcutaneously three times daily with meals for BS > 200   Yes Historical Provider, MD  lactulose, encephalopathy, (CHRONULAC) 10 GM/15ML SOLN Take 10 g by mouth daily. Take 15 ml by mouth daily for constipation   Yes Historical Provider, MD  levothyroxine (SYNTHROID, LEVOTHROID) 100 MCG tablet Take 100 mcg by mouth daily.     Yes Historical Provider, MD  loratadine (CLARITIN) 10 MG tablet Take 10 mg by mouth daily as needed for allergies or rhinitis.    Yes Historical Provider, MD  Menthol, Topical Analgesic, 4 % GEL Apply 1 application topically 2 (two) times daily. Apply to posterior neck twice daily for chronic pain   Yes Historical Provider, MD  metolazone (ZAROXOLYN) 2.5 MG tablet Take 2.5 mg by mouth as needed (for weight gain). Takes 1 tablet every morning for weight > 200lbs.  Do not give within 1 hour of giving LASIX.   Yes Historical Provider, MD  Multiple Vitamins-Minerals (CERTA-VITE PO) Take 1 tablet by mouth daily. Take 1 tablet by mouth daily   Yes Historical Provider, MD  neomycin-polymyxin-pramoxine (NEOSPORIN PLUS) 1 % cream Apply 1 application topically 2 (two) times daily. For right FT/Toe   Yes Historical Provider, MD  pantoprazole (PROTONIX) 40 MG tablet Take 20 mg by mouth daily.    Yes Historical Provider, MD  Protein (PROCEL) POWD Take 1 scoop by mouth 2 (two) times daily. Administer 1 scoop by mouth twice daily for supplement   Yes Historical Provider, MD  sertraline (ZOLOFT) 25 MG tablet Take 75 mg by mouth daily. For depression   Yes Historical Provider, MD  simvastatin (ZOCOR) 20 MG tablet Take 20 mg by mouth daily at 6 PM. Take 1 tablet by mouth every evening for Hyperlipidemia   Yes Historical Provider, MD   temazepam (RESTORIL) 7.5 MG capsule Take one capsule by mouth every night at bedtime for insomnia 05/26/14  Yes Lauree Chandler, NP    Medications:  I have reviewed the patient's current medications. Scheduled: . ampicillin-sulbactam (UNASYN) IV  3 g Intravenous Q8H  . carvedilol  3.125 mg Oral BID WC  . clopidogrel  75 mg Oral Daily  . digoxin  0.125 mg Oral Daily  . docusate sodium  100 mg Oral BID  . furosemide  20 mg Oral BID  . insulin aspart  0-15 Units Subcutaneous TID WC  . insulin glargine  10 Units Subcutaneous QHS  . insulin glargine  40 Units  Subcutaneous Daily  . levothyroxine  100 mcg Oral QAC breakfast  . loratadine  10 mg Oral Daily  . metolazone  2.5 mg Oral Daily  . pantoprazole  20 mg Oral Daily  . sertraline  75 mg Oral Daily  . simvastatin  20 mg Oral q1800  . sodium chloride  3 mL Intravenous Q12H   TTS:VXBLTJ chloride, acetaminophen **OR** acetaminophen, ALPRAZolam, morphine injection, ondansetron **OR** ondansetron (ZOFRAN) IV, sodium chloride, temazepam  Results for orders placed or performed during the hospital encounter of 09/13/14 (from the past 48 hour(s))  CBC with Differential     Status: Abnormal   Collection Time: 09/13/14  2:04 PM  Result Value Ref Range   WBC 9.2 4.0 - 10.5 K/uL   RBC 4.01 (L) 4.22 - 5.81 MIL/uL   Hemoglobin 12.2 (L) 13.0 - 17.0 g/dL   HCT 35.9 (L) 39.0 - 52.0 %   MCV 89.5 78.0 - 100.0 fL   MCH 30.4 26.0 - 34.0 pg   MCHC 34.0 30.0 - 36.0 g/dL   RDW 13.6 11.5 - 15.5 %   Platelets 141 (L) 150 - 400 K/uL   Neutrophils Relative % 66 43 - 77 %   Neutro Abs 6.0 1.7 - 7.7 K/uL   Lymphocytes Relative 19 12 - 46 %   Lymphs Abs 1.8 0.7 - 4.0 K/uL   Monocytes Relative 7 3 - 12 %   Monocytes Absolute 0.7 0.1 - 1.0 K/uL   Eosinophils Relative 8 (H) 0 - 5 %   Eosinophils Absolute 0.8 (H) 0.0 - 0.7 K/uL   Basophils Relative 0 0 - 1 %   Basophils Absolute 0.0 0.0 - 0.1 K/uL  Basic metabolic panel     Status: Abnormal   Collection  Time: 09/13/14  2:04 PM  Result Value Ref Range   Sodium 136 135 - 145 mmol/L   Potassium 3.3 (L) 3.5 - 5.1 mmol/L   Chloride 102 101 - 111 mmol/L   CO2 24 22 - 32 mmol/L   Glucose, Bld 337 (H) 65 - 99 mg/dL   BUN 35 (H) 6 - 20 mg/dL   Creatinine, Ser 1.58 (H) 0.61 - 1.24 mg/dL   Calcium 9.1 8.9 - 10.3 mg/dL   GFR calc non Af Amer 36 (L) >60 mL/min   GFR calc Af Amer 42 (L) >60 mL/min    Comment: (NOTE) The eGFR has been calculated using the CKD EPI equation. This calculation has not been validated in all clinical situations. eGFR's persistently <60 mL/min signify possible Chronic Kidney Disease.    Anion gap 10 5 - 15  Protime-INR     Status: Abnormal   Collection Time: 09/13/14  2:04 PM  Result Value Ref Range   Prothrombin Time 15.8 (H) 11.6 - 15.2 seconds   INR 1.24 0.00 - 1.49  Digoxin level     Status: None   Collection Time: 09/13/14  2:04 PM  Result Value Ref Range   Digoxin Level 1.0 0.8 - 2.0 ng/mL  Urinalysis, Routine w reflex microscopic (not at [facility])     Status: Abnormal   Collection Time: 09/13/14  4:46 PM  Result Value Ref Range   Color, Urine YELLOW YELLOW   APPearance CLEAR CLEAR   Specific Gravity, Urine 1.009 1.005 - 1.030   pH 5.5 5.0 - 8.0   Glucose, UA 250 (A) NEGATIVE mg/dL   Hgb urine dipstick TRACE (A) NEGATIVE   Bilirubin Urine NEGATIVE NEGATIVE   Ketones, ur NEGATIVE NEGATIVE mg/dL  Protein, ur 100 (A) NEGATIVE mg/dL   Urobilinogen, UA 0.2 0.0 - 1.0 mg/dL   Nitrite NEGATIVE NEGATIVE   Leukocytes, UA TRACE (A) NEGATIVE  Urine microscopic-add on     Status: Abnormal   Collection Time: 09/13/14  4:46 PM  Result Value Ref Range   Squamous Epithelial / LPF RARE RARE   WBC, UA 3-6 <3 WBC/hpf   RBC / HPF 0-2 <3 RBC/hpf   Bacteria, UA FEW (A) RARE   Casts HYALINE CASTS (A) NEGATIVE  Glucose, capillary     Status: Abnormal   Collection Time: 09/13/14  8:49 PM  Result Value Ref Range   Glucose-Capillary 279 (H) 65 - 99 mg/dL  Basic metabolic  panel     Status: Abnormal   Collection Time: 09/14/14  4:09 AM  Result Value Ref Range   Sodium 139 135 - 145 mmol/L   Potassium 4.3 3.5 - 5.1 mmol/L    Comment: DELTA CHECK NOTED   Chloride 101 101 - 111 mmol/L   CO2 29 22 - 32 mmol/L   Glucose, Bld 250 (H) 65 - 99 mg/dL   BUN 41 (H) 6 - 20 mg/dL   Creatinine, Ser 1.80 (H) 0.61 - 1.24 mg/dL   Calcium 8.7 (L) 8.9 - 10.3 mg/dL   GFR calc non Af Amer 31 (L) >60 mL/min   GFR calc Af Amer 36 (L) >60 mL/min    Comment: (NOTE) The eGFR has been calculated using the CKD EPI equation. This calculation has not been validated in all clinical situations. eGFR's persistently <60 mL/min signify possible Chronic Kidney Disease.    Anion gap 9 5 - 15  CBC     Status: Abnormal   Collection Time: 09/14/14  4:09 AM  Result Value Ref Range   WBC 8.8 4.0 - 10.5 K/uL   RBC 3.31 (L) 4.22 - 5.81 MIL/uL   Hemoglobin 10.2 (L) 13.0 - 17.0 g/dL   HCT 29.9 (L) 39.0 - 52.0 %   MCV 90.3 78.0 - 100.0 fL   MCH 30.8 26.0 - 34.0 pg   MCHC 34.1 30.0 - 36.0 g/dL   RDW 13.8 11.5 - 15.5 %   Platelets 120 (L) 150 - 400 K/uL  Glucose, capillary     Status: Abnormal   Collection Time: 09/14/14  7:51 AM  Result Value Ref Range   Glucose-Capillary 212 (H) 65 - 99 mg/dL   Comment 1 Notify RN     Dg Chest 1 View  09/13/2014   CLINICAL DATA:  Fall today.  Fall from wheelchair.  EXAM: CHEST  1 VIEW  COMPARISON:  04/29/2009.  FINDINGS: Cardiomegaly is present. There is no failure. Two lead LEFT subclavian cardiac pacemaker. No airspace disease. No displaced fractures or pneumothorax. No pleural effusion.  IMPRESSION: Cardiomegaly without failure.   Electronically Signed   By: Dereck Ligas M.D.   On: 09/13/2014 15:41   Dg Pelvis 1-2 Views  09/13/2014   CLINICAL DATA:  Fall today  EXAM: PELVIS - 1-2 VIEW  COMPARISON:  04/26/2009  FINDINGS: Left hip hemiarthroplasty in satisfactory position and alignment.  Healed fractures of the left acetabulum and left superior and  inferior pubic rami.  Negative for acute fracture or dislocation  IMPRESSION: No acute abnormality.   Electronically Signed   By: Franchot Gallo M.D.   On: 09/13/2014 15:40   Ct Head Wo Contrast  09/13/2014   CLINICAL DATA:  [place] from wheelchair when chair hit a knee then pavement, no loss of consciousness,  frontal puncture wound, LEFT infraorbital laceration, on blood thinners, history CHF, ischemic cardiomyopathy, coronary artery disease, hypertension, type II diabetes mellitus, former smoker  EXAM: CT HEAD WITHOUT CONTRAST  CT MAXILLOFACIAL WITHOUT CONTRAST  CT CERVICAL SPINE WITHOUT CONTRAST  TECHNIQUE: Multidetector CT imaging of the head, cervical spine, and maxillofacial structures were performed using the standard protocol without intravenous contrast. Multiplanar CT image reconstructions of the cervical spine and maxillofacial structures were also generated. RIGHT-side of face marked with a BB.  COMPARISON:  CT head 12/26/2009  FINDINGS: CT HEAD FINDINGS  Generalized atrophy.  Stable ex vacuo dilatation of the ventricular system.  No midline shift or mass effect.  Small vessel chronic ischemic changes of deep cerebral white matter.  Old RIGHT basal ganglia lacunar infarct.  No intracranial hemorrhage, mass lesion, or evidence acute infarction.  No extra-axial fluid collections.  LEFT frontal bone fracture, see below.  CT MAXILLOFACIAL FINDINGS  Large scalp hematoma LEFT frontal extending LEFT periorbital, to LEFT nose, and LEFT cheek.  Nasal septal deviation to the LEFT.  Complete opacification of the RIGHT maxillary sinus with high attenuation question blood.  Partial opacification of sphenoid sinus, ethmoid air cells LEFT greater than RIGHT, LEFT frontal sinus, and LEFT maxillary sinus.  Depressed fracture involving the anterior wall of the LEFT frontal sinus, extending into the LEFT orbital roof with associated LEFT orbital pneumatosis.  No definite pneumocephalus is seen though a fracture plane at  the posterior wall LEFT frontal sinus traverses the LEFT frontal bone.  Single tiny focus of pneumocephalus at the inferior aspect of the RIGHT orbit, likely nondisplaced anterior RIGHT maxillary wall fracture extending into inferior RIGHT orbital wall.  High attenuation within RIGHT maxillary sinus may represent a hematoma.  Remaining orbital walls and facial bones appear grossly intact.  CT CERVICAL SPINE FINDINGS  Osseous demineralization.  Visualized skullbase intact.  Multilevel facet degenerative changes.  Diffuse disc space narrowing with endplate spur formation at C5-C6 and C6-C7.  Vertebral body heights maintained without fracture or subluxation.  No bone destruction.  Lung apices clear.  Distended esophagus containing food debris and fluid.  Scattered atherosclerotic calcifications.  IMPRESSION: Generalized atrophy with small vessel chronic ischemic changes of deep cerebral white matter.  Tiny old lacunar infarct RIGHT basal ganglia.  No acute intracranial abnormalities.  Large LEFT facial/frontal scalp hematoma.  Depressed fracture involving anterior wall of LEFT frontal sinus extending into LEFT orbital roof.  Nondisplaced fracture planes involving the posterior wall of the LEFT frontal sinus without definite pneumocephalus or intracranial hematoma.  Probable nondisplaced fracture anterior wall RIGHT maxillary sinus extending to the RIGHT inferior orbital wall with minimal in RIGHT orbital pneumatosis.  Probable RIGHT maxillary sinus hematoma.  Multilevel degenerative disc and facet disease changes of the cervical spine.  No acute cervical spine abnormalities.  Findings called to Dr. Aline Brochure on 09/13/2014 at 1653 hours.   Electronically Signed   By: Lavonia Dana M.D.   On: 09/13/2014 16:54   Ct Cervical Spine Wo Contrast  09/13/2014   CLINICAL DATA:  [place] from wheelchair when chair hit a knee then pavement, no loss of consciousness, frontal puncture wound, LEFT infraorbital laceration, on blood  thinners, history CHF, ischemic cardiomyopathy, coronary artery disease, hypertension, type II diabetes mellitus, former smoker  EXAM: CT HEAD WITHOUT CONTRAST  CT MAXILLOFACIAL WITHOUT CONTRAST  CT CERVICAL SPINE WITHOUT CONTRAST  TECHNIQUE: Multidetector CT imaging of the head, cervical spine, and maxillofacial structures were performed using the standard protocol without intravenous contrast. Multiplanar CT image  reconstructions of the cervical spine and maxillofacial structures were also generated. RIGHT-side of face marked with a BB.  COMPARISON:  CT head 12/26/2009  FINDINGS: CT HEAD FINDINGS  Generalized atrophy.  Stable ex vacuo dilatation of the ventricular system.  No midline shift or mass effect.  Small vessel chronic ischemic changes of deep cerebral white matter.  Old RIGHT basal ganglia lacunar infarct.  No intracranial hemorrhage, mass lesion, or evidence acute infarction.  No extra-axial fluid collections.  LEFT frontal bone fracture, see below.  CT MAXILLOFACIAL FINDINGS  Large scalp hematoma LEFT frontal extending LEFT periorbital, to LEFT nose, and LEFT cheek.  Nasal septal deviation to the LEFT.  Complete opacification of the RIGHT maxillary sinus with high attenuation question blood.  Partial opacification of sphenoid sinus, ethmoid air cells LEFT greater than RIGHT, LEFT frontal sinus, and LEFT maxillary sinus.  Depressed fracture involving the anterior wall of the LEFT frontal sinus, extending into the LEFT orbital roof with associated LEFT orbital pneumatosis.  No definite pneumocephalus is seen though a fracture plane at the posterior wall LEFT frontal sinus traverses the LEFT frontal bone.  Single tiny focus of pneumocephalus at the inferior aspect of the RIGHT orbit, likely nondisplaced anterior RIGHT maxillary wall fracture extending into inferior RIGHT orbital wall.  High attenuation within RIGHT maxillary sinus may represent a hematoma.  Remaining orbital walls and facial bones appear  grossly intact.  CT CERVICAL SPINE FINDINGS  Osseous demineralization.  Visualized skullbase intact.  Multilevel facet degenerative changes.  Diffuse disc space narrowing with endplate spur formation at C5-C6 and C6-C7.  Vertebral body heights maintained without fracture or subluxation.  No bone destruction.  Lung apices clear.  Distended esophagus containing food debris and fluid.  Scattered atherosclerotic calcifications.  IMPRESSION: Generalized atrophy with small vessel chronic ischemic changes of deep cerebral white matter.  Tiny old lacunar infarct RIGHT basal ganglia.  No acute intracranial abnormalities.  Large LEFT facial/frontal scalp hematoma.  Depressed fracture involving anterior wall of LEFT frontal sinus extending into LEFT orbital roof.  Nondisplaced fracture planes involving the posterior wall of the LEFT frontal sinus without definite pneumocephalus or intracranial hematoma.  Probable nondisplaced fracture anterior wall RIGHT maxillary sinus extending to the RIGHT inferior orbital wall with minimal in RIGHT orbital pneumatosis.  Probable RIGHT maxillary sinus hematoma.  Multilevel degenerative disc and facet disease changes of the cervical spine.  No acute cervical spine abnormalities.  Findings called to Dr. Aline Brochure on 09/13/2014 at 1653 hours.   Electronically Signed   By: Lavonia Dana M.D.   On: 09/13/2014 16:54   Dg Hand Complete Left  09/13/2014   CLINICAL DATA:  Fall, bilateral hand pain  EXAM: LEFT HAND - COMPLETE 3+ VIEW  COMPARISON:  None.  FINDINGS: No fracture or dislocation is seen.  The joint spaces are preserved.  The visualized soft tissues are unremarkable.  IMPRESSION: No fracture or dislocation is seen.   Electronically Signed   By: Julian Hy M.D.   On: 09/13/2014 15:41   Dg Hand Complete Right  09/13/2014   CLINICAL DATA:  [place] out of wheelchair today, BILATERAL hand pain  EXAM: RIGHT HAND - COMPLETE 3+ VIEW  COMPARISON:  None  FINDINGS: Osseous mineralization  grossly normal for age.  Joint spaces preserved.  Fingers superimposed on lateral view limiting assessment.  No acute fracture, dislocation, or bone destruction.  Small vessel vascular calcification at wrist.  IMPRESSION: No acute osseous abnormalities.   Electronically Signed   By: Lavonia Dana  M.D.   On: 09/13/2014 15:41   Ct Maxillofacial Wo Cm  09/13/2014   CLINICAL DATA:  [place] from wheelchair when chair hit a knee then pavement, no loss of consciousness, frontal puncture wound, LEFT infraorbital laceration, on blood thinners, history CHF, ischemic cardiomyopathy, coronary artery disease, hypertension, type II diabetes mellitus, former smoker  EXAM: CT HEAD WITHOUT CONTRAST  CT MAXILLOFACIAL WITHOUT CONTRAST  CT CERVICAL SPINE WITHOUT CONTRAST  TECHNIQUE: Multidetector CT imaging of the head, cervical spine, and maxillofacial structures were performed using the standard protocol without intravenous contrast. Multiplanar CT image reconstructions of the cervical spine and maxillofacial structures were also generated. RIGHT-side of face marked with a BB.  COMPARISON:  CT head 12/26/2009  FINDINGS: CT HEAD FINDINGS  Generalized atrophy.  Stable ex vacuo dilatation of the ventricular system.  No midline shift or mass effect.  Small vessel chronic ischemic changes of deep cerebral white matter.  Old RIGHT basal ganglia lacunar infarct.  No intracranial hemorrhage, mass lesion, or evidence acute infarction.  No extra-axial fluid collections.  LEFT frontal bone fracture, see below.  CT MAXILLOFACIAL FINDINGS  Large scalp hematoma LEFT frontal extending LEFT periorbital, to LEFT nose, and LEFT cheek.  Nasal septal deviation to the LEFT.  Complete opacification of the RIGHT maxillary sinus with high attenuation question blood.  Partial opacification of sphenoid sinus, ethmoid air cells LEFT greater than RIGHT, LEFT frontal sinus, and LEFT maxillary sinus.  Depressed fracture involving the anterior wall of the LEFT  frontal sinus, extending into the LEFT orbital roof with associated LEFT orbital pneumatosis.  No definite pneumocephalus is seen though a fracture plane at the posterior wall LEFT frontal sinus traverses the LEFT frontal bone.  Single tiny focus of pneumocephalus at the inferior aspect of the RIGHT orbit, likely nondisplaced anterior RIGHT maxillary wall fracture extending into inferior RIGHT orbital wall.  High attenuation within RIGHT maxillary sinus may represent a hematoma.  Remaining orbital walls and facial bones appear grossly intact.  CT CERVICAL SPINE FINDINGS  Osseous demineralization.  Visualized skullbase intact.  Multilevel facet degenerative changes.  Diffuse disc space narrowing with endplate spur formation at C5-C6 and C6-C7.  Vertebral body heights maintained without fracture or subluxation.  No bone destruction.  Lung apices clear.  Distended esophagus containing food debris and fluid.  Scattered atherosclerotic calcifications.  IMPRESSION: Generalized atrophy with small vessel chronic ischemic changes of deep cerebral white matter.  Tiny old lacunar infarct RIGHT basal ganglia.  No acute intracranial abnormalities.  Large LEFT facial/frontal scalp hematoma.  Depressed fracture involving anterior wall of LEFT frontal sinus extending into LEFT orbital roof.  Nondisplaced fracture planes involving the posterior wall of the LEFT frontal sinus without definite pneumocephalus or intracranial hematoma.  Probable nondisplaced fracture anterior wall RIGHT maxillary sinus extending to the RIGHT inferior orbital wall with minimal in RIGHT orbital pneumatosis.  Probable RIGHT maxillary sinus hematoma.  Multilevel degenerative disc and facet disease changes of the cervical spine.  No acute cervical spine abnormalities.  Findings called to Dr. Aline Brochure on 09/13/2014 at 1653 hours.   Electronically Signed   By: Lavonia Dana M.D.   On: 09/13/2014 16:54   Review of Systems:  Constitutional: Denies fever,  chills, diaphoresis, appetite change and fatigue.  HEENT: Complains of facial pain.   Respiratory: Denies SOB, DOE, cough, chest tightness, and wheezing.  Cardiovascular: Denies chest pain, palpitations and leg swelling.  Gastrointestinal: Denies nausea, vomiting, abdominal pain, diarrhea, constipation, blood in stool and abdominal distention.  Genitourinary: Denies dysuria,  urgency, frequency, hematuria, flank pain and difficulty urinating.  Musculoskeletal: Complains of pain.  Skin: Denies pallor, rash and wound.  Neurological: Denies dizziness, seizures, syncope, weakness, light-headedness, numbness and headaches.  Hematological: Denies adenopathy. Easy bruising, personal or family bleeding history  Psychiatric/Behavioral: Denies suicidal ideation, mood changes, confusion, nervousness, sleep disturbance and agitation  Blood pressure 104/43, pulse 70, temperature 98.4 F (36.9 C), temperature source Oral, resp. rate 18, height 6' (1.829 m), weight 92.171 kg (203 lb 3.2 oz), SpO2 92 %.  Physical Exam  Constitutional: He appears well-developed and well-nourished.   Head:    Ears: Normal auricles and EACs. Mouth/Throat: Oropharynx is clear and moist. No oropharyngeal exudate.  Multiple abrasions of the face. Swelling and bruising to the nasal bridge.  Eyes: Right eye conjunctivae and EOM are normal. Pupils are equal, round, and reactive to light.  Significant bruising and left-sided periorbital swelling. Unable to visualize the left eye. ER MD was able to get a brief look at the left eye in ER, which showed no obvious superficial injury. Pupils were equal round and reactive.  Neck: Normal range of motion. Neck supple.  No vertebral tenderness.  Pulmonary/Chest: Effort normal and breath sounds normal. No respiratory distress.  Multiple abrasions to bilateral hands. Skin: Skin is warm and dry.  Psychiatric: He has a normal mood and affect. His behavior is normal.    Assessment/Plan: Facial trauma s/p fall. Facial lacerations repaired in ER. Nondisplaced maxillary and mildly depressed frontal sinus fractures. Pt likely will not have any functional deficits from his fractures. In light of his advanced age, will defer any surgical intervention. Pt will f/u in my office in 1 week.  Beauty Pless,SUI W 09/14/2014, 8:19 AM

## 2014-09-14 NOTE — Evaluation (Signed)
Clinical/Bedside Swallow Evaluation Patient Details  Name: [name] MRN: [id number] Date of Birth: Nov 26, 1919  Today's Date: 09/14/2014 Time: SLP Start Time (ACUTE ONLY): 1052 SLP Stop Time (ACUTE ONLY): 1106 SLP Time Calculation (min) (ACUTE ONLY): 14 min  Past Medical History:  Past Medical History  Diagnosis Date  . Altered mental status     felt to be secondary to acute delirium on top of a suspected underlying dementia  . Acute renal failure     from dehydration brought on by fall  . Pelvic fracture     a. s/p fall  . Depression   . Chronic systolic and diastolic heart failure   . Ischemic cardiomyopathy   . Coronary artery disease     a. s/p PTCA of LAD 2004  . Hypertension   . Obesity   . Hypothyroidism   . B12 deficiency   . Mild aortic stenosis   . Dementia     Questionable dementia  . Complete heart block     status post dual-chamber pacemaker  . Presence of permanent cardiac pacemaker    Past Surgical History:  Past Surgical History  Procedure Laterality Date  . Cholecystectomy    . Hemiarthroplasty hip Left Aug 07, 2004  . Circumcision  June 21, 2005  . Pacemaker generator change N/A 01/27/2013    Procedure: PACEMAKER GENERATOR CHANGE;  Surgeon: Gardiner Rhyme, MD;  Location: [facility] CATH LAB;  Service: Cardiovascular;  Laterality: N/A;  . Insert / replace / remove pacemaker  2004; 01/2013    MDT pacemaker implanted 2004; gen change by Dr Johney Frame to MDT [id number] 01/2013  . Fracture surgery     HPI:  Resident of SNF who sustained Facial trauma s/p fall out of w/c. Facial lacerations repaired in ER. Nondisplaced maxillary and mildly depressed frontal sinus fractures   Assessment / Plan / Recommendation Clinical Impression  Pt has intermittent baseline cough that is noted occasionally during PO intake, but also appears to occur immediately following large consecutive sips of water. With Min cues for small, single sips, this immediate coughing is eliminated.  Mastication is mildly limited as pt is edentulous. Recommend Dys 3 diet and thin liquids with full supervision.    Aspiration Risk  Mild    Diet Recommendation Dysphagia 3 (Mech soft);Thin   Medication Administration: Whole meds with puree Compensations: Slow rate;Small sips/bites    Other  Recommendations Oral Care Recommendations: Oral care BID   Follow Up Recommendations   tba    Frequency and Duration min 2x/week  1 week   Pertinent Vitals/Pain n/a    SLP Swallow Goals     Swallow Study Prior Functional Status       General Other Pertinent Information: Resident of SNF who sustained Facial trauma s/p fall out of w/c. Facial lacerations repaired in ER. Nondisplaced maxillary and mildly depressed frontal sinus fractures Type of Study: Bedside swallow evaluation Previous Swallow Assessment: none in chart Diet Prior to this Study: NPO Temperature Spikes Noted: No Respiratory Status: Room air ([place] removed due to epistaxis, SpO2 94%) History of Recent Intubation: No Behavior/Cognition: Alert;Cooperative;Pleasant mood;Requires cueing Oral Cavity - Dentition: Edentulous;Other (Comment) (pt does not always wear dentures during intake) Self-Feeding Abilities: Needs assist Patient Positioning: Upright in chair/Tumbleform Baseline Vocal Quality: Normal Volitional Cough: Congested    Oral/Motor/Sensory Function Overall Oral Motor/Sensory Function: Appears within functional limits for tasks assessed   Ice Chips Ice chips: Not tested   Thin Liquid Thin Liquid: Impaired Presentation: Cup;Self Fed;Straw Pharyngeal  Phase Impairments: Suspected delayed Swallow;Cough - Immediate    Nectar Thick Nectar Thick Liquid: Not tested   Honey Thick Honey Thick Liquid: Not tested   Puree Puree: Within functional limits Presentation: Spoon   Solid   GO Functional Assessment Tool Used: skilled clinical judgment Functional Limitations: Swallowing Swallow Current Status [phone]): At least  1 percent but less than 20 percent impaired, limited or restricted Swallow Goal Status [phone]): At least 1 percent but less than 20 percent impaired, limited or restricted  Solid: Impaired Presentation: Self Fed Oral Phase Impairments: Impaired mastication        Maxcine Ham, M.A. CCC-SLP [phone]  Maxcine Ham 09/14/2014,12:14 PM

## 2014-09-15 DIAGNOSIS — N179 Acute kidney failure, unspecified: Secondary | ICD-10-CM

## 2014-09-15 DIAGNOSIS — N183 Chronic kidney disease, stage 3 (moderate): Secondary | ICD-10-CM | POA: Diagnosis present

## 2014-09-15 DIAGNOSIS — Z87891 Personal history of nicotine dependence: Secondary | ICD-10-CM | POA: Diagnosis not present

## 2014-09-15 DIAGNOSIS — D649 Anemia, unspecified: Secondary | ICD-10-CM

## 2014-09-15 DIAGNOSIS — I2510 Atherosclerotic heart disease of native coronary artery without angina pectoris: Secondary | ICD-10-CM | POA: Diagnosis present

## 2014-09-15 DIAGNOSIS — Z794 Long term (current) use of insulin: Secondary | ICD-10-CM | POA: Diagnosis not present

## 2014-09-15 DIAGNOSIS — Z23 Encounter for immunization: Secondary | ICD-10-CM | POA: Diagnosis not present

## 2014-09-15 DIAGNOSIS — Z7901 Long term (current) use of anticoagulants: Secondary | ICD-10-CM | POA: Diagnosis not present

## 2014-09-15 DIAGNOSIS — E785 Hyperlipidemia, unspecified: Secondary | ICD-10-CM | POA: Diagnosis present

## 2014-09-15 DIAGNOSIS — W050XXA Fall from non-moving wheelchair, initial encounter: Secondary | ICD-10-CM | POA: Diagnosis present

## 2014-09-15 DIAGNOSIS — E876 Hypokalemia: Secondary | ICD-10-CM | POA: Diagnosis present

## 2014-09-15 DIAGNOSIS — I5022 Chronic systolic (congestive) heart failure: Secondary | ICD-10-CM | POA: Diagnosis not present

## 2014-09-15 DIAGNOSIS — F419 Anxiety disorder, unspecified: Secondary | ICD-10-CM | POA: Diagnosis present

## 2014-09-15 DIAGNOSIS — Z950 Presence of cardiac pacemaker: Secondary | ICD-10-CM | POA: Diagnosis not present

## 2014-09-15 DIAGNOSIS — E1122 Type 2 diabetes mellitus with diabetic chronic kidney disease: Secondary | ICD-10-CM | POA: Diagnosis present

## 2014-09-15 DIAGNOSIS — K219 Gastro-esophageal reflux disease without esophagitis: Secondary | ICD-10-CM | POA: Diagnosis present

## 2014-09-15 DIAGNOSIS — E669 Obesity, unspecified: Secondary | ICD-10-CM | POA: Diagnosis present

## 2014-09-15 DIAGNOSIS — Z888 Allergy status to other drugs, medicaments and biological substances status: Secondary | ICD-10-CM | POA: Diagnosis not present

## 2014-09-15 DIAGNOSIS — I482 Chronic atrial fibrillation: Secondary | ICD-10-CM | POA: Diagnosis not present

## 2014-09-15 DIAGNOSIS — S02401A Maxillary fracture, unspecified, initial encounter for closed fracture: Secondary | ICD-10-CM | POA: Diagnosis present

## 2014-09-15 DIAGNOSIS — G4700 Insomnia, unspecified: Secondary | ICD-10-CM | POA: Diagnosis present

## 2014-09-15 DIAGNOSIS — I442 Atrioventricular block, complete: Secondary | ICD-10-CM | POA: Diagnosis present

## 2014-09-15 DIAGNOSIS — N189 Chronic kidney disease, unspecified: Secondary | ICD-10-CM

## 2014-09-15 DIAGNOSIS — Z885 Allergy status to narcotic agent status: Secondary | ICD-10-CM | POA: Diagnosis not present

## 2014-09-15 DIAGNOSIS — F329 Major depressive disorder, single episode, unspecified: Secondary | ICD-10-CM | POA: Diagnosis present

## 2014-09-15 DIAGNOSIS — I5042 Chronic combined systolic (congestive) and diastolic (congestive) heart failure: Secondary | ICD-10-CM | POA: Diagnosis present

## 2014-09-15 DIAGNOSIS — Z7902 Long term (current) use of antithrombotics/antiplatelets: Secondary | ICD-10-CM | POA: Diagnosis not present

## 2014-09-15 DIAGNOSIS — Z6827 Body mass index (BMI) 27.0-27.9, adult: Secondary | ICD-10-CM | POA: Diagnosis not present

## 2014-09-15 DIAGNOSIS — Z96642 Presence of left artificial hip joint: Secondary | ICD-10-CM | POA: Diagnosis present

## 2014-09-15 DIAGNOSIS — S0292XD Unspecified fracture of facial bones, subsequent encounter for fracture with routine healing: Secondary | ICD-10-CM | POA: Diagnosis not present

## 2014-09-15 DIAGNOSIS — E039 Hypothyroidism, unspecified: Secondary | ICD-10-CM | POA: Diagnosis present

## 2014-09-15 DIAGNOSIS — Z955 Presence of coronary angioplasty implant and graft: Secondary | ICD-10-CM | POA: Diagnosis not present

## 2014-09-15 DIAGNOSIS — E538 Deficiency of other specified B group vitamins: Secondary | ICD-10-CM | POA: Diagnosis present

## 2014-09-15 DIAGNOSIS — F0390 Unspecified dementia without behavioral disturbance: Secondary | ICD-10-CM | POA: Diagnosis present

## 2014-09-15 DIAGNOSIS — I350 Nonrheumatic aortic (valve) stenosis: Secondary | ICD-10-CM | POA: Diagnosis present

## 2014-09-15 DIAGNOSIS — S0292XA Unspecified fracture of facial bones, initial encounter for closed fracture: Secondary | ICD-10-CM | POA: Diagnosis not present

## 2014-09-15 DIAGNOSIS — S0181XA Laceration without foreign body of other part of head, initial encounter: Secondary | ICD-10-CM | POA: Diagnosis present

## 2014-09-15 DIAGNOSIS — Z9049 Acquired absence of other specified parts of digestive tract: Secondary | ICD-10-CM | POA: Diagnosis present

## 2014-09-15 DIAGNOSIS — I255 Ischemic cardiomyopathy: Secondary | ICD-10-CM | POA: Diagnosis present

## 2014-09-15 DIAGNOSIS — I129 Hypertensive chronic kidney disease with stage 1 through stage 4 chronic kidney disease, or unspecified chronic kidney disease: Secondary | ICD-10-CM | POA: Diagnosis present

## 2014-09-15 DIAGNOSIS — Z79899 Other long term (current) drug therapy: Secondary | ICD-10-CM | POA: Diagnosis not present

## 2014-09-15 DIAGNOSIS — S0219XA Other fracture of base of skull, initial encounter for closed fracture: Secondary | ICD-10-CM | POA: Diagnosis present

## 2014-09-15 LAB — GLUCOSE, CAPILLARY
GLUCOSE-CAPILLARY: 163 mg/dL — AB (ref 65–99)
Glucose-Capillary: 105 mg/dL — ABNORMAL HIGH (ref 65–99)
Glucose-Capillary: 169 mg/dL — ABNORMAL HIGH (ref 65–99)
Glucose-Capillary: 170 mg/dL — ABNORMAL HIGH (ref 65–99)

## 2014-09-15 LAB — BASIC METABOLIC PANEL
Anion gap: 8 (ref 5–15)
BUN: 44 mg/dL — AB (ref 6–20)
CO2: 30 mmol/L (ref 22–32)
CREATININE: 2.09 mg/dL — AB (ref 0.61–1.24)
Calcium: 8.4 mg/dL — ABNORMAL LOW (ref 8.9–10.3)
Chloride: 101 mmol/L (ref 101–111)
GFR calc Af Amer: 30 mL/min — ABNORMAL LOW (ref >60)
GFR, EST NON AFRICAN AMERICAN: 26 mL/min — AB (ref >60)
GLUCOSE: 123 mg/dL — AB (ref 65–99)
POTASSIUM: 3.8 mmol/L (ref 3.5–5.1)
Sodium: 139 mmol/L (ref 135–145)

## 2014-09-15 LAB — CBC
HEMATOCRIT: 25.5 % — AB (ref 39.0–52.0)
HEMOGLOBIN: 8.6 g/dL — AB (ref 13.0–17.0)
MCH: 30.9 pg (ref 26.0–34.0)
MCHC: 33.7 g/dL (ref 30.0–36.0)
MCV: 91.7 fL (ref 78.0–100.0)
Platelets: 103 10*3/uL — ABNORMAL LOW (ref 150–400)
RBC: 2.78 MIL/uL — ABNORMAL LOW (ref 4.22–5.81)
RDW: 14.3 % (ref 11.5–15.5)
WBC: 7.7 10*3/uL (ref 4.0–10.5)

## 2014-09-15 LAB — VITAMIN B12: VITAMIN B 12: 430 pg/mL (ref 180–914)

## 2014-09-15 LAB — FERRITIN: Ferritin: 65 ng/mL (ref 24–336)

## 2014-09-15 LAB — RETICULOCYTES
RBC.: 2.85 MIL/uL — AB (ref 4.22–5.81)
Retic Count, Absolute: 85.5 10*3/uL (ref 19.0–186.0)
Retic Ct Pct: 3 % (ref 0.4–3.1)

## 2014-09-15 LAB — IRON AND TIBC
Iron: 29 ug/dL — ABNORMAL LOW (ref 45–182)
SATURATION RATIOS: 11 % — AB (ref 17.9–39.5)
TIBC: 270 ug/dL (ref 250–450)
UIBC: 241 ug/dL

## 2014-09-15 LAB — FOLATE: FOLATE: 40.6 ng/mL (ref >5.9)

## 2014-09-15 MED ORDER — SODIUM CHLORIDE 0.9 % IV SOLN
1.5000 g | Freq: Two times a day (BID) | INTRAVENOUS | Status: DC
  Administered 2014-09-15 – 2014-09-16 (×2): 1.5 g via INTRAVENOUS
  Filled 2014-09-15 (×3): qty 1.5

## 2014-09-15 MED ORDER — SODIUM CHLORIDE 0.9 % IV SOLN
INTRAVENOUS | Status: DC
  Administered 2014-09-15: 14:00:00 via INTRAVENOUS

## 2014-09-15 MED ORDER — BACITRACIN ZINC 500 UNIT/GM EX OINT: 1.0000 "application " | TOPICAL_OINTMENT | CUTANEOUS | Status: DC | PRN

## 2014-09-15 NOTE — Progress Notes (Signed)
[facility]                                                                              Patient Demographics  [name], is a 81 y.o. male, DOB - 1920-03-12, WVP:[id number]  Admit date - 09/13/2014   Admitting Physician Edsel Petrin, DO  Outpatient Primary MD for the patient is Oneal Grout, MD  LOS -    Chief Complaint  Patient presents with  . Fall      HPI on 09/13/2014 [name] is a 81 y.o. male with a history of atrial fibrillation, chronic systolic heart failure, complete heart block status post pacemaker, dementia, presented to the emergency department after sustaining a fall at the nursing facility. It seems that patient's wheelchair had "gotten stuck", and patient fell out of the wheelchair hitting the pavement. Upon arrival to the emergency department, patient was noted to have facial swelling. CT of the face showed maxillary fracture. EDP call trauma surgery as well as ENT. ENT will see patient in the morning. TRH was called for admission for pain management.  Assessment & Plan  Facial fracture due to mechanical fall -CT maxillofacial/CT head showed no acute intracranial abnormalities, old lacunar infarct, left frontal scalp and facial hematoma. Right maxillary sinus hematoma, nondisplaced fracture posterior wall of the left frontal sinus. -EDP spoke with Dr. Suszanne Conners and Dr. Renaldo Fiddler who will see the patient. No acute surgical intervention.  ENT, Dr. Suszanne Conners, consulted and appreciated.  Will follow up with patient in 1week.  Did not recommended continuing antibiotics at discharge.  No surgical intervention at this time.  -Continue pain control  -Continue unasyn empirically -Speech recommended dysphagia 3 diet -PT consult pending -OT recommended SNF  Acute on Chronic normocytic anemia -baseline Hb appears to be between 10-11 -Currently 8.6, possible dilutional component vs blood loss from recent fall and trauma -will continue to monitor CBC closely -Will  obtain FOBT and anemia panel  History of complete heart block -Patient has pacemaker in place  Hypokalemia -Like a secondary diuretics -Resolved, continue to monitor BMP  Acute on Chronic kidney disease, stage 3 -Creatinine currently 2.09, baseline 1.6 -Will start gentle IVF and continue to monitor BMP -Continue to monitor BMP -Will hold diuretics  -Will not obtain urine electrolytes as patient was on diuretics   Dementia -Mild, not on any medication  Atrial fibrillation -Patient takes Eliquis -Due to fall and current trauma will hold -Given patient's age and fall risk, would consider stopping medication, discussed with daughter.   -discussed with ENT, recommended resumption if necessary in 3-4 days. -Continue digoxin and Coreg  Chronic systolic heart failure/ischemic cardiomyopathy  -Hold diuretics and monitor closely -Monitor intake and output, daily weights  -Echocardiogram from 2010 shows an EF of 40-45%   Diabetes mellitus -Continue Lantus, sliding scale, CBG monitoring.  Hypothyroidism -Continue Synthroid  Hyperlipidemia -Continue statin  GERD -Continue Protonix  Insomnia -Continue temazepam  Code Status: Full  Family Communication: None at bedside.  Daughter via phone.   Disposition Plan: Admitted. Continue to monitor.  SNF likely 6/24 if hemoglobin remains stable and Cr improves.  Time Spent in minutes   30 minutes  Procedures  None  Consults  ENT, Dr. Suszanne Conners  DVT Prophylaxis  SCDs  Lab Results  Component Value Date   PLT 103* 09/15/2014    Medications  Scheduled Meds: . ampicillin-sulbactam (UNASYN) IV  3 g Intravenous Q8H  . carvedilol  3.125 mg Oral BID WC  . digoxin  0.125 mg Oral Daily  . docusate sodium  100 mg Oral BID  . furosemide  20 mg Oral BID  . insulin aspart  0-15 Units Subcutaneous TID WC  . insulin glargine  20 Units Subcutaneous QHS  . insulin glargine  40 Units Subcutaneous Daily  . levothyroxine  100 mcg Oral  QAC breakfast  . loratadine  10 mg Oral Daily  . metolazone  2.5 mg Oral Daily  . pantoprazole  20 mg Oral Daily  . sertraline  75 mg Oral Daily  . simvastatin  20 mg Oral q1800  . sodium chloride  3 mL Intravenous Q12H   Continuous Infusions: . sodium chloride 100 mL/hr at 09/15/14 0730   PRN Meds:.sodium chloride, acetaminophen **OR** acetaminophen, ALPRAZolam, morphine injection, ondansetron **OR** ondansetron (ZOFRAN) IV, sodium chloride, temazepam  Antibiotics    Anti-infectives    Start     Dose/Rate Route Frequency Ordered Stop   09/14/14 0200  Ampicillin-Sulbactam (UNASYN) 3 g in sodium chloride 0.9 % 100 mL IVPB     3 g 100 mL/hr over 60 Minutes Intravenous Every 8 hours 09/14/14 0004     09/13/14 1800  ceFAZolin (ANCEF) IVPB 1 g/50 mL premix  Status:  Discontinued     1 g 100 mL/hr over 30 Minutes Intravenous  Once 09/13/14 1749 09/13/14 1750   09/13/14 1800  Ampicillin-Sulbactam (UNASYN) 3 g in sodium chloride 0.9 % 100 mL IVPB     3 g 100 mL/hr over 60 Minutes Intravenous  Once 09/13/14 1750 09/13/14 1927      Subjective:   [name] seen and examined today. Patient has complaints of facial pain, and is unable to open his left eye.  Denies chest pain, shortness of breath, abdominal pain.   Objective:   Filed Vitals:   09/14/14 2142 09/14/14 2250 09/15/14 0639 09/15/14 1058  BP:  104/47 119/52   Pulse: 70 70 70 66  Temp:  99.1 F (37.3 C) 98.7 F (37.1 C)   TempSrc:  Oral Oral   Resp: 18 19 20    Height:      Weight:      SpO2: 93% 92% 91%     Wt Readings from Last 3 Encounters:  09/14/14 92.171 kg (203 lb 3.2 oz)  09/07/14 95.255 kg (210 lb)  08/11/14 90.266 kg (199 lb)     Intake/Output Summary (Last 24 hours) at 09/15/14 1126 Last data filed at 09/15/14 0900  Gross per 24 hour  Intake    240 ml  Output    200 ml  Net     40 ml    Exam  General: Well developed, well nourished, no distress  HEENT: [place], Left frontal hematoma, ecchymosis,  multiple abrasions of the face, mild edema and bruising of the nasal bridge, moderate bruising and left-sided. Orbital swelling- improving.  Patient able to open left eye.  PERRLA. mucous membranes moist.   Cardiovascular: S1 S2 auscultated, 2/6 SEM, regular  Respiratory: Clear to auscultation bilaterally with equal chest rise  Abdomen: Soft, nontender, nondistended, + bowel sounds  Extremities: warm dry without cyanosis clubbing. Mild LE edema B/L.  Neuro: AAOx2, nonfocal  Skin: Multiple abrasions and bruising noted. No rashes.  Psych: Normal  affect and demeanor, pleasant  Data Review   Micro Results No results found for this or any previous visit (from the past 240 hour(s)).  Radiology Reports Dg Chest 1 View  09/13/2014   CLINICAL DATA:  Fall today.  Fall from wheelchair.  EXAM: CHEST  1 VIEW  COMPARISON:  04/29/2009.  FINDINGS: Cardiomegaly is present. There is no failure. Two lead LEFT subclavian cardiac pacemaker. No airspace disease. No displaced fractures or pneumothorax. No pleural effusion.  IMPRESSION: Cardiomegaly without failure.   Electronically Signed   By: Andreas Newport M.D.   On: 09/13/2014 15:41   Dg Pelvis 1-2 Views  09/13/2014   CLINICAL DATA:  Fall today  EXAM: PELVIS - 1-2 VIEW  COMPARISON:  04/26/2009  FINDINGS: Left hip hemiarthroplasty in satisfactory position and alignment.  Healed fractures of the left acetabulum and left superior and inferior pubic rami.  Negative for acute fracture or dislocation  IMPRESSION: No acute abnormality.   Electronically Signed   By: Marlan Palau M.D.   On: 09/13/2014 15:40   Ct Head Wo Contrast  09/13/2014   CLINICAL DATA:  Larey Seat from wheelchair when chair hit a knee then pavement, no loss of consciousness, frontal puncture wound, LEFT infraorbital laceration, on blood thinners, history CHF, ischemic cardiomyopathy, coronary artery disease, hypertension, type II diabetes mellitus, former smoker  EXAM: CT HEAD WITHOUT CONTRAST   CT MAXILLOFACIAL WITHOUT CONTRAST  CT CERVICAL SPINE WITHOUT CONTRAST  TECHNIQUE: Multidetector CT imaging of the head, cervical spine, and maxillofacial structures were performed using the standard protocol without intravenous contrast. Multiplanar CT image reconstructions of the cervical spine and maxillofacial structures were also generated. RIGHT-side of face marked with a BB.  COMPARISON:  CT head 12/26/2009  FINDINGS: CT HEAD FINDINGS  Generalized atrophy.  Stable ex vacuo dilatation of the ventricular system.  No midline shift or mass effect.  Small vessel chronic ischemic changes of deep cerebral white matter.  Old RIGHT basal ganglia lacunar infarct.  No intracranial hemorrhage, mass lesion, or evidence acute infarction.  No extra-axial fluid collections.  LEFT frontal bone fracture, see below.  CT MAXILLOFACIAL FINDINGS  Large scalp hematoma LEFT frontal extending LEFT periorbital, to LEFT nose, and LEFT cheek.  Nasal septal deviation to the LEFT.  Complete opacification of the RIGHT maxillary sinus with high attenuation question blood.  Partial opacification of sphenoid sinus, ethmoid air cells LEFT greater than RIGHT, LEFT frontal sinus, and LEFT maxillary sinus.  Depressed fracture involving the anterior wall of the LEFT frontal sinus, extending into the LEFT orbital roof with associated LEFT orbital pneumatosis.  No definite pneumocephalus is seen though a fracture plane at the posterior wall LEFT frontal sinus traverses the LEFT frontal bone.  Single tiny focus of pneumocephalus at the inferior aspect of the RIGHT orbit, likely nondisplaced anterior RIGHT maxillary wall fracture extending into inferior RIGHT orbital wall.  High attenuation within RIGHT maxillary sinus may represent a hematoma.  Remaining orbital walls and facial bones appear grossly intact.  CT CERVICAL SPINE FINDINGS  Osseous demineralization.  Visualized skullbase intact.  Multilevel facet degenerative changes.  Diffuse disc space  narrowing with endplate spur formation at C5-C6 and C6-C7.  Vertebral body heights maintained without fracture or subluxation.  No bone destruction.  Lung apices clear.  Distended esophagus containing food debris and fluid.  Scattered atherosclerotic calcifications.  IMPRESSION: Generalized atrophy with small vessel chronic ischemic changes of deep cerebral white matter.  Tiny old lacunar infarct RIGHT basal ganglia.  No acute intracranial abnormalities.  Large LEFT facial/frontal scalp hematoma.  Depressed fracture involving anterior wall of LEFT frontal sinus extending into LEFT orbital roof.  Nondisplaced fracture planes involving the posterior wall of the LEFT frontal sinus without definite pneumocephalus or intracranial hematoma.  Probable nondisplaced fracture anterior wall RIGHT maxillary sinus extending to the RIGHT inferior orbital wall with minimal in RIGHT orbital pneumatosis.  Probable RIGHT maxillary sinus hematoma.  Multilevel degenerative disc and facet disease changes of the cervical spine.  No acute cervical spine abnormalities.  Findings called to Dr. Romeo Apple on 09/13/2014 at 1653 hours.   Electronically Signed   By: Ulyses Southward M.D.   On: 09/13/2014 16:54   Ct Cervical Spine Wo Contrast  09/13/2014   CLINICAL DATA:  Larey Seat from wheelchair when chair hit a knee then pavement, no loss of consciousness, frontal puncture wound, LEFT infraorbital laceration, on blood thinners, history CHF, ischemic cardiomyopathy, coronary artery disease, hypertension, type II diabetes mellitus, former smoker  EXAM: CT HEAD WITHOUT CONTRAST  CT MAXILLOFACIAL WITHOUT CONTRAST  CT CERVICAL SPINE WITHOUT CONTRAST  TECHNIQUE: Multidetector CT imaging of the head, cervical spine, and maxillofacial structures were performed using the standard protocol without intravenous contrast. Multiplanar CT image reconstructions of the cervical spine and maxillofacial structures were also generated. RIGHT-side of face marked with a  BB.  COMPARISON:  CT head 12/26/2009  FINDINGS: CT HEAD FINDINGS  Generalized atrophy.  Stable ex vacuo dilatation of the ventricular system.  No midline shift or mass effect.  Small vessel chronic ischemic changes of deep cerebral white matter.  Old RIGHT basal ganglia lacunar infarct.  No intracranial hemorrhage, mass lesion, or evidence acute infarction.  No extra-axial fluid collections.  LEFT frontal bone fracture, see below.  CT MAXILLOFACIAL FINDINGS  Large scalp hematoma LEFT frontal extending LEFT periorbital, to LEFT nose, and LEFT cheek.  Nasal septal deviation to the LEFT.  Complete opacification of the RIGHT maxillary sinus with high attenuation question blood.  Partial opacification of sphenoid sinus, ethmoid air cells LEFT greater than RIGHT, LEFT frontal sinus, and LEFT maxillary sinus.  Depressed fracture involving the anterior wall of the LEFT frontal sinus, extending into the LEFT orbital roof with associated LEFT orbital pneumatosis.  No definite pneumocephalus is seen though a fracture plane at the posterior wall LEFT frontal sinus traverses the LEFT frontal bone.  Single tiny focus of pneumocephalus at the inferior aspect of the RIGHT orbit, likely nondisplaced anterior RIGHT maxillary wall fracture extending into inferior RIGHT orbital wall.  High attenuation within RIGHT maxillary sinus may represent a hematoma.  Remaining orbital walls and facial bones appear grossly intact.  CT CERVICAL SPINE FINDINGS  Osseous demineralization.  Visualized skullbase intact.  Multilevel facet degenerative changes.  Diffuse disc space narrowing with endplate spur formation at C5-C6 and C6-C7.  Vertebral body heights maintained without fracture or subluxation.  No bone destruction.  Lung apices clear.  Distended esophagus containing food debris and fluid.  Scattered atherosclerotic calcifications.  IMPRESSION: Generalized atrophy with small vessel chronic ischemic changes of deep cerebral white matter.  Tiny  old lacunar infarct RIGHT basal ganglia.  No acute intracranial abnormalities.  Large LEFT facial/frontal scalp hematoma.  Depressed fracture involving anterior wall of LEFT frontal sinus extending into LEFT orbital roof.  Nondisplaced fracture planes involving the posterior wall of the LEFT frontal sinus without definite pneumocephalus or intracranial hematoma.  Probable nondisplaced fracture anterior wall RIGHT maxillary sinus extending to the RIGHT inferior orbital wall with minimal in RIGHT orbital pneumatosis.  Probable RIGHT maxillary  sinus hematoma.  Multilevel degenerative disc and facet disease changes of the cervical spine.  No acute cervical spine abnormalities.  Findings called to Dr. Romeo Apple on 09/13/2014 at 1653 hours.   Electronically Signed   By: Ulyses Southward M.D.   On: 09/13/2014 16:54   Dg Hand Complete Left  09/13/2014   CLINICAL DATA:  Fall, bilateral hand pain  EXAM: LEFT HAND - COMPLETE 3+ VIEW  COMPARISON:  None.  FINDINGS: No fracture or dislocation is seen.  The joint spaces are preserved.  The visualized soft tissues are unremarkable.  IMPRESSION: No fracture or dislocation is seen.   Electronically Signed   By: Charline Bills M.D.   On: 09/13/2014 15:41   Dg Hand Complete Right  09/13/2014   CLINICAL DATA:  Larey Seat out of wheelchair today, BILATERAL hand pain  EXAM: RIGHT HAND - COMPLETE 3+ VIEW  COMPARISON:  None  FINDINGS: Osseous mineralization grossly normal for age.  Joint spaces preserved.  Fingers superimposed on lateral view limiting assessment.  No acute fracture, dislocation, or bone destruction.  Small vessel vascular calcification at wrist.  IMPRESSION: No acute osseous abnormalities.   Electronically Signed   By: Ulyses Southward M.D.   On: 09/13/2014 15:41   Ct Maxillofacial Wo Cm  09/13/2014   CLINICAL DATA:  Larey Seat from wheelchair when chair hit a knee then pavement, no loss of consciousness, frontal puncture wound, LEFT infraorbital laceration, on blood thinners, history  CHF, ischemic cardiomyopathy, coronary artery disease, hypertension, type II diabetes mellitus, former smoker  EXAM: CT HEAD WITHOUT CONTRAST  CT MAXILLOFACIAL WITHOUT CONTRAST  CT CERVICAL SPINE WITHOUT CONTRAST  TECHNIQUE: Multidetector CT imaging of the head, cervical spine, and maxillofacial structures were performed using the standard protocol without intravenous contrast. Multiplanar CT image reconstructions of the cervical spine and maxillofacial structures were also generated. RIGHT-side of face marked with a BB.  COMPARISON:  CT head 12/26/2009  FINDINGS: CT HEAD FINDINGS  Generalized atrophy.  Stable ex vacuo dilatation of the ventricular system.  No midline shift or mass effect.  Small vessel chronic ischemic changes of deep cerebral white matter.  Old RIGHT basal ganglia lacunar infarct.  No intracranial hemorrhage, mass lesion, or evidence acute infarction.  No extra-axial fluid collections.  LEFT frontal bone fracture, see below.  CT MAXILLOFACIAL FINDINGS  Large scalp hematoma LEFT frontal extending LEFT periorbital, to LEFT nose, and LEFT cheek.  Nasal septal deviation to the LEFT.  Complete opacification of the RIGHT maxillary sinus with high attenuation question blood.  Partial opacification of sphenoid sinus, ethmoid air cells LEFT greater than RIGHT, LEFT frontal sinus, and LEFT maxillary sinus.  Depressed fracture involving the anterior wall of the LEFT frontal sinus, extending into the LEFT orbital roof with associated LEFT orbital pneumatosis.  No definite pneumocephalus is seen though a fracture plane at the posterior wall LEFT frontal sinus traverses the LEFT frontal bone.  Single tiny focus of pneumocephalus at the inferior aspect of the RIGHT orbit, likely nondisplaced anterior RIGHT maxillary wall fracture extending into inferior RIGHT orbital wall.  High attenuation within RIGHT maxillary sinus may represent a hematoma.  Remaining orbital walls and facial bones appear grossly intact.  CT  CERVICAL SPINE FINDINGS  Osseous demineralization.  Visualized skullbase intact.  Multilevel facet degenerative changes.  Diffuse disc space narrowing with endplate spur formation at C5-C6 and C6-C7.  Vertebral body heights maintained without fracture or subluxation.  No bone destruction.  Lung apices clear.  Distended esophagus containing food debris and fluid.  Scattered atherosclerotic calcifications.  IMPRESSION: Generalized atrophy with small vessel chronic ischemic changes of deep cerebral white matter.  Tiny old lacunar infarct RIGHT basal ganglia.  No acute intracranial abnormalities.  Large LEFT facial/frontal scalp hematoma.  Depressed fracture involving anterior wall of LEFT frontal sinus extending into LEFT orbital roof.  Nondisplaced fracture planes involving the posterior wall of the LEFT frontal sinus without definite pneumocephalus or intracranial hematoma.  Probable nondisplaced fracture anterior wall RIGHT maxillary sinus extending to the RIGHT inferior orbital wall with minimal in RIGHT orbital pneumatosis.  Probable RIGHT maxillary sinus hematoma.  Multilevel degenerative disc and facet disease changes of the cervical spine.  No acute cervical spine abnormalities.  Findings called to Dr. Romeo Apple on 09/13/2014 at 1653 hours.   Electronically Signed   By: Ulyses Southward M.D.   On: 09/13/2014 16:54    CBC  Recent Labs Lab 09/13/14 1404 09/14/14 0409 09/15/14 0308  WBC 9.2 8.8 7.7  HGB 12.2* 10.2* 8.6*  HCT 35.9* 29.9* 25.5*  PLT 141* 120* 103*  MCV 89.5 90.3 91.7  MCH 30.4 30.8 30.9  MCHC 34.0 34.1 33.7  RDW 13.6 13.8 14.3  LYMPHSABS 1.8  --   --   MONOABS 0.7  --   --   EOSABS 0.8*  --   --   BASOSABS 0.0  --   --     Chemistries   Recent Labs Lab 09/13/14 1404 09/14/14 0409 09/15/14 0308  NA 136 139 139  K 3.3* 4.3 3.8  CL 102 101 101  CO2 24 29 30   GLUCOSE 337* 250* 123*  BUN 35* 41* 44*  CREATININE 1.58* 1.80* 2.09*  CALCIUM 9.1 8.7* 8.4*    ------------------------------------------------------------------------------------------------------------------ estimated creatinine clearance is 24.8 mL/min (by C-G formula based on Cr of 2.09). ------------------------------------------------------------------------------------------------------------------ No results for input(s): HGBA1C in the last 72 hours. ------------------------------------------------------------------------------------------------------------------ No results for input(s): CHOL, HDL, LDLCALC, TRIG, CHOLHDL, LDLDIRECT in the last 72 hours. ------------------------------------------------------------------------------------------------------------------ No results for input(s): TSH, T4TOTAL, T3FREE, THYROIDAB in the last 72 hours.  Invalid input(s): FREET3 ------------------------------------------------------------------------------------------------------------------ No results for input(s): VITAMINB12, FOLATE, FERRITIN, TIBC, IRON, RETICCTPCT in the last 72 hours.  Coagulation profile  Recent Labs Lab 09/13/14 1404  INR 1.24    No results for input(s): DDIMER in the last 72 hours.  Cardiac Enzymes No results for input(s): CKMB, TROPONINI, MYOGLOBIN in the last 168 hours.  Invalid input(s): CK ------------------------------------------------------------------------------------------------------------------ Invalid input(s): POCBNP    Murna Backer D.O. on 09/15/2014 at 11:26 AM  Between 7am to 7pm - Pager - [phone]  After 7pm go to [email or web address] - password [email or web address]  And look for the night coverage person covering for me after hours  [facility] Office  [phone]

## 2014-09-15 NOTE — Progress Notes (Signed)
Speech Language Pathology Treatment: Dysphagia  Patient Details Name: [name] MRN: [id number] DOB: 04/14/1919 Today's Date: 09/15/2014 Time: 0939-1000 SLP Time Calculation (min) (ACUTE ONLY): 21 min  Assessment / Plan / Recommendation Clinical Impression  Pt continues to have some impulsivity with intake of liquids, particularly via straw, which leads to immediate but subtle coughing. With straw removed, and Min cues provided for single sips, no further signs of aspiration are observed. Pt was engaged and asking questions about the need for recommended strategies, with all questions answered by SLP. Will continue to follow briefly.  Of note, pt today is oriented to location and situation, although he does report that he is "not thinking straight today". Pt may benefit from cognitive evaluation either acutely or upon return to SNF.   HPI Other Pertinent Information: Resident of SNF who sustained Facial trauma s/p fall out of w/c. Facial lacerations repaired in ER. Nondisplaced maxillary and mildly depressed frontal sinus fractures   Pertinent Vitals Pain Assessment: No/denies pain  SLP Plan  Continue with current plan of care    Recommendations Diet recommendations: Dysphagia 3 (mechanical soft);Thin liquid Liquids provided via: Cup;No straw Medication Administration: Whole meds with puree Supervision: Patient able to self feed;Full supervision/cueing for compensatory strategies Compensations: Slow rate;Small sips/bites Postural Changes and/or Swallow Maneuvers: Seated upright 90 degrees;Upright 30-60 min after meal       Oral Care Recommendations: Oral care BID Follow up Recommendations: Skilled Nursing facility Plan: Continue with current plan of care    Maxcine Ham, M.A. CCC-SLP [phone]  Maxcine Ham 09/15/2014, 10:35 AM

## 2014-09-16 DIAGNOSIS — W19XXXD Unspecified fall, subsequent encounter: Secondary | ICD-10-CM

## 2014-09-16 DIAGNOSIS — S0292XD Unspecified fracture of facial bones, subsequent encounter for fracture with routine healing: Secondary | ICD-10-CM

## 2014-09-16 LAB — BASIC METABOLIC PANEL
Anion gap: 12 (ref 5–15)
BUN: 47 mg/dL — ABNORMAL HIGH (ref 6–20)
CHLORIDE: 98 mmol/L — AB (ref 101–111)
CO2: 28 mmol/L (ref 22–32)
Calcium: 8.2 mg/dL — ABNORMAL LOW (ref 8.9–10.3)
Creatinine, Ser: 2.11 mg/dL — ABNORMAL HIGH (ref 0.61–1.24)
GFR, EST AFRICAN AMERICAN: 30 mL/min — AB (ref >60)
GFR, EST NON AFRICAN AMERICAN: 26 mL/min — AB (ref >60)
Glucose, Bld: 157 mg/dL — ABNORMAL HIGH (ref 65–99)
POTASSIUM: 3.8 mmol/L (ref 3.5–5.1)
SODIUM: 138 mmol/L (ref 135–145)

## 2014-09-16 LAB — CBC
HCT: 25 % — ABNORMAL LOW (ref 39.0–52.0)
Hemoglobin: 8.4 g/dL — ABNORMAL LOW (ref 13.0–17.0)
MCH: 30.8 pg (ref 26.0–34.0)
MCHC: 33.6 g/dL (ref 30.0–36.0)
MCV: 91.6 fL (ref 78.0–100.0)
PLATELETS: 107 10*3/uL — AB (ref 150–400)
RBC: 2.73 MIL/uL — AB (ref 4.22–5.81)
RDW: 13.9 % (ref 11.5–15.5)
WBC: 7.1 10*3/uL (ref 4.0–10.5)

## 2014-09-16 LAB — GLUCOSE, CAPILLARY
GLUCOSE-CAPILLARY: 205 mg/dL — AB (ref 65–99)
Glucose-Capillary: 147 mg/dL — ABNORMAL HIGH (ref 65–99)

## 2014-09-16 LAB — OCCULT BLOOD X 1 CARD TO LAB, STOOL: Fecal Occult Bld: NEGATIVE

## 2014-09-16 MED ORDER — SERTRALINE HCL 25 MG PO TABS: 75.0000 mg | ORAL_TABLET | Freq: Every day | ORAL | Status: DC

## 2014-09-16 MED ORDER — ALPRAZOLAM 0.5 MG PO TABS: ORAL_TABLET | ORAL | Status: DC

## 2014-09-16 MED ORDER — TEMAZEPAM 7.5 MG PO CAPS
ORAL_CAPSULE | ORAL | Status: DC
Start: 2014-09-16 — End: 2014-10-12

## 2014-09-16 NOTE — Clinical Social Work Note (Signed)
Clinical Social Work Assessment  Patient Details  Name: [name] MRN: [id number] Date of Birth: 1920-01-03  Date of referral:  09/15/14               Reason for consult:  Facility Placement                Permission sought to share information with:  Family Supports Permission granted to share information::  Yes, Verbal Permission Granted  Name::     [name] patient's daughter  Agency::  [facility]  Relationship::     Contact Information:     Housing/Transportation Living arrangements for the past 2 months:  [place] of Information:  Adult Children, Patient Patient Interpreter Needed:  None Criminal Activity/Legal Involvement Pertinent to Current Situation/Hospitalization:  No - Comment as needed Significant Relationships:  None Lives with:  Facility Resident Do you feel safe going back to the place where you live?  Yes Need for family participation in patient care:  Yes (Comment) (Patient has some dementia family is Media planner.)  Care giving concerns:  Patient is a resident at [facility], family are upset that he had a fall, but they are happy with him returning back to facility.   Social Worker assessment / plan:  CSW met with patient and his daughter who was at bedside.  Patient was alert and oriented x2 and pleasant to talk to.  Patient's daughter reports that patient shares a room with his [facility] at [facility], and he is looking forward to returning.  Patient and family state they have mostly pleased with the care that is being provided.  Patient and family state he has been living at [facility] since 2009, and they like living there.  Patient and his daughter were explained role of CSW and informed that CSW will help facilitate discharge planning.  Patient and his daughter did not have any other questions, and are looking forward to returning to SNF once he is medically ready for discharge.  Employment status:  Retired Radiation protection practitioner:    PT Recommendations:  [place] / Referral to community resources:  [place]  Patient/Family's Response to care:  Patient and family agreeable to returning back to [facility].  Patient/Family's Understanding of and Emotional Response to Diagnosis, Current Treatment, and Prognosis:  Patient and family understand his diagnosis and are aware of his current treatment and prognosis.  Emotional Assessment Appearance:  Appears stated age Attitude/Demeanor/Rapport:    Affect (typically observed):  Accepting, Happy, Pleasant, Calm Orientation:  Oriented to Self, Oriented to Place, Oriented to Situation Alcohol / Substance use:  Not Applicable Psych involvement (Current and /or in the community):  No (Comment)  Discharge Needs  Concerns to be addressed:  No discharge needs identified Readmission within the last 30 days:  No Current discharge risk:  None Barriers to Discharge:  No Barriers Identified   Ross Ludwig, LCSWA 09/16/2014, 2:25 PM

## 2014-09-16 NOTE — Clinical Social Work Note (Signed)
Patient to be d/c'ed today to [facility].  Patient and family agreeable to plans will transport via ems RN to call report.  Patient's daughter Darl Pikes is aware that patient will be transferring back to [facility] today.  Windell Moulding, MSW, Theresia Majors [phone]

## 2014-09-16 NOTE — Discharge Summary (Signed)
Physician Discharge Summary  [name]:[id number] DOB: [birth year] DOA: 09/13/2014  PCP: Oneal Grout, MD  Admit date: 09/13/2014 Discharge date: 09/16/2014  Time spent: 45 minutes  Recommendations for Outpatient Follow-up:  1. Recommend CBC, Chem-12, INR in about one week 2. His diuretics have been held on discharge 3. Recommend weaning off of benzodiazepine medications at night if possible 4. Patient to follow-up with Dr. Sherryl Manges in about 2 weeks for pacemaker recheck 5. Obtain a TSH in about a month  6. Patient has been recommended to follow-up with ENT surgeon Dr. Suszanne Conners in 3-4 days as an OP--nursing home physician to coordinate follow-up with him please 7.  to meds as below Stop Taking on Discharge  - Cyanocobalamin (VITAMIN B-12 IJ) - furosemide (LASIX) 20 MG tablet - lactulose, encephalopathy, (CHRONULAC) 10 GM/15ML SOLN &&&- metolazone (ZAROXOLYN) 2.5 MG tablet - Multiple Vitamins-Minerals (CERTA-VITE PO)  8.  Discharging back to [facility] facility 9. Recommend careful activity at facility  Discharge Diagnoses:  Principal Problem:   Facial fracture due to fall Active Problems:   Complete heart block   SYSTOLIC HEART FAILURE, CHRONIC   Chronic kidney disease, stage III (moderate)   Chronic a-fib   Frontal sinus fracture   Discharge Condition: Improved  Diet recommendation: Heart healthy low-salt--will need a dysphagia 3 diet    Filed Weights   09/13/14 2300 09/14/14 0658 09/16/14 0505  Weight: 95.3 kg (210 lb 1.6 oz) 92.171 kg (203 lb 3.2 oz) 91.38 kg (201 lb 7.3 oz)    History of present illness:  81 y.o. male with a history of atrial fibrillation, chronic systolic heart failure, complete heart block status post pacemaker, dementia, presented to the emergency department after sustaining a fall while on a trip to [place] with his facility group . It seems that patient's wheelchair had "gotten stuck", and patient fell out of the wheelchair hitting  the pavementAnd hit his face   he was found to have facial fractures  Hospital Course:    Facial fracture due to mechanical fall -CT maxillofacial/CT head showed no acute intracranial abnormalities, old lacunar infarct, left frontal scalp and facial hematoma. Right maxillary sinus hematoma, nondisplaced fracture posterior wall of the left frontal sinus. -EDP spoke with Dr. Suszanne Conners and Dr. Renaldo Fiddler who will see the patient. No acute surgical intervention.  ENT, Dr. Suszanne Conners, consulted and appreciated. Will follow up with patient in 1week. Did not recommended continuing antibiotics at discharge. No surgical intervention at this time.  -Continue pain control  -Continue unasyn empirically -Speech recommended dysphagia 3 diet -PT consult pending -OT recommended SNF  Acute on Chronic normocytic anemia secondary to hemodilution  -baseline Hb appears to be between 10-11 -Currently 8.6, possible dilutional component vs blood loss from recent fall and trauma -will continue to monitor CBC closely -Heme occult was negative this admission   History of complete heart block -Patient has pacemaker in place -Outpatient follow-up with Dr. Sherryl Manges for further recommendations   Hypokalemia -Like a secondary diuretics -Resolved, continue to monitor BMP -Metolazone as well as Lasix were discontinued on discharge   Acute on Chronic kidney disease, stage 3 -Creatinine currently 2.09, baseline 1.6 -Will start gentle IVF and continue to monitor BMP -Continue to monitor BMP -Will hold diuretics  indefinitely  -Will not obtain urine electrolytes as patient was on diuretics   Dementia -Mild, not on any medication  Atrial fibrillation -Patient takes Eliquis -Due to fall and current trauma will hold -Given patient's age and fall risk, would  consider stopping medication, discussed with daughter.  -discussed with ENT, recommended resumption if necessary in 3-4 days. -Continue digoxin and  Coreg  Chronic systolic heart failure/ischemic cardiomyopathy  -Hold diuretics and monitor closely -Monitor intake and output, daily weights  -Echocardiogram from 2010 shows an EF of 40-45%   Diabetes mellitus -Continue Lantus, sliding scale, CBG monitoring. -During this hospital stay blood sugar was 150s to 160s  Hypothyroidism -Continue Synthroid  Hyperlipidemia -Continue statin  GERD -Continue Protonix  Insomnia -Continue temazepam -Would not use Ativan in conjunction with and I would recommend that skilled nursing facility physician         Discharge Exam: Filed Vitals:   09/16/14 0505  BP: 106/51  Pulse: 69  Temp: 98.3 F (36.8 C)  Resp: 17    Alert pleasant oriented no apparent distress Face does seem to have some bruising as well as some scarring however this is nonpainful S1-S2 no murmur rub or gallop pacemaker in situ Abdomen soft nontender nondistended no rebound  Discharge Instructions   Discharge Instructions    Diet - low sodium heart healthy    Complete by:  As directed      Increase activity slowly    Complete by:  As directed           Current Discharge Medication List    CONTINUE these medications which have CHANGED   Details  ALPRAZolam (XANAX) 0.5 MG tablet Take one tablet by mouth every night at bedtime for anxiety/insomnia; Take one tablet by mouth once daily as needed for anxiety Qty: 10 tablet, Refills: 0    sertraline (ZOLOFT) 25 MG tablet Take 3 tablets (75 mg total) by mouth daily. For depression Qty: 60 tablet, Refills: 0    temazepam (RESTORIL) 7.5 MG capsule Take one capsule by mouth every night at bedtime for insomnia Qty: 30 capsule, Refills: 0      CONTINUE these medications which have NOT CHANGED   Details  acetaminophen (TYLENOL) 325 MG tablet Take 650 mg by mouth 2 (two) times daily. For osteoarthritic pain    AMBULATORY NON FORMULARY MEDICATION Medication Name: Med Pass 240 mL of fluid by mouth three times  daily    apixaban (ELIQUIS) 2.5 MG TABS tablet Take 2.5 mg by mouth 2 (two) times daily.    carvedilol (COREG) 3.125 MG tablet Take 3.125 mg by mouth 2 (two) times daily with a meal. For HTN    cholecalciferol (VITAMIN D) 1000 UNITS tablet Take 1,000 Units by mouth daily.    clopidogrel (PLAVIX) 75 MG tablet Take 75 mg by mouth daily.      digoxin (LANOXIN) 0.125 MG tablet Take 0.125 mg by mouth daily.   Associated Diagnoses: Atrial fibrillation; Chronic systolic heart failure; Cardiomyopathy, ischemic; Acute on chronic systolic CHF (congestive heart failure); Complete heart block    docusate sodium (COLACE) 100 MG capsule Take 100 mg by mouth 2 (two) times daily. For constipation    insulin glargine (LANTUS) 100 UNIT/ML injection Inject 35 Units into the skin 2 (two) times daily.     insulin lispro (HUMALOG) 100 UNIT/ML injection Inject 5 Units into the skin 3 (three) times daily as needed for high blood sugar (for BS> 200 dl/ml). 5 units subcutaneously three times daily with meals for BS > 200    levothyroxine (SYNTHROID, LEVOTHROID) 100 MCG tablet Take 100 mcg by mouth daily.      loratadine (CLARITIN) 10 MG tablet Take 10 mg by mouth daily as needed for allergies or rhinitis.  Associated Diagnoses: Atrial fibrillation; Chronic systolic heart failure; Cardiomyopathy, ischemic; Acute on chronic systolic CHF (congestive heart failure); Complete heart block    Menthol, Topical Analgesic, 4 % GEL Apply 1 application topically 2 (two) times daily. Apply to posterior neck twice daily for chronic pain    neomycin-polymyxin-pramoxine (NEOSPORIN PLUS) 1 % cream Apply 1 application topically 2 (two) times daily. For right FT/Toe    pantoprazole (PROTONIX) 40 MG tablet Take 20 mg by mouth daily.    Associated Diagnoses: Atrial fibrillation; Chronic systolic heart failure; Cardiomyopathy, ischemic; Acute on chronic systolic CHF (congestive heart failure); Complete heart block    Protein  (PROCEL) POWD Take 1 scoop by mouth 2 (two) times daily. Administer 1 scoop by mouth twice daily for supplement    simvastatin (ZOCOR) 20 MG tablet Take 20 mg by mouth daily at 6 PM. Take 1 tablet by mouth every evening for Hyperlipidemia      STOP taking these medications     Cyanocobalamin (VITAMIN B-12 IJ)      furosemide (LASIX) 20 MG tablet      lactulose, encephalopathy, (CHRONULAC) 10 GM/15ML SOLN      metolazone (ZAROXOLYN) 2.5 MG tablet      Multiple Vitamins-Minerals (CERTA-VITE PO)        Allergies  Allergen Reactions  . Ativan [Lorazepam] Other (See Comments)    unknown  . Codeine Other (See Comments)    unknown  . Promethazine Hcl Other (See Comments)    unknown      The results of significant diagnostics from this hospitalization (including imaging, microbiology, ancillary and laboratory) are listed below for reference.    Significant Diagnostic Studies: Dg Chest 1 View  09/13/2014   CLINICAL DATA:  Fall today.  Fall from wheelchair.  EXAM: CHEST  1 VIEW  COMPARISON:  04/29/2009.  FINDINGS: Cardiomegaly is present. There is no failure. Two lead LEFT subclavian cardiac pacemaker. No airspace disease. No displaced fractures or pneumothorax. No pleural effusion.  IMPRESSION: Cardiomegaly without failure.   Electronically Signed   By: Andreas Newport M.D.   On: 09/13/2014 15:41   Dg Pelvis 1-2 Views  09/13/2014   CLINICAL DATA:  Fall today  EXAM: PELVIS - 1-2 VIEW  COMPARISON:  04/26/2009  FINDINGS: Left hip hemiarthroplasty in satisfactory position and alignment.  Healed fractures of the left acetabulum and left superior and inferior pubic rami.  Negative for acute fracture or dislocation  IMPRESSION: No acute abnormality.   Electronically Signed   By: Marlan Palau M.D.   On: 09/13/2014 15:40   Ct Head Wo Contrast  09/13/2014   CLINICAL DATA:  Larey Seat from wheelchair when chair hit a knee then pavement, no loss of consciousness, frontal puncture wound, LEFT  infraorbital laceration, on blood thinners, history CHF, ischemic cardiomyopathy, coronary artery disease, hypertension, type II diabetes mellitus, former smoker  EXAM: CT HEAD WITHOUT CONTRAST  CT MAXILLOFACIAL WITHOUT CONTRAST  CT CERVICAL SPINE WITHOUT CONTRAST  TECHNIQUE: Multidetector CT imaging of the head, cervical spine, and maxillofacial structures were performed using the standard protocol without intravenous contrast. Multiplanar CT image reconstructions of the cervical spine and maxillofacial structures were also generated. RIGHT-side of face marked with a BB.  COMPARISON:  CT head 12/26/2009  FINDINGS: CT HEAD FINDINGS  Generalized atrophy.  Stable ex vacuo dilatation of the ventricular system.  No midline shift or mass effect.  Small vessel chronic ischemic changes of deep cerebral white matter.  Old RIGHT basal ganglia lacunar infarct.  No intracranial hemorrhage,  mass lesion, or evidence acute infarction.  No extra-axial fluid collections.  LEFT frontal bone fracture, see below.  CT MAXILLOFACIAL FINDINGS  Large scalp hematoma LEFT frontal extending LEFT periorbital, to LEFT nose, and LEFT cheek.  Nasal septal deviation to the LEFT.  Complete opacification of the RIGHT maxillary sinus with high attenuation question blood.  Partial opacification of sphenoid sinus, ethmoid air cells LEFT greater than RIGHT, LEFT frontal sinus, and LEFT maxillary sinus.  Depressed fracture involving the anterior wall of the LEFT frontal sinus, extending into the LEFT orbital roof with associated LEFT orbital pneumatosis.  No definite pneumocephalus is seen though a fracture plane at the posterior wall LEFT frontal sinus traverses the LEFT frontal bone.  Single tiny focus of pneumocephalus at the inferior aspect of the RIGHT orbit, likely nondisplaced anterior RIGHT maxillary wall fracture extending into inferior RIGHT orbital wall.  High attenuation within RIGHT maxillary sinus may represent a hematoma.  Remaining  orbital walls and facial bones appear grossly intact.  CT CERVICAL SPINE FINDINGS  Osseous demineralization.  Visualized skullbase intact.  Multilevel facet degenerative changes.  Diffuse disc space narrowing with endplate spur formation at C5-C6 and C6-C7.  Vertebral body heights maintained without fracture or subluxation.  No bone destruction.  Lung apices clear.  Distended esophagus containing food debris and fluid.  Scattered atherosclerotic calcifications.  IMPRESSION: Generalized atrophy with small vessel chronic ischemic changes of deep cerebral white matter.  Tiny old lacunar infarct RIGHT basal ganglia.  No acute intracranial abnormalities.  Large LEFT facial/frontal scalp hematoma.  Depressed fracture involving anterior wall of LEFT frontal sinus extending into LEFT orbital roof.  Nondisplaced fracture planes involving the posterior wall of the LEFT frontal sinus without definite pneumocephalus or intracranial hematoma.  Probable nondisplaced fracture anterior wall RIGHT maxillary sinus extending to the RIGHT inferior orbital wall with minimal in RIGHT orbital pneumatosis.  Probable RIGHT maxillary sinus hematoma.  Multilevel degenerative disc and facet disease changes of the cervical spine.  No acute cervical spine abnormalities.  Findings called to Dr. Romeo Apple on 09/13/2014 at 1653 hours.   Electronically Signed   By: Ulyses Southward M.D.   On: 09/13/2014 16:54   Ct Cervical Spine Wo Contrast  09/13/2014   CLINICAL DATA:  Larey Seat from wheelchair when chair hit a knee then pavement, no loss of consciousness, frontal puncture wound, LEFT infraorbital laceration, on blood thinners, history CHF, ischemic cardiomyopathy, coronary artery disease, hypertension, type II diabetes mellitus, former smoker  EXAM: CT HEAD WITHOUT CONTRAST  CT MAXILLOFACIAL WITHOUT CONTRAST  CT CERVICAL SPINE WITHOUT CONTRAST  TECHNIQUE: Multidetector CT imaging of the head, cervical spine, and maxillofacial structures were performed  using the standard protocol without intravenous contrast. Multiplanar CT image reconstructions of the cervical spine and maxillofacial structures were also generated. RIGHT-side of face marked with a BB.  COMPARISON:  CT head 12/26/2009  FINDINGS: CT HEAD FINDINGS  Generalized atrophy.  Stable ex vacuo dilatation of the ventricular system.  No midline shift or mass effect.  Small vessel chronic ischemic changes of deep cerebral white matter.  Old RIGHT basal ganglia lacunar infarct.  No intracranial hemorrhage, mass lesion, or evidence acute infarction.  No extra-axial fluid collections.  LEFT frontal bone fracture, see below.  CT MAXILLOFACIAL FINDINGS  Large scalp hematoma LEFT frontal extending LEFT periorbital, to LEFT nose, and LEFT cheek.  Nasal septal deviation to the LEFT.  Complete opacification of the RIGHT maxillary sinus with high attenuation question blood.  Partial opacification of sphenoid sinus, ethmoid air  cells LEFT greater than RIGHT, LEFT frontal sinus, and LEFT maxillary sinus.  Depressed fracture involving the anterior wall of the LEFT frontal sinus, extending into the LEFT orbital roof with associated LEFT orbital pneumatosis.  No definite pneumocephalus is seen though a fracture plane at the posterior wall LEFT frontal sinus traverses the LEFT frontal bone.  Single tiny focus of pneumocephalus at the inferior aspect of the RIGHT orbit, likely nondisplaced anterior RIGHT maxillary wall fracture extending into inferior RIGHT orbital wall.  High attenuation within RIGHT maxillary sinus may represent a hematoma.  Remaining orbital walls and facial bones appear grossly intact.  CT CERVICAL SPINE FINDINGS  Osseous demineralization.  Visualized skullbase intact.  Multilevel facet degenerative changes.  Diffuse disc space narrowing with endplate spur formation at C5-C6 and C6-C7.  Vertebral body heights maintained without fracture or subluxation.  No bone destruction.  Lung apices clear.  Distended  esophagus containing food debris and fluid.  Scattered atherosclerotic calcifications.  IMPRESSION: Generalized atrophy with small vessel chronic ischemic changes of deep cerebral white matter.  Tiny old lacunar infarct RIGHT basal ganglia.  No acute intracranial abnormalities.  Large LEFT facial/frontal scalp hematoma.  Depressed fracture involving anterior wall of LEFT frontal sinus extending into LEFT orbital roof.  Nondisplaced fracture planes involving the posterior wall of the LEFT frontal sinus without definite pneumocephalus or intracranial hematoma.  Probable nondisplaced fracture anterior wall RIGHT maxillary sinus extending to the RIGHT inferior orbital wall with minimal in RIGHT orbital pneumatosis.  Probable RIGHT maxillary sinus hematoma.  Multilevel degenerative disc and facet disease changes of the cervical spine.  No acute cervical spine abnormalities.  Findings called to Dr. Romeo Apple on 09/13/2014 at 1653 hours.   Electronically Signed   By: Ulyses Southward M.D.   On: 09/13/2014 16:54   Dg Hand Complete Left  09/13/2014   CLINICAL DATA:  Fall, bilateral hand pain  EXAM: LEFT HAND - COMPLETE 3+ VIEW  COMPARISON:  None.  FINDINGS: No fracture or dislocation is seen.  The joint spaces are preserved.  The visualized soft tissues are unremarkable.  IMPRESSION: No fracture or dislocation is seen.   Electronically Signed   By: Charline Bills M.D.   On: 09/13/2014 15:41   Dg Hand Complete Right  09/13/2014   CLINICAL DATA:  Larey Seat out of wheelchair today, BILATERAL hand pain  EXAM: RIGHT HAND - COMPLETE 3+ VIEW  COMPARISON:  None  FINDINGS: Osseous mineralization grossly normal for age.  Joint spaces preserved.  Fingers superimposed on lateral view limiting assessment.  No acute fracture, dislocation, or bone destruction.  Small vessel vascular calcification at wrist.  IMPRESSION: No acute osseous abnormalities.   Electronically Signed   By: Ulyses Southward M.D.   On: 09/13/2014 15:41   Ct Maxillofacial  Wo Cm  09/13/2014   CLINICAL DATA:  Larey Seat from wheelchair when chair hit a knee then pavement, no loss of consciousness, frontal puncture wound, LEFT infraorbital laceration, on blood thinners, history CHF, ischemic cardiomyopathy, coronary artery disease, hypertension, type II diabetes mellitus, former smoker  EXAM: CT HEAD WITHOUT CONTRAST  CT MAXILLOFACIAL WITHOUT CONTRAST  CT CERVICAL SPINE WITHOUT CONTRAST  TECHNIQUE: Multidetector CT imaging of the head, cervical spine, and maxillofacial structures were performed using the standard protocol without intravenous contrast. Multiplanar CT image reconstructions of the cervical spine and maxillofacial structures were also generated. RIGHT-side of face marked with a BB.  COMPARISON:  CT head 12/26/2009  FINDINGS: CT HEAD FINDINGS  Generalized atrophy.  Stable ex vacuo dilatation  of the ventricular system.  No midline shift or mass effect.  Small vessel chronic ischemic changes of deep cerebral white matter.  Old RIGHT basal ganglia lacunar infarct.  No intracranial hemorrhage, mass lesion, or evidence acute infarction.  No extra-axial fluid collections.  LEFT frontal bone fracture, see below.  CT MAXILLOFACIAL FINDINGS  Large scalp hematoma LEFT frontal extending LEFT periorbital, to LEFT nose, and LEFT cheek.  Nasal septal deviation to the LEFT.  Complete opacification of the RIGHT maxillary sinus with high attenuation question blood.  Partial opacification of sphenoid sinus, ethmoid air cells LEFT greater than RIGHT, LEFT frontal sinus, and LEFT maxillary sinus.  Depressed fracture involving the anterior wall of the LEFT frontal sinus, extending into the LEFT orbital roof with associated LEFT orbital pneumatosis.  No definite pneumocephalus is seen though a fracture plane at the posterior wall LEFT frontal sinus traverses the LEFT frontal bone.  Single tiny focus of pneumocephalus at the inferior aspect of the RIGHT orbit, likely nondisplaced anterior RIGHT  maxillary wall fracture extending into inferior RIGHT orbital wall.  High attenuation within RIGHT maxillary sinus may represent a hematoma.  Remaining orbital walls and facial bones appear grossly intact.  CT CERVICAL SPINE FINDINGS  Osseous demineralization.  Visualized skullbase intact.  Multilevel facet degenerative changes.  Diffuse disc space narrowing with endplate spur formation at C5-C6 and C6-C7.  Vertebral body heights maintained without fracture or subluxation.  No bone destruction.  Lung apices clear.  Distended esophagus containing food debris and fluid.  Scattered atherosclerotic calcifications.  IMPRESSION: Generalized atrophy with small vessel chronic ischemic changes of deep cerebral white matter.  Tiny old lacunar infarct RIGHT basal ganglia.  No acute intracranial abnormalities.  Large LEFT facial/frontal scalp hematoma.  Depressed fracture involving anterior wall of LEFT frontal sinus extending into LEFT orbital roof.  Nondisplaced fracture planes involving the posterior wall of the LEFT frontal sinus without definite pneumocephalus or intracranial hematoma.  Probable nondisplaced fracture anterior wall RIGHT maxillary sinus extending to the RIGHT inferior orbital wall with minimal in RIGHT orbital pneumatosis.  Probable RIGHT maxillary sinus hematoma.  Multilevel degenerative disc and facet disease changes of the cervical spine.  No acute cervical spine abnormalities.  Findings called to Dr. Romeo Apple on 09/13/2014 at 1653 hours.   Electronically Signed   By: Ulyses Southward M.D.   On: 09/13/2014 16:54    Microbiology: No results found for this or any previous visit (from the past 240 hour(s)).   Labs: Basic Metabolic Panel:  Recent Labs Lab 09/13/14 1404 09/14/14 0409 09/15/14 0308 09/16/14 0309  NA 136 139 139 138  K 3.3* 4.3 3.8 3.8  CL 102 101 101 98*  CO2 24 29 30 28   GLUCOSE 337* 250* 123* 157*  BUN 35* 41* 44* 47*  CREATININE 1.58* 1.80* 2.09* 2.11*  CALCIUM 9.1 8.7*  8.4* 8.2*   Liver Function Tests: No results for input(s): AST, ALT, ALKPHOS, BILITOT, PROT, ALBUMIN in the last 168 hours. No results for input(s): LIPASE, AMYLASE in the last 168 hours. No results for input(s): AMMONIA in the last 168 hours. CBC:  Recent Labs Lab 09/13/14 1404 09/14/14 0409 09/15/14 0308 09/16/14 0309  WBC 9.2 8.8 7.7 7.1  NEUTROABS 6.0  --   --   --   HGB 12.2* 10.2* 8.6* 8.4*  HCT 35.9* 29.9* 25.5* 25.0*  MCV 89.5 90.3 91.7 91.6  PLT 141* 120* 103* 107*   Cardiac Enzymes: No results for input(s): CKTOTAL, CKMB, CKMBINDEX, TROPONINI in the last  168 hours. BNP: BNP (last 3 results) No results for input(s): BNP in the last 8760 hours.  ProBNP (last 3 results) No results for input(s): PROBNP in the last 8760 hours.  CBG:  Recent Labs Lab 09/15/14 0817 09/15/14 1217 09/15/14 1723 09/15/14 1949 09/16/14 0748  GLUCAP 105* 163* 169* 170* 147*       Signed:  Rhetta Mura  [facility] 09/16/2014, 11:43 AM

## 2014-09-19 ENCOUNTER — Other Ambulatory Visit: Payer: Self-pay | Admitting: *Deleted

## 2014-09-19 MED ORDER — TRAMADOL HCL 50 MG PO TABS: ORAL_TABLET | ORAL | Status: DC

## 2014-09-19 NOTE — Telephone Encounter (Signed)
[facility] 

## 2014-09-20 ENCOUNTER — Non-Acute Institutional Stay (SKILLED_NURSING_FACILITY): Payer: Medicare Other | Admitting: Internal Medicine

## 2014-09-20 DIAGNOSIS — S0219XA Other fracture of base of skull, initial encounter for closed fracture: Secondary | ICD-10-CM

## 2014-09-20 DIAGNOSIS — E039 Hypothyroidism, unspecified: Secondary | ICD-10-CM | POA: Diagnosis not present

## 2014-09-20 DIAGNOSIS — D62 Acute posthemorrhagic anemia: Secondary | ICD-10-CM

## 2014-09-20 DIAGNOSIS — R5381 Other malaise: Secondary | ICD-10-CM | POA: Diagnosis not present

## 2014-09-20 DIAGNOSIS — E1129 Type 2 diabetes mellitus with other diabetic kidney complication: Secondary | ICD-10-CM | POA: Diagnosis not present

## 2014-09-20 DIAGNOSIS — K219 Gastro-esophageal reflux disease without esophagitis: Secondary | ICD-10-CM | POA: Diagnosis not present

## 2014-09-20 DIAGNOSIS — IMO0002 Reserved for concepts with insufficient information to code with codable children: Secondary | ICD-10-CM

## 2014-09-20 DIAGNOSIS — I5022 Chronic systolic (congestive) heart failure: Secondary | ICD-10-CM | POA: Diagnosis not present

## 2014-09-20 DIAGNOSIS — E1165 Type 2 diabetes mellitus with hyperglycemia: Secondary | ICD-10-CM

## 2014-09-20 DIAGNOSIS — E785 Hyperlipidemia, unspecified: Secondary | ICD-10-CM | POA: Diagnosis not present

## 2014-09-20 DIAGNOSIS — K5909 Other constipation: Secondary | ICD-10-CM

## 2014-09-20 DIAGNOSIS — I482 Chronic atrial fibrillation, unspecified: Secondary | ICD-10-CM

## 2014-09-20 DIAGNOSIS — I2510 Atherosclerotic heart disease of native coronary artery without angina pectoris: Secondary | ICD-10-CM

## 2014-09-20 DIAGNOSIS — N183 Chronic kidney disease, stage 3 unspecified: Secondary | ICD-10-CM

## 2014-09-20 DIAGNOSIS — K5900 Constipation, unspecified: Secondary | ICD-10-CM

## 2014-09-20 DIAGNOSIS — G4700 Insomnia, unspecified: Secondary | ICD-10-CM

## 2014-09-20 DIAGNOSIS — F418 Other specified anxiety disorders: Secondary | ICD-10-CM | POA: Diagnosis not present

## 2014-09-20 DIAGNOSIS — M179 Osteoarthritis of knee, unspecified: Secondary | ICD-10-CM

## 2014-09-20 DIAGNOSIS — M1710 Unilateral primary osteoarthritis, unspecified knee: Secondary | ICD-10-CM

## 2014-09-20 NOTE — Progress Notes (Signed)
Patient ID: [name], male   DOB: [birth year], 81 y.o.   MRN: [id number]     [facility]   PCP: Oneal Grout, MD  Code Status: full code  Allergies  Allergen Reactions  . Ativan [Lorazepam] Other (See Comments)    unknown  . Codeine Other (See Comments)    unknown  . Promethazine Hcl Other (See Comments)    unknown    Chief Complaint  Patient presents with  . Readmit To SNF     HPI:  81 year old patient is here for long term care post hospital admission from 09/13/14-09/16/14 with facial fracture post fall. No surgical intervention was done. Outpatient ENT follow up in 1 week for left frontal sinus non displaced fracture was recommended.  He has history of afib, chronic systolic heart failure, complete heart block s/p pacemaker, dementia among others. He is seen in his room today. He is in his bed, in no distress and denies any concerns.  Review of Systems:  Constitutional: Negative for fever, chills, diaphoresis.  HENT: Negative for headache, congestion, nasal discharge Eyes: Negative for eye pain, blurred vision, double vision and discharge.  Respiratory: Negative for cough, shortness of breath and wheezing.   Cardiovascular: Negative for chest pain, palpitations, leg swelling.  Gastrointestinal: Negative for heartburn, nausea, vomiting, abdominal pain.  Genitourinary: Negative for dysuria and flank pain.  Musculoskeletal: Negative for back pain, falls Skin: Negative for itching, rash.  Neurological: Negative for dizziness, tingling, focal weakness Psychiatric/Behavioral: Negative for depression   Past Medical History  Diagnosis Date  . Altered mental status     felt to be secondary to acute delirium on top of a suspected underlying dementia  . Acute renal failure     from dehydration brought on by fall  . Pelvic fracture     a. s/p fall  . Depression   . Chronic systolic and diastolic heart failure   . Ischemic  cardiomyopathy   . Coronary artery disease     a. s/p PTCA of LAD 2004  . Hypertension   . Obesity   . Hypothyroidism   . B12 deficiency   . Mild aortic stenosis   . Dementia     Questionable dementia  . Complete heart block     status post dual-chamber pacemaker  . Presence of permanent cardiac pacemaker    Past Surgical History  Procedure Laterality Date  . Cholecystectomy    . Hemiarthroplasty hip Left Aug 07, 2004  . Circumcision  June 21, 2005  . Pacemaker generator change N/A 01/27/2013    Procedure: PACEMAKER GENERATOR CHANGE;  Surgeon: Gardiner Rhyme, MD;  Location: [facility] CATH LAB;  Service: Cardiovascular;  Laterality: N/A;  . Insert / replace / remove pacemaker  2004; 01/2013    MDT pacemaker implanted 2004; gen change by Dr Johney Frame to MDT [id number] 01/2013  . Fracture surgery     Social History:   reports that he has quit smoking. His smoking use included Cigarettes. He has a 54 pack-year smoking history. He has quit using smokeless tobacco. He reports that he does not drink alcohol or use illicit drugs.  No family history on file.  Medications: Patient's Medications  New Prescriptions   No medications on file  Previous Medications   ACETAMINOPHEN (TYLENOL) 325 MG TABLET    Take 650 mg by mouth 2 (two) times daily. For osteoarthritic pain   ALPRAZOLAM (XANAX) 0.5 MG TABLET    Take one tablet by  mouth every night at bedtime for anxiety/insomnia; Take one tablet by mouth once daily as needed for anxiety   AMBULATORY NON FORMULARY MEDICATION    Medication Name: Med Pass 240 mL of fluid by mouth three times daily   APIXABAN (ELIQUIS) 2.5 MG TABS TABLET    Take 2.5 mg by mouth 2 (two) times daily.   CARVEDILOL (COREG) 3.125 MG TABLET    Take 3.125 mg by mouth 2 (two) times daily with a meal. For HTN   CHOLECALCIFEROL (VITAMIN D) 1000 UNITS TABLET    Take 1,000 Units by mouth daily.   CLOPIDOGREL (PLAVIX) 75 MG TABLET    Take 75 mg by mouth daily.     DIGOXIN (LANOXIN) 0.125  MG TABLET    Take 0.125 mg by mouth daily.   DOCUSATE SODIUM (COLACE) 100 MG CAPSULE    Take 100 mg by mouth 2 (two) times daily. For constipation   INSULIN GLARGINE (LANTUS) 100 UNIT/ML INJECTION    Inject 35 Units into the skin 2 (two) times daily.    INSULIN LISPRO (HUMALOG) 100 UNIT/ML INJECTION    Inject 5 Units into the skin 3 (three) times daily as needed for high blood sugar (for BS> 200 dl/ml). 5 units subcutaneously three times daily with meals for BS > 200   LEVOTHYROXINE (SYNTHROID, LEVOTHROID) 100 MCG TABLET    Take 100 mcg by mouth daily.     LORATADINE (CLARITIN) 10 MG TABLET    Take 10 mg by mouth daily as needed for allergies or rhinitis.    MENTHOL, TOPICAL ANALGESIC, 4 % GEL    Apply 1 application topically 2 (two) times daily. Apply to posterior neck twice daily for chronic pain   NEOMYCIN-POLYMYXIN-PRAMOXINE (NEOSPORIN PLUS) 1 % CREAM    Apply 1 application topically 2 (two) times daily. For right FT/Toe   PANTOPRAZOLE (PROTONIX) 40 MG TABLET    Take 20 mg by mouth daily.    PROTEIN (PROCEL) POWD    Take 1 scoop by mouth 2 (two) times daily. Administer 1 scoop by mouth twice daily for supplement   SERTRALINE (ZOLOFT) 25 MG TABLET    Take 3 tablets (75 mg total) by mouth daily. For depression   SIMVASTATIN (ZOCOR) 20 MG TABLET    Take 20 mg by mouth daily at 6 PM. Take 1 tablet by mouth every evening for Hyperlipidemia   TEMAZEPAM (RESTORIL) 7.5 MG CAPSULE    Take one capsule by mouth every night at bedtime for insomnia   TRAMADOL (ULTRAM) 50 MG TABLET    Take one tablet by mouth twice daily as needed for pain  Modified Medications   No medications on file  Discontinued Medications   No medications on file     Physical Exam: Filed Vitals:   09/20/14 1504  BP: 110/63  Pulse: 71  Temp: 97.1 F (36.2 C)  Resp: 18  Height: 6' (1.829 m)  Weight: 201 lb (91.173 kg)  SpO2: 96%   General- elderly male, overweight, in no distress Head- facial brusies and scalp  hematoma Eyes- PERRLA, EOMI, no pallor, no icterus Neck- no lymphadenopathy, no thyromegaly, no jugular vein distension Chest- no chest wall deformities, no chest wall tenderness Cardiovascular- irregular heart rate, no murmurs, pitting 2+ edema in both legs Respiratory- CTAB, no wheeze, no rhonchi, no crackles, no use of accessory muscles Abdomen- bowel sounds present, soft, non tender Musculoskeletal- able to move all 4 extremities, self propels on wheelchair, can transfer, UE strength 5/5 and LE 4/5.  feeble dorsalis pedis, normal capillary refill in foot Neurological- no focal deficit Skin- erythema in both lower extremity, bandaid in right leg, no weeping lesion, bruises on right arm and leg Psychiatry- alert and oriented to person, pleasantly confused   Labs reviewed: Basic Metabolic Panel:  Recent Labs  16/10/96 0409 09/15/14 0308 09/16/14 0309  NA 139 139 138  K 4.3 3.8 3.8  CL 101 101 98*  CO2 29 30 28   GLUCOSE 250* 123* 157*  BUN 41* 44* 47*  CREATININE 1.80* 2.09* 2.11*  CALCIUM 8.7* 8.4* 8.2*   Liver Function Tests:  Recent Labs  05/04/14  AST 14  ALT 13  ALKPHOS 53   No results for input(s): LIPASE, AMYLASE in the last 8760 hours. No results for input(s): AMMONIA in the last 8760 hours. CBC:  Recent Labs  09/13/14 1404 09/14/14 0409 09/15/14 0308 09/16/14 0309  WBC 9.2 8.8 7.7 7.1  NEUTROABS 6.0  --   --   --   HGB 12.2* 10.2* 8.6* 8.4*  HCT 35.9* 29.9* 25.5* 25.0*  MCV 89.5 90.3 91.7 91.6  PLT 141* 120* 103* 107*   Cardiac Enzymes: No results for input(s): CKTOTAL, CKMB, CKMBINDEX, TROPONINI in the last 8760 hours. BNP: Invalid input(s): POCBNP CBG:  Recent Labs  09/15/14 1949 09/16/14 0748 09/16/14 1156  GLUCAP 170* 147* 205*    Radiological Exams:  09/13/14 xray right hand IMPRESSION: No acute osseous abnormalities.   09/13/14 cxr Cardiomegaly with no acute abnormalities  09/13/14 ct head, cervical spine and  sinus IMPRESSION: Generalized atrophy with small vessel chronic ischemic changes of deep cerebral white matter.   Tiny old lacunar infarct RIGHT basal ganglia.   No acute intracranial abnormalities.   Large LEFT facial/frontal scalp hematoma.   Depressed fracture involving anterior wall of LEFT frontal sinus extending into LEFT orbital roof.   Nondisplaced fracture planes involving the posterior wall of the LEFT frontal sinus without definite pneumocephalus or intracranial hematoma.   Probable nondisplaced fracture anterior wall RIGHT maxillary sinus extending to the RIGHT inferior orbital wall with minimal in RIGHT orbital pneumatosis.   Probable RIGHT maxillary sinus hematoma.   Multilevel degenerative disc and facet disease changes of the cervical spine.   No acute cervical spine abnormalities.    Assessment/Plan  Physical deconditioning Post recent hospitalization after a fall. Will have him work with physical therapy and occupational therapy team to help with gait training and muscle strengthening exercises.fall precautions. Skin care. Encourage to be out of bed.   Left frontal sinus fracture Non displaced, will need follow up with ENT in 1 week, continue pain medication as needed  afib Rate controlled. Continue coreg 3.125 mg bid with digoxin 0.125 mg daily. Has CHADS score of 4 but given his dementia and high fall risk, will hold off on anticoagulation for now. Will make cardiology appointment in 1-2 weeks for follow up as well. Start aspirin EC 81 mg daily for now  Chronic systolic heart failure Off lasix with impaired renal function in hospital. Monitor daily weight. Has leg edema. Non complaint with diet, decline anticipatedContinue digoxin 0.125 mg daily, coreg 3.125 mg bid.Not on ACEI with impaired renal function. cmp check 1 week  Anemia likely acute from blood loss post hematoma. Monitor h&h  Dm type 2  Monitor cbg, continue lantus 35 u bid. Check a1c.    Protein calorie malnutrition Continue procel supplement  Hypothyroidism Continue levothyroxine 100 mcg daily  gerd Stable, continue protonix 20 mg daily  ckd stage 3 Monitor  renal function   CAD Remains chest pain free, continue coreg, aspirin and plavix for now  OA Continue tylenol 650 mg bid and biofreeze, continue vitamin d supplement  Hyperlipidemia ldl at goal, continue zocor 20 mg daily  constipation Continue colace 100 mg bid  Depression and anxiety Continue zoloft 75 mg daily and xanax 0.5 mg qhs  Insomnia  Stable, continue restoril 7.5 mg daily    Oneal Grout, MD  [facility] [phone] (Monday-Friday 8 am - 5 pm) [phone] (afterhours)

## 2014-09-27 LAB — BASIC METABOLIC PANEL
BUN: 29 mg/dL — AB (ref 4–21)
Creatinine: 1.6 mg/dL — AB (ref 0.6–1.3)
Glucose: 99 mg/dL
Potassium: 4.1 mmol/L (ref 3.4–5.3)
Sodium: 140 mmol/L (ref 137–147)

## 2014-09-27 LAB — HEPATIC FUNCTION PANEL
ALT: 13 U/L (ref 10–40)
AST: 19 U/L (ref 14–40)
Alkaline Phosphatase: 56 U/L (ref 25–125)
BILIRUBIN, TOTAL: 0.5 mg/dL

## 2014-09-27 LAB — HEMOGLOBIN A1C: Hgb A1c MFr Bld: 7.7 % — AB (ref 4.0–6.0)

## 2014-09-27 LAB — TSH: TSH: 2.72 u[IU]/mL (ref 0.41–5.90)

## 2014-09-28 ENCOUNTER — Ambulatory Visit (INDEPENDENT_AMBULATORY_CARE_PROVIDER_SITE_OTHER): Payer: Medicare Other | Admitting: *Deleted

## 2014-09-28 DIAGNOSIS — I442 Atrioventricular block, complete: Secondary | ICD-10-CM | POA: Diagnosis not present

## 2014-09-28 LAB — CUP PACEART INCLINIC DEVICE CHECK
Battery Remaining Longevity: 134 mo
Date Time Interrogation Session: 20160706160027
Lead Channel Impedance Value: 714 Ohm
Lead Channel Pacing Threshold Amplitude: 0.75 V
Lead Channel Pacing Threshold Pulse Width: 0.4 ms
Lead Channel Setting Pacing Amplitude: 2.5 V
Lead Channel Setting Pacing Pulse Width: 0.4 ms
MDC IDC MSMT BATTERY IMPEDANCE: 103 Ohm
MDC IDC MSMT BATTERY VOLTAGE: 2.79 V
MDC IDC MSMT LEADCHNL RA IMPEDANCE VALUE: 67 Ohm
MDC IDC SET LEADCHNL RV SENSING SENSITIVITY: 4 mV
MDC IDC STAT BRADY RV PERCENT PACED: 100 %

## 2014-09-28 NOTE — Progress Notes (Signed)
Pacemaker check in clinic. Normal device function. Threshold, sensing, impedance consistent with previous measurements. Device programmed to maximize longevity. 3 high ventricular rates noted- longest 6 seconds, pk V 208. Device programmed at appropriate safety margins. Histogram distribution appropriate for patient activity level. Device programmed to optimize intrinsic conduction. Estimated longevity 9-13 years. Pt enrolled in remote follow up. Overdue to see SK- Melissa to call daughter to schedule.

## 2014-10-12 ENCOUNTER — Encounter: Payer: Self-pay | Admitting: Internal Medicine

## 2014-10-12 ENCOUNTER — Non-Acute Institutional Stay (SKILLED_NURSING_FACILITY): Payer: Medicare Other | Admitting: Internal Medicine

## 2014-10-12 DIAGNOSIS — F0150 Vascular dementia without behavioral disturbance: Secondary | ICD-10-CM

## 2014-10-12 DIAGNOSIS — N189 Chronic kidney disease, unspecified: Secondary | ICD-10-CM

## 2014-10-12 DIAGNOSIS — G4700 Insomnia, unspecified: Secondary | ICD-10-CM | POA: Diagnosis not present

## 2014-10-12 DIAGNOSIS — D649 Anemia, unspecified: Secondary | ICD-10-CM | POA: Diagnosis not present

## 2014-10-12 DIAGNOSIS — N183 Chronic kidney disease, stage 3 (moderate): Secondary | ICD-10-CM | POA: Diagnosis not present

## 2014-10-12 DIAGNOSIS — E1122 Type 2 diabetes mellitus with diabetic chronic kidney disease: Secondary | ICD-10-CM | POA: Diagnosis not present

## 2014-10-12 NOTE — Progress Notes (Signed)
Patient ID: BRENAN MODESTO, male   DOB: 1919/05/06, 81 y.o.   MRN: [id number]   [facility]   Code Status:   Allergies  Allergen Reactions  . Ativan [Lorazepam] Other (See Comments)    unknown  . Codeine Other (See Comments)    unknown  . Promethazine Hcl Other (See Comments)    unknown   Chief Complaint  Patient presents with  . Medical Management of Chronic Issues    Routine Visit     HPI 81 y/o male pt is seen for routine visit. He is sitting on his wheelchair, in no distress and denies any concerns today. As per staff, remains non complaint with his diet. Has not eaten his breakfast this am, does not like the taste of it. No falls reported. He has pmh of chf, ckd, HTN, DM, afib, CAD, dementia.  Review of Systems  Constitutional: Negative for fever, chills HENT: Negative for congestion.  Respiratory: Negative for cough, shortness of breath and wheezing.   Cardiovascular: Negative for chest pain, palpitations  Gastrointestinal: Negative for heartburn, nausea, vomiting, abdominal pain  Genitourinary: Negative for dysuria  Neurological: Negative for dizziness  Past medical history reviewed      Medication List       This list is accurate as of: 10/12/14  3:27 PM.  Always use your most recent med list.               acetaminophen 325 MG tablet  Commonly known as:  TYLENOL  Take 650 mg by mouth 2 (two) times daily. For osteoarthritic pain     ALPRAZolam 0.5 MG tablet  Commonly known as:  XANAX  Take one tablet by mouth every night at bedtime for anxiety/insomnia; Take one tablet by mouth once daily as needed for anxiety     carvedilol 3.125 MG tablet  Commonly known as:  COREG  Take 3.125 mg by mouth 2 (two) times daily with a meal. For HTN     cholecalciferol 1000 UNITS tablet  Commonly known as:  VITAMIN D  Take 1,000 Units by mouth daily.     clopidogrel 75 MG tablet  Commonly known as:  PLAVIX  Take 75 mg by mouth daily.     digoxin 0.125 MG tablet  Commonly known as:  LANOXIN  Take 0.125 mg by mouth daily.     docusate sodium 100 MG capsule  Commonly known as:  COLACE  Take 100 mg by mouth 2 (two) times daily. For constipation     furosemide 40 MG tablet  Commonly known as:  LASIX  Take 40 mg by mouth. Daily as needed for swelling, SOB, or if weight > 3# from previous day     insulin glargine 100 UNIT/ML injection  Commonly known as:  LANTUS  Inject 35 Units into the skin 2 (two) times daily.     insulin lispro 100 UNIT/ML injection  Commonly known as:  HUMALOG  Inject 5 Units into the skin 3 (three) times daily as needed for high blood sugar (for BS> 200 dl/ml). 5 units subcutaneously three times daily with meals for BS > 200     levothyroxine 100 MCG tablet  Commonly known as:  SYNTHROID, LEVOTHROID  Take 100 mcg by mouth daily.     Menthol (Topical Analgesic) 4 % Gel  Apply 1 application topically 2 (two) times daily. Apply to posterior neck twice daily for chronic pain     pantoprazole 40 MG tablet  Commonly known as:  PROTONIX  Take 20 mg by mouth daily.     PROCEL Powd  Take 1 scoop by mouth 2 (two) times daily. Administer 1 scoop by mouth twice daily for supplement     sertraline 25 MG tablet  Commonly known as:  ZOLOFT  Take 2 tablets (50 mg total) by mouth daily. For depression     simvastatin 20 MG tablet  Commonly known as:  ZOCOR  Take 20 mg by mouth daily at 6 PM. Take 1 tablet by mouth every evening for Hyperlipidemia     temazepam 7.5 MG capsule  Commonly known as:  RESTORIL  Take one capsule by mouth every night at bedtime as needed for insomnia     UNABLE TO FIND  Med Name: Med Pass 240 mL by mouth three times daily for nutritional supplement        Physical exam BP 140/69 mmHg  Pulse 79  Temp(Src) 97.4 F (36.3 C) (Oral)  Resp 20  SpO2 95%  Wt Readings from Last 3 Encounters:  09/20/14 201 lb (91.173 kg)  09/16/14 201 lb 7.3 oz (91.38 kg)  09/07/14 210 lb  (95.255 kg)   General- elderly male, overweight, in no distress Head- atraumatic, normocephalic Eyes- PERRLA, EOMI, no pallor, no icterus Neck- no lymphadenopathy, no thyromegaly, no jugular vein distension Cardiovascular- irregular heart rate, no murmurs, pitting 1+ edema in both legs Respiratory- CTAB, no wheeze, no rhonchi, no crackles Abdomen- bowel sounds present, soft, non tender Musculoskeletal- able to move all 4 extremities, self propels on wheelchair, can transfer, UE strength 5/5 and LE 4/5. feeble dorsalis pedis, normal capillary refill in foot Neurological- no focal deficit Skin- chronic stasis changes to the legs Psychiatry- alert and oriented to person, pleasantly confused  Labs 09/27/14 wbc 6.6, hb 9.1, hct 27.9, plt 171, na 140, k 4.1, bun 29, cr 1.56, alb 3.3, a1c 7.7 06/08/14 pth 55, ca 8.8 05/20/14 t.chol 104, tg 173, ldl 41, hdl 28, vitamin d 34 05/04/14 wbc 5.7, hb 11.4, plt 113, ca 8.8, na 138, k 4.2, bun 39, cr 1.58, glu 301, a1c 8.6, tsh 3.219, digoxin 1.5  Assessment/plan  Normocytic anemia Low hb. Has impaired renal function. Likely has anemia from chronic disease. Start ferrous sulfate 325 mg po bid for now. Check ferritin and erythropoetin level.   Dm type 2 a1c 7.7 currently on lantus 35 u bid with humalog SSI. Monitor cbg, dietary non compliance makes it difficult for hi sugar to be at control  Insomnia Tolerating restoril change from daily to prn well. Monitor clinically  Vascular dementia Stable, no acute behavioral changes/ monitor clinically, assistance with ADL as needed  ckd stage 3 Monitor renal function. Reviewed recent creatinine   Oneal Grout, MD  [facility] [phone] (Monday-Friday 8 am - 5 pm) [phone] (afterhours)

## 2014-10-20 ENCOUNTER — Other Ambulatory Visit: Payer: Self-pay | Admitting: Internal Medicine

## 2014-10-20 DIAGNOSIS — S0990XS Unspecified injury of head, sequela: Principal | ICD-10-CM

## 2014-10-20 DIAGNOSIS — G44309 Post-traumatic headache, unspecified, not intractable: Secondary | ICD-10-CM

## 2014-10-21 ENCOUNTER — Ambulatory Visit (HOSPITAL_COMMUNITY)
Admission: RE | Admit: 2014-10-21 | Discharge: 2014-10-21 | Disposition: A | Discharge status: Home / Self Care | Payer: Medicare Other | Source: Ambulatory Visit | Attending: Internal Medicine | Admitting: Internal Medicine

## 2014-10-21 DIAGNOSIS — G44309 Post-traumatic headache, unspecified, not intractable: Secondary | ICD-10-CM

## 2014-10-21 DIAGNOSIS — W19XXXD Unspecified fall, subsequent encounter: Secondary | ICD-10-CM | POA: Insufficient documentation

## 2014-10-21 DIAGNOSIS — S0219XD Other fracture of base of skull, subsequent encounter for fracture with routine healing: Secondary | ICD-10-CM | POA: Diagnosis not present

## 2014-10-21 DIAGNOSIS — G319 Degenerative disease of nervous system, unspecified: Secondary | ICD-10-CM | POA: Insufficient documentation

## 2014-10-21 DIAGNOSIS — R51 Headache: Secondary | ICD-10-CM | POA: Diagnosis present

## 2014-10-21 DIAGNOSIS — I739 Peripheral vascular disease, unspecified: Secondary | ICD-10-CM | POA: Diagnosis not present

## 2014-10-21 DIAGNOSIS — R1110 Vomiting, unspecified: Secondary | ICD-10-CM | POA: Insufficient documentation

## 2014-10-21 DIAGNOSIS — S0990XS Unspecified injury of head, sequela: Secondary | ICD-10-CM

## 2014-10-25 LAB — CBC AND DIFFERENTIAL
HCT: 33 % — AB (ref 41–53)
Hemoglobin: 10.4 g/dL — AB (ref 13.5–17.5)
Platelets: 149 10*3/uL — AB (ref 150–399)
WBC: 8.2 10*3/mL

## 2014-10-31 ENCOUNTER — Encounter: Payer: Self-pay | Admitting: Internal Medicine

## 2014-10-31 ENCOUNTER — Non-Acute Institutional Stay (SKILLED_NURSING_FACILITY): Payer: Medicare Other | Admitting: Internal Medicine

## 2014-10-31 DIAGNOSIS — I482 Chronic atrial fibrillation, unspecified: Secondary | ICD-10-CM

## 2014-10-31 DIAGNOSIS — Z950 Presence of cardiac pacemaker: Secondary | ICD-10-CM | POA: Diagnosis not present

## 2014-10-31 DIAGNOSIS — E1151 Type 2 diabetes mellitus with diabetic peripheral angiopathy without gangrene: Secondary | ICD-10-CM | POA: Diagnosis not present

## 2014-10-31 NOTE — Progress Notes (Signed)
Patient ID: [name], male   DOB: [birth year], 81 y.o.   MRN: [id number]      [facility]   Code Status:   Allergies  Allergen Reactions  . Ativan [Lorazepam] Other (See Comments)    unknown  . Codeine Other (See Comments)    unknown  . Promethazine Hcl Other (See Comments)    unknown   Chief Complaint  Patient presents with  . Medical Management of Chronic Issues    HPI 81 y/o male pt is seen for routine visit. He has been at his baseline. He is non compliant with his diet. No falls reported. No acute behavioral concerns. He is sitting on his wheelchair. He has completed a course of antibiotic and prednisone for interstitial pneumonitis. He has pmh of chf, ckd, HTN, DM, afib, CAD, dementia. cbg reading s/o better controlled dm from before.  Review of Systems  Constitutional: Negative for fever, chills HENT: Negative for congestion.  Respiratory: Negative for cough, shortness of breath and wheezing.   Cardiovascular: Negative for chest pain, palpitations  Gastrointestinal: Negative for heartburn, nausea, vomiting, abdominal pain  Genitourinary: Negative for dysuria  Neurological: Negative for dizziness  Past medical history reviewed      Medication List       This list is accurate as of: 10/31/14  2:32 PM.  Always use your most recent med list.               acetaminophen 325 MG tablet  Commonly known as:  TYLENOL  Take 650 mg by mouth 2 (two) times daily. For osteoarthritic pain     ALPRAZolam 0.5 MG tablet  Commonly known as:  XANAX  Take one tablet by mouth every night at bedtime for anxiety/insomnia; Take one tablet by mouth once daily as needed for anxiety     carvedilol 3.125 MG tablet  Commonly known as:  COREG  Take 3.125 mg by mouth 2 (two) times daily with a meal. For HTN     cholecalciferol 1000 UNITS tablet  Commonly known as:  VITAMIN D  Take 1,000 Units by mouth daily.     clopidogrel 75 MG tablet  Commonly known  as:  PLAVIX  Take 75 mg by mouth daily.     digoxin 0.125 MG tablet  Commonly known as:  LANOXIN  Take 0.125 mg by mouth daily.     docusate sodium 100 MG capsule  Commonly known as:  COLACE  Take 100 mg by mouth 2 (two) times daily. For constipation     ferrous sulfate 325 (65 FE) MG tablet  Take 325 mg by mouth 2 (two) times daily with a meal.     furosemide 40 MG tablet  Commonly known as:  LASIX  Take 40 mg by mouth. Daily as needed for swelling, SOB, or if weight > 3# from previous day     insulin glargine 100 UNIT/ML injection  Commonly known as:  LANTUS  Inject 35 Units into the skin 2 (two) times daily.     insulin lispro 100 UNIT/ML injection  Commonly known as:  HUMALOG  Inject 5 Units into the skin 3 (three) times daily as needed for high blood sugar (for BS> 200 dl/ml). 5 units subcutaneously three times daily with meals for BS > 200     levothyroxine 100 MCG tablet  Commonly known as:  SYNTHROID, LEVOTHROID  Take 100 mcg by mouth daily.     Menthol (Topical Analgesic) 4 %  Gel  Apply 1 application topically 2 (two) times daily. Apply to posterior neck twice daily for chronic pain     pantoprazole 40 MG tablet  Commonly known as:  PROTONIX  Take 20 mg by mouth daily.     PROCEL Powd  Take 1 scoop by mouth 2 (two) times daily. Administer 1 scoop by mouth twice daily for supplement     sertraline 25 MG tablet  Commonly known as:  ZOLOFT  Take 2 tablets (50 mg total) by mouth daily. For depression     simvastatin 20 MG tablet  Commonly known as:  ZOCOR  Take 20 mg by mouth daily at 6 PM. Take 1 tablet by mouth every evening for Hyperlipidemia     temazepam 7.5 MG capsule  Commonly known as:  RESTORIL  Take one capsule by mouth every night at bedtime as needed for insomnia     UNABLE TO FIND  Med Name: Med Pass 240 mL by mouth three times daily for nutritional supplement        Physical exam BP 110/60 mmHg  Pulse 74  Temp(Src) 98 F (36.7 C)  Resp  18  Ht  (1.727 m)  Wt 188 lb (85.276 kg)  BMI 28.59 kg/m2  SpO2 96%  Wt Readings from Last 3 Encounters:  10/31/14 188 lb (85.276 kg)  09/20/14 201 lb (91.173 kg)  09/16/14 201 lb 7.3 oz (91.38 kg)   General- elderly male, overweight, in no distress Head- atraumatic, normocephalic Eyes- PERRLA, EOMI, no pallor, no icterus Neck- no lymphadenopathy, no thyromegaly, no jugular vein distension Cardiovascular- irregular heart rate, no murmurs, pitting 1+ edema in both legs Respiratory- CTAB, no wheeze, no rhonchi, no crackles Abdomen- bowel sounds present, soft, non tender Musculoskeletal- able to move all 4 extremities, self propels on wheelchair, can transfer, UE strength 5/5 and LE 4/5. feeble dorsalis pedis, normal capillary refill in foot Neurological- no focal deficit Skin- chronic stasis changes to the legs Psychiatry- alert and oriented to person, pleasantly confused  Labs 09/27/14 wbc 6.6, hb 9.1, hct 27.9, plt 171, na 140, k 4.1, bun 29, cr 1.56, alb 3.3, a1c 7.7 06/08/14 pth 55, ca 8.8 05/20/14 t.chol 104, tg 173, ldl 41, hdl 28, vitamin d 34 05/04/14 wbc 5.7, hb 11.4, plt 113, ca 8.8, na 138, k 4.2, bun 39, cr 1.58, glu 301, a1c 8.6, tsh 3.219, digoxin 1.5 Lab Results  Component Value Date   HGBA1C 7.7* 09/27/2014     Assessment/plan  Type 2 dm with peripheral angiopathy With chronic stasis changes to skin of his legs. a1c 7.7 in July 2016. Continue lantus 35 u bid with humalog SSI and monitor cbg. No wound in legs at present, monitor skin integrity. Continue zocor. Not on ACEI with ckd stage 3  Chronic afib Rate controlled. Continue coreg 3.125 mg bid, digoxin and plavix for now  Presence of cardiac pacemaker Biventricular pacemaker, follow up with cardiology    Oneal Grout, MD  [facility] [phone] (Monday-Friday 8 am - 5 pm) [phone] (afterhours)

## 2014-11-02 ENCOUNTER — Other Ambulatory Visit (HOSPITAL_COMMUNITY): Payer: Self-pay | Admitting: Internal Medicine

## 2014-11-02 DIAGNOSIS — K219 Gastro-esophageal reflux disease without esophagitis: Secondary | ICD-10-CM

## 2014-11-02 DIAGNOSIS — R1314 Dysphagia, pharyngoesophageal phase: Secondary | ICD-10-CM

## 2014-11-09 ENCOUNTER — Other Ambulatory Visit: Payer: Self-pay | Admitting: *Deleted

## 2014-11-09 MED ORDER — ALPRAZOLAM 0.5 MG PO TABS: ORAL_TABLET | ORAL | Status: DC

## 2014-11-10 ENCOUNTER — Ambulatory Visit (HOSPITAL_COMMUNITY)
Admission: RE | Admit: 2014-11-10 | Discharge: 2014-11-10 | Disposition: A | Discharge status: Home / Self Care | Payer: Medicare Other | Source: Ambulatory Visit | Attending: Internal Medicine | Admitting: Internal Medicine

## 2014-11-10 DIAGNOSIS — R1312 Dysphagia, oropharyngeal phase: Secondary | ICD-10-CM | POA: Diagnosis present

## 2014-11-10 DIAGNOSIS — K219 Gastro-esophageal reflux disease without esophagitis: Secondary | ICD-10-CM | POA: Insufficient documentation

## 2014-11-10 DIAGNOSIS — R05 Cough: Secondary | ICD-10-CM | POA: Insufficient documentation

## 2014-11-10 DIAGNOSIS — R1319 Other dysphagia: Secondary | ICD-10-CM | POA: Insufficient documentation

## 2014-11-10 DIAGNOSIS — R1313 Dysphagia, pharyngeal phase: Secondary | ICD-10-CM | POA: Insufficient documentation

## 2014-11-10 DIAGNOSIS — R1311 Dysphagia, oral phase: Secondary | ICD-10-CM | POA: Diagnosis not present

## 2014-11-10 NOTE — Procedures (Signed)
Objective Swallowing Evaluation: Other (Comment)  Patient Details  Name: [name] MRN: [id number] Date of Birth: 01-30-1920  Today's Date: 11/10/2014 Time: SLP Start Time (ACUTE ONLY): 1255-SLP Stop Time (ACUTE ONLY): 1325 SLP Time Calculation (min) (ACUTE ONLY): 30 min  Past Medical History:  Past Medical History  Diagnosis Date  . Altered mental status     felt to be secondary to acute delirium on top of a suspected underlying dementia  . Acute renal failure     from dehydration brought on by fall  . Pelvic fracture     a. s/p fall  . Depression   . Chronic systolic and diastolic heart failure   . Ischemic cardiomyopathy   . Coronary artery disease     a. s/p PTCA of LAD 2004  . Hypertension   . Obesity   . Hypothyroidism   . B12 deficiency   . Mild aortic stenosis   . Dementia     Questionable dementia  . Complete heart block     status post dual-chamber pacemaker  . Presence of permanent cardiac pacemaker    Past Surgical History:  Past Surgical History  Procedure Laterality Date  . Cholecystectomy    . Hemiarthroplasty hip Left Aug 07, 2004  . Circumcision  June 21, 2005  . Pacemaker generator change N/A 01/27/2013    Procedure: PACEMAKER GENERATOR CHANGE;  Surgeon: Gardiner Rhyme, MD;  Location: [facility] CATH LAB;  Service: Cardiovascular;  Laterality: N/A;  . Insert / replace / remove pacemaker  2004; 01/2013    MDT pacemaker implanted 2004; gen change by Dr Johney Frame to MDT [id number] 01/2013  . Fracture surgery     HPI:  Other Pertinent Information: 81 yo male referred for OP MBS due to concerns pt may be aspirating.  Pt is a residen to [employer], prior smoker, OEA, GERD, recent fall from wheelchair with resultant facial fractures (08/2014), Afib, dementia, basal ganglia CVA.  Pt reports he has pna approx 2  years ago but denies recent events.  CT imaging neck showed C5-C6, C6-C7 endplate spurring.  Pt also noted at that time to have a distended fluid/food filled  esophagus.     No Data Recorded  Assessment / Plan / Recommendation CHL IP CLINICAL IMPRESSIONS 11/10/2014  Therapy Diagnosis Mild oral phase dysphagia;Mild pharyngeal phase dysphagia;Suspected primary esophageal dysphagia;Mild cervical esophageal phase dysphagia  Clinical Impression   Pt presents with mild oropharyngeal and cervical esophageal dysphagia and suspected primary esophageal deficits.  No aspiration noted and trace laryngeal penetration of thin *inconsistent* cleared with cued throat clear.  Moderate amount of vallecular residuals wsith pudding/tablet noted due to decreased tongue base retraction that pt did not sense.  Cued dry swallows helpful to decrease residuals.  Barium tablet lodged precariously at vallecular space WITHOUT pt awareness.    Of note, pt was talking while masticated cracker bolus was transiting into pharynx - cues to cease talking required.  Cough x1 noted during MBS without barium visualized in larynx.   Suspect primary esophageal deficits based on findings of  appearance of minimal backflow of nectar thick into pyriform sinus without pt awareness.  Distal appearance of slow clearance noted with retrograde propulsion without pt awareness.  Also note per prior imaging study in 08/2013 - pt had a fluid and air filled esophagus at that time.    Recommend follow up with SLP for maximizing oropharyngeal swallow ability.  Using teach back, educated pt and daughter Fannie Knee to findings/recommendations.   Thanks for  this consult.           CHL IP DIET RECOMMENDATION 11/10/2014  SLP Diet Recommendations Dysphagia 3 (Mech soft);Thin  Liquid Administration via Cup, straw  Medication Administration Whole meds with puree - follow with liquids  Compensations Small sips/bites;Multiple dry swallows after each bite/sip;Clear throat intermittently  Postural Changes and/or Swallow Maneuvers Reflux precautions     CHL IP OTHER RECOMMENDATIONS 11/10/2014  Recommended Consults Consider  esophageal assessment  Oral Care Recommendations Oral care BID  Other Recommendations Clarify dietary restrictions     CHL IP FOLLOW UP RECOMMENDATIONS 09/15/2014  Follow up Recommendations Skilled Nursing facility     [facility] IP FREQUENCY AND DURATION 09/14/2014  Speech Therapy Frequency (ACUTE ONLY) min 2x/week  Treatment Duration 1 week         CHL IP REASON FOR REFERRAL 11/10/2014  Reason for Referral Objectively evaluate swallowing function     CHL IP ORAL PHASE 11/10/2014  Oral Phase Impaired      CHL IP PHARYNGEAL PHASE 11/10/2014  Pharyngeal Phase Impaired  Pharyngeal Comment Cued dry swallows helpful to decrease vallecular residuals that pt does not sense.  cued throat clear removed trace penetration.        CHL IP CERVICAL ESOPHAGEAL PHASE 11/10/2014  Cervical Esophageal Phase Impaired  Nectar Teaspoon Reduced cricopharyngeal relaxation;Esophageal backflow into the pharynx  Nectar Cup Reduced cricopharyngeal relaxation  Thin Teaspoon Reduced cricopharyngeal relaxation  Thin Cup Reduced cricopharyngeal relaxation  Thin Straw Reduced cricopharyngeal relaxation  Cervical Esophageal Comment appearance of minimal backflow of nectar thick into pyriform sinus without pt awareness.  Distal appearance of slow clearance noted with retrograde propulsion without pt awareness.     CHL IP GO 11/10/2014  Functional Assessment Tool Used MBS, clinical judgement  Functional Limitations Swallowing  Swallow Current Status (N3614) CJ  Swallow Goal Status (E3154) CJ  Swallow Discharge Status [phone]) Jen Mow, MS [facility] SLP [phone]

## 2014-12-12 ENCOUNTER — Non-Acute Institutional Stay (SKILLED_NURSING_FACILITY): Payer: Medicare Other | Admitting: Internal Medicine

## 2014-12-12 DIAGNOSIS — I130 Hypertensive heart and chronic kidney disease with heart failure and stage 1 through stage 4 chronic kidney disease, or unspecified chronic kidney disease: Secondary | ICD-10-CM | POA: Diagnosis not present

## 2014-12-12 DIAGNOSIS — E441 Mild protein-calorie malnutrition: Secondary | ICD-10-CM | POA: Diagnosis not present

## 2014-12-12 DIAGNOSIS — I1310 Hypertensive heart and chronic kidney disease without heart failure, with stage 1 through stage 4 chronic kidney disease, or unspecified chronic kidney disease: Secondary | ICD-10-CM | POA: Insufficient documentation

## 2014-12-12 DIAGNOSIS — K219 Gastro-esophageal reflux disease without esophagitis: Secondary | ICD-10-CM

## 2014-12-12 DIAGNOSIS — E785 Hyperlipidemia, unspecified: Secondary | ICD-10-CM

## 2014-12-12 DIAGNOSIS — E038 Other specified hypothyroidism: Secondary | ICD-10-CM | POA: Diagnosis not present

## 2014-12-12 LAB — LIPID PANEL
Cholesterol: 106 mg/dL (ref 0–200)
HDL: 27 mg/dL — AB (ref 35–70)
LDL CALC: 51 mg/dL
Triglycerides: 139 mg/dL (ref 40–160)

## 2014-12-12 NOTE — Progress Notes (Signed)
Patient ID: [name], male   DOB: April 09, 1919, 81 y.o.   MRN: [id number]     [facility]   Code Status:   Allergies  Allergen Reactions  . Ativan [Lorazepam] Other (See Comments)    unknown  . Codeine Other (See Comments)    unknown  . Promethazine Hcl Other (See Comments)    unknown   Chief Complaint  Patient presents with  . Medical Management of Chronic Issues    HPI 81 y/o male pt is seen for routine visit. He hit his foot against his wheelchair and has bruised his toe as per staff, no skin breakdown. His bp reading appears stable. His heart rate is controlled. Continues to take his synthroid. He has been at his baseline. He is non compliant with his diet. No falls reported. No acute behavioral concerns. Currently on protein supplements. Gained 9 lbs since last month's visit.  Review of Systems  Constitutional: Negative for fever, chills HENT: Negative for congestion.  Respiratory: Negative for cough, shortness of breath and wheezing.   Cardiovascular: Negative for chest pain, palpitations  Gastrointestinal: Negative for heartburn, nausea, vomiting, abdominal pain  Genitourinary: Negative for dysuria  Neurological: Negative for dizziness  Past medical history reviewed      Medication List       This list is accurate as of: 12/12/14  5:53 PM.  Always use your most recent med list.               acetaminophen 325 MG tablet  Commonly known as:  TYLENOL  Take 650 mg by mouth 2 (two) times daily. For osteoarthritic pain     ALPRAZolam 0.5 MG tablet  Commonly known as:  XANAX  Take one tablet by mouth every night at bedtime for anxiety/insomnia; Take one tablet by mouth once daily as needed for anxiety     carvedilol 3.125 MG tablet  Commonly known as:  COREG  Take 3.125 mg by mouth 2 (two) times daily with a meal. For HTN     cholecalciferol 1000 UNITS tablet  Commonly known as:  VITAMIN D  Take 1,000 Units by mouth daily.     clopidogrel 75 MG tablet  Commonly known as:  PLAVIX  Take 75 mg by mouth daily.     digoxin 0.125 MG tablet  Commonly known as:  LANOXIN  Take 0.125 mg by mouth daily.     docusate sodium 100 MG capsule  Commonly known as:  COLACE  Take 100 mg by mouth 2 (two) times daily. For constipation     ferrous sulfate 325 (65 FE) MG tablet  Take 325 mg by mouth 2 (two) times daily with a meal.     furosemide 40 MG tablet  Commonly known as:  LASIX  Take 40 mg by mouth. Daily as needed for swelling, SOB, or if weight > 3# from previous day     insulin glargine 100 UNIT/ML injection  Commonly known as:  LANTUS  Inject 35 Units into the skin 2 (two) times daily.     insulin lispro 100 UNIT/ML injection  Commonly known as:  HUMALOG  Inject 5 Units into the skin 3 (three) times daily as needed for high blood sugar (for BS> 200 dl/ml). 5 units subcutaneously three times daily with meals for BS > 200     levothyroxine 100 MCG tablet  Commonly known as:  SYNTHROID, LEVOTHROID  Take 100 mcg by mouth daily.  Menthol (Topical Analgesic) 4 % Gel  Apply 1 application topically 2 (two) times daily. Apply to posterior neck twice daily for chronic pain     pantoprazole 40 MG tablet  Commonly known as:  PROTONIX  Take 20 mg by mouth daily.     PROCEL Powd  Take 1 scoop by mouth 2 (two) times daily. Administer 1 scoop by mouth twice daily for supplement     sertraline 25 MG tablet  Commonly known as:  ZOLOFT  Take 2 tablets (50 mg total) by mouth daily. For depression     simvastatin 20 MG tablet  Commonly known as:  ZOCOR  Take 20 mg by mouth daily at 6 PM. Take 1 tablet by mouth every evening for Hyperlipidemia     temazepam 7.5 MG capsule  Commonly known as:  RESTORIL  Take one capsule by mouth every night at bedtime as needed for insomnia     UNABLE TO FIND  Med Name: Med Pass 240 mL by mouth three times daily for nutritional supplement        Physical exam BP 126/62 mmHg   Pulse 72  Temp(Src) 98 F (36.7 C)  Resp 18  Wt 197 lb 3.2 oz (89.449 kg)  SpO2 94%  Wt Readings from Last 3 Encounters:  12/12/14 197 lb 3.2 oz (89.449 kg)  10/31/14 188 lb (85.276 kg)  09/20/14 201 lb (91.173 kg)   General- elderly male, overweight, in no distress Head- atraumatic, normocephalic Eyes- PERRLA, EOMI, no pallor, no icterus Neck- no lymphadenopathy, no thyromegaly, no jugular vein distension Cardiovascular- irregular heart rate, no murmurs, pitting 1+ edema in both legs Respiratory- CTAB, no wheeze, no rhonchi, no crackles Abdomen- bowel sounds present, soft, non tender Musculoskeletal- able to move all 4 extremities, self propels on wheelchair, can transfer, UE strength 5/5 and LE 4/5. feeble dorsalis pedis, normal capillary refill in foot Neurological- no focal deficit Skin- chronic stasis changes to the legs, bruising to his toe with skin intact Psychiatry- alert and oriented to person, pleasantly confused  Labs 10/28/14 iron 95, b12 485, tibc normal, folate normal 09/27/14 wbc 6.6, hb 9.1, hct 27.9, plt 171, na 140, k 4.1, bun 29, cr 1.56, alb 3.3, a1c 7.7 06/08/14 pth 55, ca 8.8 05/20/14 t.chol 104, tg 173, ldl 41, hdl 28, vitamin d 34 05/04/14 wbc 5.7, hb 11.4, plt 113, ca 8.8, na 138, k 4.2, bun 39, cr 1.58, glu 301, a1c 8.6, tsh 3.219, digoxin 1.5   Assessment/plan  Mild protein calorie malnutrition Continue procel, has gained back weight. Check daily weight  Hypertensive heart and renal disease Stable bp, continue coreg 3.125 mg bid, on lasix 40 mg daily prn. Change this to 20 mg po daily for now and reassess. Check bmp in 1 week  Hypothyroidism Continue levothyroxine 100 mcg daily, tsh 7/16 2.717, monitor clinically for now  gerd Stable, decrease protonix to 20 mg daily and monitor  Hyperlipidemia On zocor 20 mg daily, check lipid panel   Oneal Grout, MD  [facility] [phone] (Monday-Friday 8 am - 5 pm) [phone]  (afterhours)

## 2015-01-09 ENCOUNTER — Encounter: Payer: Self-pay | Admitting: Internal Medicine

## 2015-01-09 ENCOUNTER — Non-Acute Institutional Stay (SKILLED_NURSING_FACILITY): Payer: Medicare Other | Admitting: Internal Medicine

## 2015-01-09 DIAGNOSIS — E785 Hyperlipidemia, unspecified: Secondary | ICD-10-CM

## 2015-01-09 DIAGNOSIS — E1122 Type 2 diabetes mellitus with diabetic chronic kidney disease: Secondary | ICD-10-CM

## 2015-01-09 DIAGNOSIS — I739 Peripheral vascular disease, unspecified: Secondary | ICD-10-CM

## 2015-01-09 DIAGNOSIS — Z794 Long term (current) use of insulin: Secondary | ICD-10-CM | POA: Diagnosis not present

## 2015-01-09 DIAGNOSIS — E1165 Type 2 diabetes mellitus with hyperglycemia: Secondary | ICD-10-CM | POA: Diagnosis not present

## 2015-01-09 NOTE — Progress Notes (Signed)
Patient ID: [name], male   DOB: 1919/08/28, 81 y.o.   MRN: [id number]     [facility]   Code Status: Full Code   Allergies  Allergen Reactions  . Ativan [Lorazepam] Other (See Comments)    unknown  . Codeine Other (See Comments)    unknown  . Promethazine Hcl Other (See Comments)    unknown   Chief Complaint  Patient presents with  . Medical Management of Chronic Issues    Routine visit     HPI 81 y/o male pt is seen for routine visit. He has been at his baseline. He is non compliant with his diet. No falls reported. No acute behavioral concerns.   Review of Systems  Constitutional: Negative for fever, chills HENT: Negative for congestion.  Respiratory: Negative for cough, shortness of breath and wheezing.   Cardiovascular: Negative for chest pain, palpitations  Gastrointestinal: Negative for heartburn, nausea, vomiting, abdominal pain  Genitourinary: Negative for dysuria  Neurological: Negative for dizziness  Past medical history reviewed      Medication List       This list is accurate as of: 01/09/15  3:31 PM.  Always use your most recent med list.               acetaminophen 325 MG tablet  Commonly known as:  TYLENOL  Take 650 mg by mouth 2 (two) times daily. For osteoarthritic pain     ALPRAZolam 0.5 MG tablet  Commonly known as:  XANAX  Take one tablet by mouth every night at bedtime for anxiety/insomnia; Take one tablet by mouth once daily as needed for anxiety     aspirin EC 81 MG tablet  Take 81 mg by mouth daily.     carvedilol 3.125 MG tablet  Commonly known as:  COREG  Take 3.125 mg by mouth 2 (two) times daily with a meal. For HTN     cholecalciferol 1000 UNITS tablet  Commonly known as:  VITAMIN D  Take 1,000 Units by mouth daily.     clopidogrel 75 MG tablet  Commonly known as:  PLAVIX  Take 75 mg by mouth daily.     digoxin 0.125 MG tablet  Commonly known as:  LANOXIN  Take 0.125 mg by mouth daily.       docusate sodium 100 MG capsule  Commonly known as:  COLACE  Take 100 mg by mouth 2 (two) times daily. For constipation     ferrous sulfate 325 (65 FE) MG tablet  Take 325 mg by mouth 2 (two) times daily with a meal.     furosemide 40 MG tablet  Commonly known as:  LASIX  Take 40 mg by mouth. Daily as needed for swelling, SOB, or if weight > 3# from previous day     insulin glargine 100 UNIT/ML injection  Commonly known as:  LANTUS  Inject 35 Units into the skin 2 (two) times daily.     insulin lispro 100 UNIT/ML injection  Commonly known as:  HUMALOG  Inject 5 Units into the skin 3 (three) times daily as needed for high blood sugar (for BS> 200 dl/ml). 5 units subcutaneously three times daily with meals for BS > 200     ipratropium-albuterol 0.5-2.5 (3) MG/3ML Soln  Commonly known as:  DUONEB  Take 3 mLs by nebulization every 6 (six) hours as needed.     levothyroxine 100 MCG tablet  Commonly known as:  SYNTHROID, LEVOTHROID  Take 100 mcg by mouth daily.     Menthol (Topical Analgesic) 4 % Gel  Apply 1 application topically 2 (two) times daily. Apply to posterior neck twice daily for chronic pain     pantoprazole 20 MG tablet  Commonly known as:  PROTONIX  Take 20 mg by mouth daily.     sertraline 25 MG tablet  Commonly known as:  ZOLOFT  Take 75 mg by mouth daily. For depression     simvastatin 20 MG tablet  Commonly known as:  ZOCOR  Take 20 mg by mouth daily at 6 PM. Take 1 tablet by mouth every evening for Hyperlipidemia     temazepam 7.5 MG capsule  Commonly known as:  RESTORIL  Take one capsule by mouth every night at bedtime as needed for insomnia     UNABLE TO FIND  Med Name: Med Pass 240 mL by mouth three times daily for nutritional supplement        Physical exam BP 111/58 mmHg  Pulse 71  Temp(Src) 98.7 F (37.1 C) (Oral)  Resp 20  Ht  (1.727 m)  Wt 197 lb 6.4 oz (89.54 kg)  BMI 30.02 kg/m2  SpO2 93%  Wt Readings from Last 3  Encounters:  01/09/15 197 lb 6.4 oz (89.54 kg)  12/12/14 197 lb 3.2 oz (89.449 kg)  10/31/14 188 lb (85.276 kg)   General- elderly male, obese, in no distress Head- atraumatic, normocephalic Eyes- PERRLA, EOMI, no pallor, no icterus Neck- no lymphadenopathy, no thyromegaly, no jugular vein distension Cardiovascular- irregular heart rate, no murmurs, pitting 1+ edema in both legs Respiratory- CTAB, no wheeze, no rhonchi, no crackles Abdomen- bowel sounds present, soft, non tender Musculoskeletal- able to move all 4 extremities, self propels on wheelchair, can transfer, UE strength 5/5 and LE 4/5. feeble dorsalis pedis, normal capillary refill in foot Neurological- no focal deficit Skin- chronic stasis changes to the legs, bruising to several toes with some scab with skin intact Psychiatry- alert and oriented to person, pleasantly confused  Labs  CBC Latest Ref Rng 10/25/2014 09/16/2014 09/15/2014  WBC - 8.2 7.1 7.7  Hemoglobin 13.5 - 17.5 g/dL 10.4(A) 8.4(L) 8.6(L)  Hematocrit 41 - 53 % 33(A) 25.0(L) 25.5(L)  Platelets 150 - 399 K/L 149(A) 107(L) 103(L)   Lipid Panel     Component Value Date/Time   CHOL 106 12/12/2014   TRIG 139 12/12/2014   HDL 27* 12/12/2014   CHOLHDL 7.9 09/13/2006 0403   VLDL 33 09/13/2006 0403   LDLCALC 51 12/12/2014   Lab Results  Component Value Date   HGBA1C 7.7* 09/27/2014    10/28/14 iron 95, b12 485, tibc normal, folate normal 09/27/14 wbc 6.6, hb 9.1, hct 27.9, plt 171, na 140, k 4.1, bun 29, cr 1.56, alb 3.3, a1c 7.7 06/08/14 pth 55, ca 8.8 05/20/14 t.chol 104, tg 173, ldl 41, hdl 28, vitamin d 34 05/04/14 wbc 5.7, hb 11.4, plt 113, ca 8.8, na 138, k 4.2, bun 39, cr 1.58, glu 301, a1c 8.6, tsh 3.219, digoxin 1.5   Assessment/plan  Dm type 2 a1c from July reviewed. Has gained weight and is non compliant with his diet. Check a1c. Continue lantus 35 u bid with SSI with meals. Monitor cbg. Continue asa and zocor  HLD Reviewed lipid panel, ldl at  goal, decrease zocor to 10 mg daily  PVD Has chronic stasis changes and is on lasix 40 mg daily prn, was changed to 20 mg daily last visit but no changes  made in [facility]. Will do lasix 20 mg daily for now with weight check. Monitor clinically. With poor distal pulse and wound with scab on toes, get ABI to evaluate further    Oneal Grout, MD  [facility] [phone] (Monday-Friday 8 am - 5 pm) [phone] (afterhours)

## 2015-01-11 ENCOUNTER — Other Ambulatory Visit: Payer: Self-pay | Admitting: *Deleted

## 2015-01-11 MED ORDER — ALPRAZOLAM 0.5 MG PO TABS: ORAL_TABLET | ORAL | Status: AC

## 2015-01-24 ENCOUNTER — Encounter: Payer: Self-pay | Admitting: *Deleted

## 2015-02-20 ENCOUNTER — Inpatient Hospital Stay (HOSPITAL_COMMUNITY)
Admission: EM | Admit: 2015-02-20 | Discharge: 2015-02-23 | DRG: 208 | Disposition: E | Payer: Medicare Other | Attending: Pulmonary Disease | Admitting: Pulmonary Disease

## 2015-02-20 ENCOUNTER — Encounter (HOSPITAL_COMMUNITY): Payer: Self-pay | Admitting: Emergency Medicine

## 2015-02-20 ENCOUNTER — Emergency Department (HOSPITAL_COMMUNITY): Payer: Medicare Other

## 2015-02-20 DIAGNOSIS — R402122 Coma scale, eyes open, to pain, at arrival to emergency department: Secondary | ICD-10-CM | POA: Diagnosis present

## 2015-02-20 DIAGNOSIS — Z888 Allergy status to other drugs, medicaments and biological substances status: Secondary | ICD-10-CM | POA: Diagnosis not present

## 2015-02-20 DIAGNOSIS — Z79899 Other long term (current) drug therapy: Secondary | ICD-10-CM

## 2015-02-20 DIAGNOSIS — Z794 Long term (current) use of insulin: Secondary | ICD-10-CM | POA: Diagnosis not present

## 2015-02-20 DIAGNOSIS — E872 Acidosis, unspecified: Secondary | ICD-10-CM

## 2015-02-20 DIAGNOSIS — G9341 Metabolic encephalopathy: Secondary | ICD-10-CM | POA: Diagnosis present

## 2015-02-20 DIAGNOSIS — I350 Nonrheumatic aortic (valve) stenosis: Secondary | ICD-10-CM | POA: Diagnosis present

## 2015-02-20 DIAGNOSIS — I5043 Acute on chronic combined systolic (congestive) and diastolic (congestive) heart failure: Secondary | ICD-10-CM | POA: Diagnosis present

## 2015-02-20 DIAGNOSIS — Z7982 Long term (current) use of aspirin: Secondary | ICD-10-CM | POA: Diagnosis not present

## 2015-02-20 DIAGNOSIS — J810 Acute pulmonary edema: Secondary | ICD-10-CM

## 2015-02-20 DIAGNOSIS — R402222 Coma scale, best verbal response, incomprehensible words, at arrival to emergency department: Secondary | ICD-10-CM | POA: Diagnosis present

## 2015-02-20 DIAGNOSIS — I501 Left ventricular failure: Secondary | ICD-10-CM

## 2015-02-20 DIAGNOSIS — N170 Acute kidney failure with tubular necrosis: Secondary | ICD-10-CM | POA: Diagnosis present

## 2015-02-20 DIAGNOSIS — Z515 Encounter for palliative care: Secondary | ICD-10-CM | POA: Diagnosis present

## 2015-02-20 DIAGNOSIS — I2510 Atherosclerotic heart disease of native coronary artery without angina pectoris: Secondary | ICD-10-CM | POA: Diagnosis present

## 2015-02-20 DIAGNOSIS — J9601 Acute respiratory failure with hypoxia: Secondary | ICD-10-CM | POA: Diagnosis present

## 2015-02-20 DIAGNOSIS — R402362 Coma scale, best motor response, obeys commands, at arrival to emergency department: Secondary | ICD-10-CM | POA: Diagnosis present

## 2015-02-20 DIAGNOSIS — R570 Cardiogenic shock: Secondary | ICD-10-CM | POA: Diagnosis present

## 2015-02-20 DIAGNOSIS — I248 Other forms of acute ischemic heart disease: Secondary | ICD-10-CM | POA: Diagnosis present

## 2015-02-20 DIAGNOSIS — I959 Hypotension, unspecified: Secondary | ICD-10-CM

## 2015-02-20 DIAGNOSIS — Z66 Do not resuscitate: Secondary | ICD-10-CM | POA: Diagnosis present

## 2015-02-20 DIAGNOSIS — E039 Hypothyroidism, unspecified: Secondary | ICD-10-CM | POA: Diagnosis present

## 2015-02-20 DIAGNOSIS — I255 Ischemic cardiomyopathy: Secondary | ICD-10-CM | POA: Diagnosis present

## 2015-02-20 DIAGNOSIS — F329 Major depressive disorder, single episode, unspecified: Secondary | ICD-10-CM | POA: Diagnosis present

## 2015-02-20 DIAGNOSIS — E538 Deficiency of other specified B group vitamins: Secondary | ICD-10-CM | POA: Diagnosis present

## 2015-02-20 DIAGNOSIS — Z978 Presence of other specified devices: Secondary | ICD-10-CM

## 2015-02-20 DIAGNOSIS — Z950 Presence of cardiac pacemaker: Secondary | ICD-10-CM

## 2015-02-20 DIAGNOSIS — Z7902 Long term (current) use of antithrombotics/antiplatelets: Secondary | ICD-10-CM | POA: Diagnosis not present

## 2015-02-20 DIAGNOSIS — Z885 Allergy status to narcotic agent status: Secondary | ICD-10-CM

## 2015-02-20 DIAGNOSIS — I110 Hypertensive heart disease with heart failure: Secondary | ICD-10-CM | POA: Diagnosis present

## 2015-02-20 DIAGNOSIS — Z87891 Personal history of nicotine dependence: Secondary | ICD-10-CM | POA: Diagnosis not present

## 2015-02-20 DIAGNOSIS — F0390 Unspecified dementia without behavioral disturbance: Secondary | ICD-10-CM | POA: Diagnosis present

## 2015-02-20 DIAGNOSIS — J9691 Respiratory failure, unspecified with hypoxia: Secondary | ICD-10-CM | POA: Diagnosis present

## 2015-02-20 DIAGNOSIS — E119 Type 2 diabetes mellitus without complications: Secondary | ICD-10-CM | POA: Diagnosis present

## 2015-02-20 LAB — URINALYSIS, ROUTINE W REFLEX MICROSCOPIC
GLUCOSE, UA: NEGATIVE mg/dL
Hgb urine dipstick: NEGATIVE
Ketones, ur: 15 mg/dL — AB
Nitrite: NEGATIVE
PH: 5 (ref 5.0–8.0)
PROTEIN: 100 mg/dL — AB
Specific Gravity, Urine: 1.022 (ref 1.005–1.030)

## 2015-02-20 LAB — I-STAT ARTERIAL BLOOD GAS, ED
Acid-base deficit: 4 mmol/L — ABNORMAL HIGH (ref 0.0–2.0)
Acid-base deficit: 6 mmol/L — ABNORMAL HIGH (ref 0.0–2.0)
Acid-base deficit: 6 mmol/L — ABNORMAL HIGH (ref 0.0–2.0)
BICARBONATE: 22.1 meq/L (ref 20.0–24.0)
BICARBONATE: 22.3 meq/L (ref 20.0–24.0)
Bicarbonate: 21.2 mEq/L (ref 20.0–24.0)
O2 Saturation: 80 %
O2 Saturation: 84 %
O2 Saturation: 78 %
PCO2 ART: 45.4 mmHg — AB (ref 35.0–45.0)
PCO2 ART: 54.1 mmHg — AB (ref 35.0–45.0)
PCO2 ART: 48.5 mmHg — AB (ref 35.0–45.0)
PH ART: 7.253 — AB (ref 7.350–7.450)
PO2 ART: 61 mmHg — AB (ref 80.0–100.0)
Patient temperature: 99.3
Patient temperature: 99.7
TCO2: 23 mmol/L (ref 0–100)
TCO2: 24 mmol/L (ref 0–100)
TCO2: 23 mmol/L (ref 0–100)
pH, Arterial: 7.298 — ABNORMAL LOW (ref 7.350–7.450)
pH, Arterial: 7.227 — ABNORMAL LOW (ref 7.350–7.450)
pO2, Arterial: 51 mmHg — ABNORMAL LOW (ref 80.0–100.0)
pO2, Arterial: 52 mmHg — ABNORMAL LOW (ref 80.0–100.0)

## 2015-02-20 LAB — COMPREHENSIVE METABOLIC PANEL
ALBUMIN: 3 g/dL — AB (ref 3.5–5.0)
ALT: 45 U/L (ref 17–63)
ANION GAP: 12 (ref 5–15)
AST: 57 U/L — ABNORMAL HIGH (ref 15–41)
Alkaline Phosphatase: 57 U/L (ref 38–126)
BILIRUBIN TOTAL: 1 mg/dL (ref 0.3–1.2)
BUN: 72 mg/dL — ABNORMAL HIGH (ref 6–20)
CO2: 23 mmol/L (ref 22–32)
Calcium: 8.7 mg/dL — ABNORMAL LOW (ref 8.9–10.3)
Chloride: 108 mmol/L (ref 101–111)
Creatinine, Ser: 3.49 mg/dL — ABNORMAL HIGH (ref 0.61–1.24)
GFR calc Af Amer: 16 mL/min — ABNORMAL LOW (ref >60)
GFR calc non Af Amer: 14 mL/min — ABNORMAL LOW (ref >60)
GLUCOSE: 93 mg/dL (ref 65–99)
POTASSIUM: 4.1 mmol/L (ref 3.5–5.1)
SODIUM: 143 mmol/L (ref 135–145)
Total Protein: 6.1 g/dL — ABNORMAL LOW (ref 6.5–8.1)

## 2015-02-20 LAB — PROCALCITONIN: PROCALCITONIN: 25.06 ng/mL

## 2015-02-20 LAB — I-STAT CG4 LACTIC ACID, ED
Lactic Acid, Venous: 3.11 mmol/L (ref 0.5–2.0)
Lactic Acid, Venous: 3.37 mmol/L (ref 0.5–2.0)

## 2015-02-20 LAB — TROPONIN I
Troponin I: 0.14 ng/mL — ABNORMAL HIGH (ref <0.031)
Troponin I: 0.64 ng/mL (ref <0.031)

## 2015-02-20 LAB — TSH: TSH: 3.994 u[IU]/mL (ref 0.350–4.500)

## 2015-02-20 LAB — CBG MONITORING, ED
GLUCOSE-CAPILLARY: 62 mg/dL — AB (ref 65–99)
GLUCOSE-CAPILLARY: 108 mg/dL — AB (ref 65–99)

## 2015-02-20 LAB — URINE MICROSCOPIC-ADD ON

## 2015-02-20 LAB — POCT I-STAT 3, ART BLOOD GAS (G3+)
ACID-BASE DEFICIT: 6 mmol/L — AB (ref 0.0–2.0)
Bicarbonate: 21.1 mEq/L (ref 20.0–24.0)
O2 SAT: 79 %
PH ART: 7.243 — AB (ref 7.350–7.450)
PO2 ART: 51 mmHg — AB (ref 80.0–100.0)
Patient temperature: 98.5
TCO2: 23 mmol/L (ref 0–100)
pCO2 arterial: 48.9 mmHg — ABNORMAL HIGH (ref 35.0–45.0)

## 2015-02-20 LAB — DIGOXIN LEVEL: DIGOXIN LVL: 1.7 ng/mL (ref 0.8–2.0)

## 2015-02-20 LAB — MRSA PCR SCREENING: MRSA by PCR: NEGATIVE

## 2015-02-20 LAB — GLUCOSE, CAPILLARY: GLUCOSE-CAPILLARY: 94 mg/dL (ref 65–99)

## 2015-02-20 LAB — CBC
HCT: 35.1 % — ABNORMAL LOW (ref 39.0–52.0)
Hemoglobin: 11.6 g/dL — ABNORMAL LOW (ref 13.0–17.0)
MCH: 31.4 pg (ref 26.0–34.0)
MCHC: 33 g/dL (ref 30.0–36.0)
MCV: 94.9 fL (ref 78.0–100.0)
Platelets: 144 10*3/uL — ABNORMAL LOW (ref 150–400)
RBC: 3.7 MIL/uL — ABNORMAL LOW (ref 4.22–5.81)
RDW: 15.2 % (ref 11.5–15.5)
WBC: 15.8 10*3/uL — ABNORMAL HIGH (ref 4.0–10.5)

## 2015-02-20 LAB — CORTISOL: Cortisol, Plasma: 44.4 ug/dL

## 2015-02-20 LAB — BRAIN NATRIURETIC PEPTIDE: B NATRIURETIC PEPTIDE 5: 1192.2 pg/mL — AB (ref 0.0–100.0)

## 2015-02-20 LAB — LACTIC ACID, PLASMA: LACTIC ACID, VENOUS: 3.5 mmol/L — AB (ref 0.5–2.0)

## 2015-02-20 MED ORDER — DEXTROSE 50 % IV SOLN
25.0000 mL | Freq: Once | INTRAVENOUS | Status: AC
  Administered 2015-02-20: 25 mL via INTRAVENOUS

## 2015-02-20 MED ORDER — PANTOPRAZOLE SODIUM 40 MG IV SOLR: 40.0000 mg | Freq: Every day | INTRAVENOUS | Status: DC

## 2015-02-20 MED ORDER — FENTANYL BOLUS VIA INFUSION
25.0000 ug | INTRAVENOUS | Status: DC | PRN
  Filled 2015-02-20: qty 25

## 2015-02-20 MED ORDER — PIPERACILLIN-TAZOBACTAM 3.375 G IVPB 30 MIN
3.3750 g | Freq: Once | INTRAVENOUS | Status: AC
  Administered 2015-02-20: 3.375 g via INTRAVENOUS
  Filled 2015-02-20: qty 50

## 2015-02-20 MED ORDER — FUROSEMIDE 10 MG/ML IJ SOLN
60.0000 mg | Freq: Once | INTRAMUSCULAR | Status: AC
  Administered 2015-02-20: 60 mg via INTRAVENOUS
  Filled 2015-02-20: qty 6

## 2015-02-20 MED ORDER — PIPERACILLIN-TAZOBACTAM IN DEX 2-0.25 GM/50ML IV SOLN
2.2500 g | Freq: Four times a day (QID) | INTRAVENOUS | Status: DC
  Filled 2015-02-20 (×2): qty 50

## 2015-02-20 MED ORDER — SODIUM CHLORIDE 0.9 % IV SOLN: 250.0000 mL | INTRAVENOUS | Status: DC | PRN

## 2015-02-20 MED ORDER — ETOMIDATE 2 MG/ML IV SOLN
INTRAVENOUS | Status: AC | PRN
  Administered 2015-02-20: 30 mg via INTRAVENOUS

## 2015-02-20 MED ORDER — INSULIN ASPART 100 UNIT/ML ~~LOC~~ SOLN: 0.0000 [IU] | SUBCUTANEOUS | Status: DC

## 2015-02-20 MED ORDER — NOREPINEPHRINE BITARTRATE 1 MG/ML IV SOLN
0.0000 ug/min | INTRAVENOUS | Status: DC
  Administered 2015-02-20: 10 ug/min via INTRAVENOUS
  Filled 2015-02-20 (×2): qty 4

## 2015-02-20 MED ORDER — MORPHINE SULFATE 25 MG/ML IV SOLN
10.0000 mg/h | INTRAVENOUS | Status: DC
  Administered 2015-02-20: 10 mg/h via INTRAVENOUS
  Filled 2015-02-20: qty 10

## 2015-02-20 MED ORDER — FUROSEMIDE 10 MG/ML IJ SOLN
INTRAMUSCULAR | Status: AC
  Administered 2015-02-20: 40 mg
  Filled 2015-02-20: qty 4

## 2015-02-20 MED ORDER — DIGOXIN 0.05 MG/ML PO SOLN: 0.1250 mg | Freq: Every day | ORAL | Status: DC

## 2015-02-20 MED ORDER — MIDAZOLAM HCL 2 MG/2ML IJ SOLN: 1.0000 mg | INTRAMUSCULAR | Status: DC | PRN

## 2015-02-20 MED ORDER — IPRATROPIUM-ALBUTEROL 0.5-2.5 (3) MG/3ML IN SOLN: 3.0000 mL | Freq: Four times a day (QID) | RESPIRATORY_TRACT | Status: DC | PRN

## 2015-02-20 MED ORDER — MORPHINE BOLUS VIA INFUSION
5.0000 mg | INTRAVENOUS | Status: DC | PRN
  Filled 2015-02-20: qty 20

## 2015-02-20 MED ORDER — SODIUM CHLORIDE 0.9 % IV BOLUS (SEPSIS)
250.0000 mL | Freq: Once | INTRAVENOUS | Status: AC
  Administered 2015-02-20: 250 mL via INTRAVENOUS

## 2015-02-20 MED ORDER — SODIUM CHLORIDE 0.9 % IV SOLN: INTRAVENOUS | Status: DC

## 2015-02-20 MED ORDER — DEXTROSE 50 % IV SOLN
INTRAVENOUS | Status: AC
  Filled 2015-02-20: qty 50

## 2015-02-20 MED ORDER — CLOPIDOGREL BISULFATE 75 MG PO TABS: 75.0000 mg | ORAL_TABLET | Freq: Every day | ORAL | Status: DC

## 2015-02-20 MED ORDER — SIMVASTATIN 10 MG PO TABS
10.0000 mg | ORAL_TABLET | Freq: Every day | ORAL | Status: DC
  Filled 2015-02-20: qty 1

## 2015-02-20 MED ORDER — VANCOMYCIN HCL 10 G IV SOLR
1500.0000 mg | INTRAVENOUS | Status: AC
  Administered 2015-02-20: 1500 mg via INTRAVENOUS
  Filled 2015-02-20: qty 1500

## 2015-02-20 MED ORDER — SODIUM CHLORIDE 0.9 % IV SOLN
25.0000 ug/h | INTRAVENOUS | Status: DC
  Administered 2015-02-20: 50 ug/h via INTRAVENOUS
  Filled 2015-02-20: qty 50

## 2015-02-20 MED ORDER — LEVOTHYROXINE SODIUM 100 MCG IV SOLR
50.0000 ug | Freq: Every day | INTRAVENOUS | Status: DC
  Filled 2015-02-20: qty 5

## 2015-02-20 MED ORDER — ROCURONIUM BROMIDE 50 MG/5ML IV SOLN
INTRAVENOUS | Status: AC | PRN
  Administered 2015-02-20: 90 mg via INTRAVENOUS

## 2015-02-20 MED ORDER — NOREPINEPHRINE BITARTRATE 1 MG/ML IV SOLN
0.0000 ug/min | INTRAVENOUS | Status: DC
  Filled 2015-02-20: qty 16

## 2015-02-20 MED ORDER — HEPARIN SODIUM (PORCINE) 5000 UNIT/ML IJ SOLN: 5000.0000 [IU] | Freq: Three times a day (TID) | INTRAMUSCULAR | Status: DC

## 2015-02-23 NOTE — ED Provider Notes (Signed)
CSN: [id number]     Arrival date & time 03/12/15  7829 History   First MD Initiated Contact with Patient Mar 12, 2015 [phone]     Chief Complaint  Patient presents with  . Altered Mental Status  . Respiratory Distress    Patient is a 81 y.o. male presenting with altered mental status. The history is provided by the EMS personnel.  Altered Mental Status Presenting symptoms: partial responsiveness   Severity:  Moderate Most recent episode:  Today Timing:  Constant Progression:  Unchanged Chronicity:  New Context: nursing home resident   Associated symptoms: no fever     Past Medical History  Diagnosis Date  . Altered mental status     felt to be secondary to acute delirium on top of a suspected underlying dementia  . Acute renal failure (HCC)     from dehydration brought on by fall  . Pelvic fracture (HCC)     a. s/p fall  . Depression   . Chronic systolic and diastolic heart failure   . Ischemic cardiomyopathy   . Coronary artery disease     a. s/p PTCA of LAD 2004  . Hypertension   . Obesity   . Hypothyroidism   . B12 deficiency   . Mild aortic stenosis   . Dementia     Questionable dementia  . Complete heart block (HCC)     status post dual-chamber pacemaker  . Presence of permanent cardiac pacemaker    Past Surgical History  Procedure Laterality Date  . Cholecystectomy    . Hemiarthroplasty hip Left Aug 07, 2004  . Circumcision  June 21, 2005  . Pacemaker generator change N/A 01/27/2013    Procedure: PACEMAKER GENERATOR CHANGE;  Surgeon: Gardiner Rhyme, MD;  Location: [facility] CATH LAB;  Service: Cardiovascular;  Laterality: N/A;  . Insert / replace / remove pacemaker  2004; 01/2013    MDT pacemaker implanted 2004; gen change by Dr Johney Frame to MDT [id number] 01/2013  . Fracture surgery     History reviewed. No pertinent family history. Social History  Substance Use Topics  . Smoking status: Former Smoker -- 1.00 packs/day for 54 years    Types: Cigarettes  . Smokeless  tobacco: Former Neurosurgeon     Comment: "aquit smoking cigarettes in ~ 1993"  . Alcohol Use: No    Review of Systems  Unable to perform ROS: Patient unresponsive  Constitutional: Negative for fever.   Allergies  Ativan; Codeine; and Promethazine hcl  Home Medications   Prior to Admission medications   Medication Sig Start Date End Date Taking? Authorizing Provider  acetaminophen (TYLENOL) 325 MG tablet Take 650 mg by mouth 2 (two) times daily. For osteoarthritic pain   Yes Historical Provider, MD  ALPRAZolam Prudy Feeler) 0.5 MG tablet Take one tablet by mouth every night at bedtime for anxiety/insomnia; Take one tablet by mouth once daily as needed for anxiety 01/11/15  Yes Kirt Boys, DO  alum & mag hydroxide-simeth (MAALOX/MYLANTA) 200-200-20 MG/5ML suspension Take 30 mLs by mouth every 4 (four) hours as needed for indigestion or heartburn (no longer than 3 days at a time). 02/18/15 02/21/15 Yes Historical Provider, MD  aspirin EC 81 MG tablet Take 81 mg by mouth daily.   Yes Historical Provider, MD  carvedilol (COREG) 3.125 MG tablet Take 3.125 mg by mouth 2 (two) times daily with a meal. For HTN   Yes Historical Provider, MD  cetaphil (CETAPHIL) cream Apply 1 application topically daily as needed (ITCHING).   Yes Historical  Provider, MD  cholecalciferol (VITAMIN D) 1000 UNITS tablet Take 1,000 Units by mouth daily.   Yes Historical Provider, MD  clopidogrel (PLAVIX) 75 MG tablet Take 75 mg by mouth daily.     Yes Historical Provider, MD  CYANOCOBALAMIN IJ Inject 1,000 mLs as directed daily.   Yes Historical Provider, MD  digoxin (LANOXIN) 0.125 MG tablet Take 0.125 mg by mouth daily.   Yes Historical Provider, MD  docusate sodium (COLACE) 100 MG capsule Take 100 mg by mouth 2 (two) times daily. For constipation   Yes Historical Provider, MD  ferrous sulfate 325 (65 FE) MG tablet Take 325 mg by mouth 2 (two) times daily with a meal.   Yes Historical Provider, MD  furosemide (LASIX) 20 MG  tablet Take 20 mg by mouth daily.   Yes Historical Provider, MD  furosemide (LASIX) 20 MG tablet Take 20 mg by mouth daily as needed for fluid or edema.   Yes Historical Provider, MD  insulin glargine (LANTUS) 100 UNIT/ML injection Inject 35 Units into the skin 2 (two) times daily.    Yes Historical Provider, MD  insulin lispro (HUMALOG) 100 UNIT/ML injection Inject 5 Units into the skin 3 (three) times daily as needed for high blood sugar (for BS> 200 dl/ml). 5 units subcutaneously three times daily with meals for BS > 200   Yes Historical Provider, MD  ipratropium-albuterol (DUONEB) 0.5-2.5 (3) MG/3ML SOLN Take 3 mLs by nebulization every 6 (six) hours as needed (wheezing).    Yes Historical Provider, MD  levothyroxine (SYNTHROID, LEVOTHROID) 100 MCG tablet Take 100 mcg by mouth daily.     Yes Historical Provider, MD  Menthol, Topical Analgesic, 4 % GEL Apply 1 application topically 2 (two) times daily. Apply to posterior neck twice daily for chronic pain   Yes Historical Provider, MD  neomycin-bacitracin-polymyxin (NEOSPORIN) ointment Apply 1 application topically every 12 (twelve) hours. Apply to right toe BID   Yes Historical Provider, MD  pantoprazole (PROTONIX) 20 MG tablet Take 20 mg by mouth daily.   Yes Historical Provider, MD  sertraline (ZOLOFT) 25 MG tablet Take 75 mg by mouth daily. For depression 10/12/14  Yes Mahima Glade Lloyd, MD  simvastatin (ZOCOR) 10 MG tablet Take 10 mg by mouth daily at 6 PM.   Yes Historical Provider, MD  temazepam (RESTORIL) 7.5 MG capsule Take one capsule by mouth every night at bedtime as needed for insomnia 10/12/14  Yes Mahima Pandey, MD  UNABLE TO FIND Med Name: Med Pass 240 mL by mouth three times daily for nutritional supplement   Yes Historical Provider, MD   BP 74/51 mmHg  Pulse 77  Temp(Src) 99.3 F (37.4 C) (Rectal)  Resp 34  SpO2 73% Physical Exam  Constitutional: He is oriented to person, place, and time. He appears well-developed and  well-nourished. No distress.  HENT:  Head: Normocephalic and atraumatic.  Mouth/Throat: Oropharynx is clear and moist.  Eyes: EOM are normal.  Neck: Neck supple. No JVD present.  Cardiovascular: Normal rate, regular rhythm, normal heart sounds and intact distal pulses.   Pulmonary/Chest: Effort normal. Tachypnea noted. He has rhonchi (bilaterally).  Abdominal: Soft. He exhibits no distension. There is no tenderness.  Musculoskeletal: Normal range of motion. He exhibits no edema.  Neurological: He is alert and oriented to person, place, and time. GCS eye subscore is 2. GCS verbal subscore is 2. GCS motor subscore is 5.  Exam limited to acuity of condition  Skin: Skin is warm and dry.  Psychiatric: His  behavior is normal.    ED Course  .Intubation Date/Time: 03-Mar-2015 2:27 PM Performed by: Maris Berger Authorized by: Maris Berger Consent: Verbal consent obtained. Consent given by: guardian and power of attorney Indications: respiratory failure and  hypoxemia Intubation method: direct Patient status: paralyzed (RSI) Preoxygenation: NIPPV, nasal cannula. Sedatives: etomidate Paralytic: rocuronium Laryngoscope size: Miller 4 Tube size: 7.0 mm Tube type: cuffed Number of attempts: 1 Cricoid pressure: no Cords visualized: yes Post-procedure assessment: chest rise and CO2 detector Breath sounds: equal and absent over the epigastrium Cuff inflated: yes ETT to lip: 23 cm Tube secured with: ETT holder Chest x-ray interpreted by me, other physician and radiologist. Chest x-ray findings: endotracheal tube in appropriate position Patient tolerance: Patient tolerated the procedure well with no immediate complications  .Central Line Date/Time: 03-03-15 2:29 PM Performed by: Maris Berger Authorized by: Maris Berger Consent: Verbal consent obtained. Consent given by: guardian and power of attorney Required items: required blood products, implants, devices, and special  equipment available Indications: vascular access Anesthesia: local infiltration Local anesthetic: lidocaine 1% without epinephrine Preparation: skin prepped with ChloraPrep Skin prep agent dried: skin prep agent completely dried prior to procedure Sterile barriers: all five maximum sterile barriers used - cap, mask, sterile gown, sterile gloves, and large sterile sheet Hand hygiene: hand hygiene performed prior to central venous catheter insertion Location details: right internal jugular Patient position: Trendelenburg Catheter type: triple lumen Pre-procedure: landmarks identified Ultrasound guidance: yes Sterile ultrasound techniques: sterile gel and sterile probe covers were used Number of attempts: 1 Successful placement: yes Post-procedure: line sutured and dressing applied Assessment: blood return through all ports,  free fluid flow,  placement verified by x-ray and no pneumothorax on x-ray Patient tolerance: Patient tolerated the procedure well with no immediate complications     Labs Review Labs Reviewed  CBC - Abnormal; Notable for the following:    WBC 15.8 (*)    RBC 3.70 (*)    Hemoglobin 11.6 (*)    HCT 35.1 (*)    Platelets 144 (*)    All other components within normal limits  COMPREHENSIVE METABOLIC PANEL - Abnormal; Notable for the following:    BUN 72 (*)    Creatinine, Ser 3.49 (*)    Calcium 8.7 (*)    Total Protein 6.1 (*)    Albumin 3.0 (*)    AST 57 (*)    GFR calc non Af Amer 14 (*)    GFR calc Af Amer 16 (*)    All other components within normal limits  URINALYSIS, ROUTINE W REFLEX MICROSCOPIC (NOT AT [facility]) - Abnormal; Notable for the following:    Color, Urine AMBER (*)    APPearance TURBID (*)    Bilirubin Urine SMALL (*)    Ketones, ur 15 (*)    Protein, ur 100 (*)    Leukocytes, UA SMALL (*)    All other components within normal limits  TROPONIN I - Abnormal; Notable for the following:    Troponin I 0.14 (*)    All other components within  normal limits  URINE MICROSCOPIC-ADD ON - Abnormal; Notable for the following:    Squamous Epithelial / LPF 0-5 (*)    Bacteria, UA MANY (*)    All other components within normal limits  BRAIN NATRIURETIC PEPTIDE - Abnormal; Notable for the following:    B Natriuretic Peptide 1192.2 (*)    All other components within normal limits  I-STAT CG4 LACTIC ACID, ED - Abnormal; Notable for the following:  Lactic Acid, Venous 3.11 (*)    All other components within normal limits  I-STAT ARTERIAL BLOOD GAS, ED - Abnormal; Notable for the following:    pH, Arterial 7.298 (*)    pCO2 arterial 45.4 (*)    pO2, Arterial 51.0 (*)    Acid-base deficit 4.0 (*)    All other components within normal limits  CBG MONITORING, ED - Abnormal; Notable for the following:    Glucose-Capillary 62 (*)    All other components within normal limits  CBG MONITORING, ED - Abnormal; Notable for the following:    Glucose-Capillary 108 (*)    All other components within normal limits  I-STAT CG4 LACTIC ACID, ED - Abnormal; Notable for the following:    Lactic Acid, Venous 3.37 (*)    All other components within normal limits  I-STAT ARTERIAL BLOOD GAS, ED - Abnormal; Notable for the following:    pH, Arterial 7.227 (*)    pCO2 arterial 54.1 (*)    pO2, Arterial 61.0 (*)    Acid-base deficit 6.0 (*)    All other components within normal limits  I-STAT ARTERIAL BLOOD GAS, ED - Abnormal; Notable for the following:    pH, Arterial 7.253 (*)    pCO2 arterial 48.5 (*)    pO2, Arterial 52.0 (*)    Acid-base deficit 6.0 (*)    All other components within normal limits  CULTURE, BLOOD (ROUTINE X 2)  CULTURE, BLOOD (ROUTINE X 2)  CULTURE, RESPIRATORY (NON-EXPECTORATED)  DIGOXIN LEVEL  TROPONIN I  TROPONIN I  CORTISOL  BLOOD GAS, ARTERIAL  PROCALCITONIN  TSH  LACTIC ACID, PLASMA  LACTIC ACID, PLASMA    Imaging Review Dg Chest Portable 1 View  Mar 03, 2015  CLINICAL DATA:  Endotracheal tube and central line  placement. EXAM: PORTABLE CHEST 1 VIEW COMPARISON:  03-Mar-2015 at 0942 hours. FINDINGS: Endotracheal to terminates approximately 5.4 cm above the carina. Nasogastric tube is followed into the stomach with the tip projecting beyond the inferior boundary of the image. Left subclavian pacemaker lead tips project over the right atrium and right ventricle. Right IJ central line tip projects over the SVC. Trachea is midline. Heart size stable. Thoracic aorta is calcified. There is patchy airspace opacification bilaterally, left greater than right, with small bilateral pleural effusions, stable. No pneumothorax. IMPRESSION: 1. Satisfactory right IJ central line and left subclavian pacemaker lead placement. No pneumothorax. 2. Congestive heart failure. Electronically Signed   By: Leanna Battles M.D.   On: 2015-03-03 13:01   Dg Chest Portable 1 View  March 03, 2015  CLINICAL DATA:  Dyspnea, on BiPAP, chronic diastolic and systolic heart failure, ischemic cardiomyopathy, coronary artery disease, pacemaker, type II diabetes mellitus, former smoker, hypertension EXAM: PORTABLE CHEST 1 VIEW COMPARISON:  Portable exam 0942 hours compared to 09/13/2014 FINDINGS: LEFT subclavian transvenous pacemaker leads project over RIGHT atrium and RIGHT ventricle, unchanged. Enlargement of cardiac silhouette with pulmonary vascular congestion. BILATERAL interstitial infiltrates compatible pulmonary edema and CHF. Small RIGHT pleural effusion. Atherosclerotic calcification aorta. No pneumothorax. Bones demineralized. IMPRESSION: CHF. Electronically Signed   By: Ulyses Southward M.D.   On: 03-03-2015 09:50   I have personally reviewed and evaluated these images and lab results as part of my medical decision-making.  MDM   Final diagnoses:  Acute respiratory failure with hypoxia (HCC)  Endotracheally intubated  Hypotension, unspecified hypotension type  Lactic acidosis  Pulmonary edema with congestive heart failure (HCC)   81 yo NH  patient with a PMH of CHF, CAD, depression, hypothyroidism  and 3rd degree heart block s/p pacemaker placement who presents with AMS and respiratory distress and altered mental status. Initial sats 70s. Improved to low 80s on NIPPV. CBG 60s. 1/2 amp d50 given. Afebrile. Hypotensive. CXR with bilateral pulmonary edema and cardiomegaly. Feel patient needs intubation soon.   Spoke with daughter Avon Gully) who desires intubation and full resuscitation including CPR, code drugs and electrical shock.   Patient intubated for acute hypoxic respiratory failure with mental status changes. Right IJ CVC placed for vasopressor delivery. Norepinephrine started through IJ.  EKG with wide complex paced rhythm, rate 82, QTc 500, no ectopy.  Will admit to critical care. Family updated on patient status and are aware that prognosis given age, comorbidities and invasive measures already taken is poor.  Discussed with Dr. Fayrene Fearing.   Maris Berger, MD 03-14-2015 1528  Rolland Porter, MD 02/21/15 2241

## 2015-02-23 NOTE — Progress Notes (Signed)
   03-10-15 0932  Clinical Encounter Type  Visited With Family;Health care provider  Visit Type Initial;Critical Care  Referral From Family   Chaplain responded to the patient's daughter in the ED waiting area who was wanting information about her father. Chaplain helped facilitate medical update, and our support is available as needed.   Alda Ponder, Chaplain 03-10-15 9:34 AM

## 2015-02-23 NOTE — ED Notes (Signed)
Pt here from [facility] with c/o resp distress and aloc , pt cbg was 57 at nursing home , pt will open eyes to voice upon arrival

## 2015-02-23 NOTE — Significant Event (Signed)
CRITICAL VALUE ALERT  Critical value received: Lactic Acid 3.5; Troponin 0.64  Date of notification:  02/22/2015  Time of notification:  1645  Critical value read back: yes  Nurse who received alert: Burnard Bunting, RN  MD notifie:  Dr Molli Knock @ [phone]

## 2015-02-23 NOTE — Progress Notes (Signed)
   02/22/15 1437  Clinical Encounter Type  Visited With Family;Health care provider  Visit Type Follow-up;Spiritual support  Referral From Family  Spiritual Encounters  Spiritual Needs Prayer   Chaplain followed up with patient's family. Chaplain offered support and prayer. Chaplain support available as needed.   Alda Ponder, Chaplain 02-22-15 2:38 PM

## 2015-02-23 NOTE — ED Notes (Signed)
Pt sats remain in the 80's on bipap , Md in to intubate pt after talking with family

## 2015-02-23 NOTE — ED Notes (Signed)
Family at bedside talking with MD

## 2015-02-23 NOTE — Progress Notes (Signed)
   2015-02-24 1013  Clinical Encounter Type  Visited With Family;Health care provider  Visit Type Follow-up;Critical Care   Chaplain followed up with patient's daughter, who is having some difficulty deciding goals of care. Daughter is concerned that not offering full scope of care would be an equivalent to murder or killing someone. Chaplain support available as needed.   Alda Ponder, Chaplain 2015/02/24 10:15 AM

## 2015-02-23 NOTE — ED Provider Notes (Addendum)
Patient seen upon arrival with Dr. Margreta Journey.  Respiratory distress. Clinically with crackles and rales. Clinical picture and chest x-ray consistent with congestive heart failure. Hypoxemic. Required BiPAP. I potentially of. Will likely require mechanical ventilation and pressors. Care discussed with family upon arrival by resident. Family desires full code,occluding interventions. However, would only like CPR on a  limited basis, would arrest occur.   ABG shows hypoxemia. Not hypercapnic.  Place on Z-Pak. Given a small fluid bolus for pressures. We will reassess frequently. We'll have further discussion with family should ventilation, central access, and vasopressors be required.  11:30:  She remains hypoxemic and hypotensive. Discussed at length with family. Family wanted to spend some time in the room with the patient before making a decision about further care. After this they are adamant in certain that they want full care including mechanical ventilation and pressors.  I was present for and supervise intubation and central line placement. Both of which were performed by resident. Both are without incident. Follow-up x-ray pending. Blood pressure has improved. Plan will be diuresis and critical care consultation.   Rolland Porter, MD Mar 02, 2015 1027  Rolland Porter, MD March 02, 2015 [phone]

## 2015-02-23 NOTE — Progress Notes (Signed)
Patient terminally extubated per MD order. No complications. RN and family at bedside. RT will continue to monitor. 

## 2015-02-23 NOTE — Progress Notes (Signed)
200 cc morphine and 200 cc fentenyl wasted in sink with Imelda Pillow RN

## 2015-02-23 NOTE — Code Documentation (Signed)
Pt

## 2015-02-23 NOTE — Progress Notes (Signed)
Called by bedside RN, patient is in refractory hypoxemia, over the course of the day the patient has been becoming increasingly hypoxic.  Hemodynamically he also has been dropping his pressure inspite of pressor use.  He has had 100 mg of lasix IV over the course of the day with little response from a urinary output standpoint.  I am concerned about his chances of survival at this point are exceedingly low.  Called family in, confirmed DNR status.  Recommended full comfort at this point because patient is not responding to treatment and comfort should be the goal at this point.  After a long discussion, they understand that chances of survival at this point is next to zero and were agreeable to comfort care.  Will start a morphine drip and extubate patient to 100% NRB.  Do not anticipate him to survive much longer.  Will place withdrawal order set in and once family is ready will extubate.  The patient is critically ill with multiple organ systems failure and requires high complexity decision making for assessment and support, frequent evaluation and titration of therapies, application of advanced monitoring technologies and extensive interpretation of multiple databases.   Critical Care Time devoted to patient care services described in this note is  60  Minutes. This time reflects time of care of this signee Dr Koren Bound. This critical care time does not reflect procedure time, or teaching time or supervisory time of PA/NP/Med student/Med Resident etc but could involve care discussion time.  Alyson Reedy, M.D. [facility] Pulmonary/Critical Care Medicine. Pager: [phone]. After hours pager: [phone].

## 2015-02-23 NOTE — Progress Notes (Signed)
Pt. Was transported to 2M10 without any complications.

## 2015-02-23 NOTE — Progress Notes (Signed)
ANTIBIOTIC CONSULT NOTE - INITIAL  Pharmacy Consult for vancomycin + zosyn Indication: rule out sepsis  Allergies  Allergen Reactions  . Ativan [Lorazepam] Other (See Comments)    unknown  . Codeine Other (See Comments)    unknown  . Promethazine Hcl Other (See Comments)    unknown    Patient Measurements: Height: 6' (182.9 cm) IBW/kg (Calculated) : 77.6  Vital Signs: Temp: 100 F (37.8 C) (11/28 1318) Temp Source: Core (Comment) (11/28 1318) BP: 175/95 mmHg (11/28 1318) Pulse Rate: 93 (11/28 1318) Intake/Output from previous day:   Intake/Output from this shift:    Labs:  Recent Labs  March 15, 2015 0942  WBC 15.8*  HGB 11.6*  PLT 144*  CREATININE 3.49*   CrCl cannot be calculated (Unknown ideal weight.). No results for input(s): VANCOTROUGH, VANCOPEAK, VANCORANDOM, GENTTROUGH, GENTPEAK, GENTRANDOM, TOBRATROUGH, TOBRAPEAK, TOBRARND, AMIKACINPEAK, AMIKACINTROU, AMIKACIN in the last 72 hours.   Microbiology: No results found for this or any previous visit (from the past 720 hour(s)).  Medical History: Past Medical History  Diagnosis Date  . Altered mental status     felt to be secondary to acute delirium on top of a suspected underlying dementia  . Acute renal failure (HCC)     from dehydration brought on by fall  . Pelvic fracture (HCC)     a. s/p fall  . Depression   . Chronic systolic and diastolic heart failure   . Ischemic cardiomyopathy   . Coronary artery disease     a. s/p PTCA of LAD 2004  . Hypertension   . Obesity   . Hypothyroidism   . B12 deficiency   . Mild aortic stenosis   . Dementia     Questionable dementia  . Complete heart block (HCC)     status post dual-chamber pacemaker  . Presence of permanent cardiac pacemaker     Medications:  Anti-infectives    Start     Dose/Rate Route Frequency Ordered Stop   2015-03-15 2200  piperacillin-tazobactam (ZOSYN) IVPB 2.25 g     2.25 g 100 mL/hr over 30 Minutes Intravenous Every 6 hours  Mar 15, 2015 1356     03-15-2015 1345  piperacillin-tazobactam (ZOSYN) IVPB 3.375 g     3.375 g 100 mL/hr over 30 Minutes Intravenous  Once 15-Mar-2015 1336     03/15/2015 1345  vancomycin (VANCOCIN) 1,500 mg in sodium chloride 0.9 % 500 mL IVPB     1,500 mg 250 mL/hr over 120 Minutes Intravenous STAT March 15, 2015 1336 02/21/15 1345     Assessment: 89 yom presented to the ED with AMS and respiratory distress requiring intubation. Now starting empiric vancomycin + zosyn. Tmax is 100 and WBC is elevated at 15.8. Scr is elevated above baseline at 3.49. Lactic is also elevated at 3.37.   Vanc 11/28>> Zosyn 11/28>>  Goal of Therapy:  Vancomycin trough level 15-20 mcg/ml  Plan:  - Vanc 1500mg  IV x 1 then f/u Scr trend for maintenance doses - Zosyn 3.375gm IV x 1 then 2.25gm IV Q6H - F/u renal fxn, C&S, clinical status and trough at [facility]  Burnice Oestreicher, Drake Leach 2015-03-15,1:57 PM

## 2015-02-23 NOTE — H&P (Signed)
PULMONARY / CRITICAL CARE MEDICINE   Name: [name] MRN: [id number] DOB: [birth year]    ADMISSION DATE:  03-20-2015 CONSULTATION DATE:  03-20-15  REFERRING MD :  EDP  CHIEF COMPLAINT:  AMS  HISTORY OF PRESENT ILLNESS:  Pt is encephalopathic; therefore, this HPI is obtained from chart review. [name] is a 81 y.o. male with a PMH as outlined below and who resides at [facility] nursing home.  He was brought to [facility] ED 11/28 due to AMS and unresponsiveness when staff went to check on him.  CBG at the time was 57.  Oral glucose was attempted and was unsuccessful.  He was hypoxic with sats in 70's.  In ED, he was placed on NRB followed by BiPAP; however, had little improvement.  After discussion with family, decision was made to intubate pt for temporary support.  PCCM was called for admission.  Pt's daughter informs me that pt has had decline in health recently.  He had an accident where he was in a wheelchair and fell out of it back in the summer of 2016, and ever since then, he has had decline in functional status and amount of support needed in ADL's, etc.  Daughter informs me that pt has always said that "he would want to live"; however, she realizes that his quality of life has certainly declined since his accident.   After extensive discussions with daughter and two sons. We discussed [name]'s current circumstances and organ failures. We also discussed patient's prior wishes under circumstances such as this (prolonged life support, etc). The family has decided not to perform resuscitation if arrest were to occur, but to otherwise continue with current medical support / therapies.    PAST MEDICAL HISTORY :  He  has a past medical history of Altered mental status; Acute renal failure (HCC); Pelvic fracture (HCC); Depression; Chronic systolic and diastolic heart failure; Ischemic cardiomyopathy; Coronary artery disease; Hypertension; Obesity; Hypothyroidism; B12 deficiency; Mild  aortic stenosis; Dementia; Complete heart block (HCC); and Presence of permanent cardiac pacemaker.  PAST SURGICAL HISTORY: He  has past surgical history that includes Cholecystectomy; Hemiarthroplasty hip (Left, Aug 07, 2004); Circumcision (June 21, 2005); pacemaker generator change (N/A, 01/27/2013); Insert / replace / remove pacemaker (2004; 01/2013); and Fracture surgery.  Allergies  Allergen Reactions  . Ativan [Lorazepam] Other (See Comments)    unknown  . Codeine Other (See Comments)    unknown  . Promethazine Hcl Other (See Comments)    unknown    No current facility-administered medications on file prior to encounter.   Current Outpatient Prescriptions on File Prior to Encounter  Medication Sig  . acetaminophen (TYLENOL) 325 MG tablet Take 650 mg by mouth 2 (two) times daily. For osteoarthritic pain  . ALPRAZolam (XANAX) 0.5 MG tablet Take one tablet by mouth every night at bedtime for anxiety/insomnia; Take one tablet by mouth once daily as needed for anxiety  . aspirin EC 81 MG tablet Take 81 mg by mouth daily.  . carvedilol (COREG) 3.125 MG tablet Take 3.125 mg by mouth 2 (two) times daily with a meal. For HTN  . cholecalciferol (VITAMIN D) 1000 UNITS tablet Take 1,000 Units by mouth daily.  . clopidogrel (PLAVIX) 75 MG tablet Take 75 mg by mouth daily.    . digoxin (LANOXIN) 0.125 MG tablet Take 0.125 mg by mouth daily.  Marland Kitchen docusate sodium (COLACE) 100 MG capsule Take 100 mg by mouth 2 (two) times daily. For constipation  . ferrous sulfate  325 (65 FE) MG tablet Take 325 mg by mouth 2 (two) times daily with a meal.  . insulin glargine (LANTUS) 100 UNIT/ML injection Inject 35 Units into the skin 2 (two) times daily.   . insulin lispro (HUMALOG) 100 UNIT/ML injection Inject 5 Units into the skin 3 (three) times daily as needed for high blood sugar (for BS> 200 dl/ml). 5 units subcutaneously three times daily with meals for BS > 200  . ipratropium-albuterol (DUONEB) 0.5-2.5 (3)  MG/3ML SOLN Take 3 mLs by nebulization every 6 (six) hours as needed (wheezing).   Marland Kitchen levothyroxine (SYNTHROID, LEVOTHROID) 100 MCG tablet Take 100 mcg by mouth daily.    . Menthol, Topical Analgesic, 4 % GEL Apply 1 application topically 2 (two) times daily. Apply to posterior neck twice daily for chronic pain  . pantoprazole (PROTONIX) 20 MG tablet Take 20 mg by mouth daily.  . sertraline (ZOLOFT) 25 MG tablet Take 75 mg by mouth daily. For depression  . temazepam (RESTORIL) 7.5 MG capsule Take one capsule by mouth every night at bedtime as needed for insomnia  . UNABLE TO FIND Med Name: Med Pass 240 mL by mouth three times daily for nutritional supplement    FAMILY HISTORY:  His has no family status information on file.   SOCIAL HISTORY: He  reports that he has quit smoking. His smoking use included Cigarettes. He has a 54 pack-year smoking history. He has quit using smokeless tobacco. He reports that he does not drink alcohol or use illicit drugs.  REVIEW OF SYSTEMS:  Unable to complete as pt is encephalopathic.  SUBJECTIVE: Unresponsive on vent.  VITAL SIGNS: BP 175/95 mmHg  Pulse 93  Temp(Src) 100 F (37.8 C) (Core (Comment))  Resp 14  Ht 6' (1.829 m)  SpO2 98%  HEMODYNAMICS:    VENTILATOR SETTINGS: Vent Mode:  [-] PRVC FiO2 (%):  [100 %] 100 % Set Rate:  [10 bmp-20 bmp] 20 bmp Vt Set:  [883 mL] 620 mL PEEP:  [10 cmH20-12 cmH20] 12 cmH20 Plateau Pressure:  [29 cmH20] 29 cmH20  INTAKE / OUTPUT:    PHYSICAL EXAMINATION: General: Elderly, chronically ill appearing male, in NAD. Neuro: Sedated on vent, does not follow commands. HEENT: [place]/AT. PERRL. Cardiovascular: RRR, no M/R/G.  Lungs: Respirations even and unlabored.  Crackles bilaterally. Abdomen: BS x 4, soft, NT/ND.  Musculoskeletal: No gross deformities, 1+ non-pitting LE edema.  Skin: Intact, warm, no rashes.  LABS:  CBC  Recent Labs Lab 03/16/2015 0942  WBC 15.8*  HGB 11.6*  HCT 35.1*  PLT 144*    Coag's No results for input(s): APTT, INR in the last 168 hours. BMET  Recent Labs Lab 2015/03/16 0942  NA 143  K 4.1  CL 108  CO2 23  BUN 72*  CREATININE 3.49*  GLUCOSE 93   Electrolytes  Recent Labs Lab 03-16-15 0942  CALCIUM 8.7*   Sepsis Markers  Recent Labs Lab 2015-03-16 0943  LATICACIDVEN 3.11*   ABG  Recent Labs Lab 03-16-15 1016 March 16, 2015 1218 03/16/2015 1343  PHART 7.298* 7.227* 7.253*  PCO2ART 45.4* 54.1* 48.5*  PO2ART 51.0* 61.0* 52.0*   Liver Enzymes  Recent Labs Lab 03/16/2015 0942  AST 57*  ALT 45  ALKPHOS 57  BILITOT 1.0  ALBUMIN 3.0*   Cardiac Enzymes  Recent Labs Lab March 16, 2015 1026  TROPONINI 0.14*   Glucose  Recent Labs Lab 03-16-15 0920 03/16/2015 1013  GLUCAP 62* 108*    Imaging Dg Chest Portable 1 View  2015-03-16  CLINICAL DATA:  Endotracheal tube and central line placement. EXAM: PORTABLE CHEST 1 VIEW COMPARISON:  Feb 24, 2015 at 0942 hours. FINDINGS: Endotracheal to terminates approximately 5.4 cm above the carina. Nasogastric tube is followed into the stomach with the tip projecting beyond the inferior boundary of the image. Left subclavian pacemaker lead tips project over the right atrium and right ventricle. Right IJ central line tip projects over the SVC. Trachea is midline. Heart size stable. Thoracic aorta is calcified. There is patchy airspace opacification bilaterally, left greater than right, with small bilateral pleural effusions, stable. No pneumothorax. IMPRESSION: 1. Satisfactory right IJ central line and left subclavian pacemaker lead placement. No pneumothorax. 2. Congestive heart failure. Electronically Signed   By: Leanna Battles M.D.   On: 02-24-2015 13:01   Dg Chest Portable 1 View  24-Feb-2015  CLINICAL DATA:  Dyspnea, on BiPAP, chronic diastolic and systolic heart failure, ischemic cardiomyopathy, coronary artery disease, pacemaker, type II diabetes mellitus, former smoker, hypertension EXAM: PORTABLE CHEST  1 VIEW COMPARISON:  Portable exam 0942 hours compared to 09/13/2014 FINDINGS: LEFT subclavian transvenous pacemaker leads project over RIGHT atrium and RIGHT ventricle, unchanged. Enlargement of cardiac silhouette with pulmonary vascular congestion. BILATERAL interstitial infiltrates compatible pulmonary edema and CHF. Small RIGHT pleural effusion. Atherosclerotic calcification aorta. No pneumothorax. Bones demineralized. IMPRESSION: CHF. Electronically Signed   By: Ulyses Southward M.D.   On: February 24, 2015 09:50     STUDIES:  CXR 11/28 > CHF  CULTURES: Blood 11/28 > Urine 11/28 > Sputum 11/28 >  ANTIBIOTICS: Vanc 11/28 > Zosyn 11/28 >  SIGNIFICANT EVENTS: 11/28 - admitted with AMS, acute hypoxic respiratory failure, AKI, probable CHF exacerbation.  LINES/TUBES: R IJ CVL 11/28 > ETT 11/28 >   ASSESSMENT / PLAN:  PULMONARY OETT 11/28 > A: Acute hypoxic respiratory failure - multifactorial but suspect primarily due to CHF exacerbation.  Consider PNA, can not rule out underlying infiltrate at this point. P:   Full vent support. Wean as able - currently requiring 100% FiO2 with PEEP 12. VAP prevention measures. SBT in AM if able. Continue outpatient DuoNebs. CXR in AM.  CARDIOVASCULAR CVL R IJ 11/28 > A:  Shock - suspect cardiogenic due to CHF exacerbation / decompensated heart failure. Probable CHF exacerbation - last echo from 2010 with EF 40 - 45%. Troponin leak - suspect due to demand ischemia; EKG without acute changes. Hx HTN, ICM, CAD s/p PCI 2004, CHB s/p PPM placement. DNR STATUS P:  Assess echo, CVP, BNP. Continue levophed as needed to maintain SpO2 > 90%. Trend troponins / lactate. Ultimately needs aggressive diuresis; however, this is restricted due to hypotension + AKI. Continue outpatient Plavix, ASA, digoxin, simvastatin. Hold outpatient carvedilol, furosemide.  RENAL A:   AKI - likely combination of pre-renal and ATN. Pseudohypocalcemia - corrects to  9.5. P:   Would not be candidate for dialysis despite if remains oliguric / anuric. Assess ionized calcium. BMP in AM.  GASTROINTESTINAL A:   GI prophylaxis. Nutrition. P:   SUP: Pantoprazole. NPO.  HEMATOLOGIC A:   VTE Prophylaxis. P:  SCD's / Plavix / ASA. CBC in AM.  INFECTIOUS A:   R/o HCAP - CXR with bilateral edema making it difficult to exclude underlying infiltrate. P:   Abx: Vanc, Zosyn Follow cultures. PCT algorithm to limit abx exposure - if low then consider d/c abx.  ENDOCRINE A:   DM. Hypothyroidism. P:   SSI. Continue outpatient synthroid, change to IV formulation. Assess TSH.  NEUROLOGIC A:   AMS / acute  metabolic encephalopathy. Hx Depression. P:   Sedation:  Fentanyl gtt / Midazolam PRN. RASS goal: 0 to -1. Daily WUA. Hold outpatient alprazolam, sertraline, temazepam.   Family updated: Daughter, 2 sons, grandchildren at bedside.  I have had extensive discussions with them regarding Mr. Windhorst's current circumstances and organ failures. We also discussed patient's prior wishes under circumstances such as this (specifically prolonged life support and CPR). The family has decided not to perform resuscitation if arrest were to occur, but to otherwise continue with current medical support / therapies.  If no improvement / progress as far as vent requirements go over the next 24 - 48 hours, then additional discussions with family will be needed.   Interdisciplinary Family Meeting v Palliative Care Meeting:  Due by: 12/4.   Rutherford Guys, [place] [facility] Pager: [phone]  or [phone] 2015-03-01, 1:46 PM  Attending Note:  81 year old male with CHF and associated pulmonary edema and respiratory failure.  Family wanted everything done and ED intubated him and placed TLC and started levophed.  Had an extensive conversation with the family.  Made DNR.  If patient does not show improvement in the next  few days then likely proceed with full comfort.  On exam, patient is completely unresponsive.  Will stop propofol and start levophed.  DNR order in place.  Patient seen and examined, agree with above note.  I dictated the care and orders written for this patient under my direction.  Alyson Reedy, MD [phone]

## 2015-02-23 DEATH — deceased

## 2015-02-25 LAB — CULTURE, BLOOD (ROUTINE X 2)
Culture: NO GROWTH
Culture: NO GROWTH

## 2015-02-27 ENCOUNTER — Telehealth: Payer: Self-pay

## 2015-02-27 NOTE — Telephone Encounter (Signed)
On 02/27/2015 I received a death certificate from [employer] (original). The death certificate is for cremation. The patient is a patient of Doctor Molli Knock. The death certificate will be taken to [employer] this pm for signature.

## 2015-03-01 ENCOUNTER — Telehealth: Payer: Self-pay

## 2015-03-01 NOTE — Telephone Encounter (Signed)
On 03/01/2015 I received a death certificate from [employer] (original). The death certificate is for cremation. The patient is a patient of Doctor Molli Knock. The death certificate will be taken to [facility]) on Monday because Molli Knock is on vacation till then. On 04/01/2015 I received the death certificate back from Doctor Molli Knock. I got the death certificate ready and called the funeral home to let them know death certificate is ready for pickup.

## 2015-03-01 NOTE — Telephone Encounter (Signed)
On 02/28/2015 I received the death certificate back from Doctor Molli Knock. I got the death certificate ready and faxed the death certificate over to the funeral home per their request.

## 2015-03-26 NOTE — Discharge Summary (Signed)
[name], [name] NO.:  [id number]  MEDICAL RECORD NO.:  [id number]  LOCATION:  2M10C                        FACILITY:  [facility]  PHYSICIAN:  Felipa Evener, MD  DATE OF BIRTH:  [birth year]  DATE OF ADMISSION:  03-05-2015 DATE OF DISCHARGE:  03-05-2015                              DISCHARGE SUMMARY   DEATH SUMMARY  PRIMARY DIAGNOSIS/CAUSE OF DEATH:  Hypoxemic respiratory failure.  SECONDARY DIAGNOSES:  Acute encephalopathy, cardiogenic shock, acute kidney injury, diabetes mellitus, hypothyroidism, and acute metabolic encephalopathy.  The patient is a 82 year old male, who has been declining health for the past 6 months who resides in a nursing home, after declining after a fall in the summer of 2016.  The patient was being evaluated by the nursing home staff where he was found to be unresponsive at which point, decision was made to bring the patient to the ED.  In the ED, the patient failed to show any signs of improvement of his acute mental status deterioration.  He was placed on 100% non-rebreather with failure to improving oxygenation and was placed on BiPAP again with failure. __________ spoke with the family who agreed that the patient for short- term basis, the patient was intubated.  __________ was called to admit. Upon re-evaluation, the patient __________ suffering from refractory hypoxia and refractory cardiogenic shock despite of maximal support.  At this point, it was obvious that the patient will not survive this admission.  I had a prolonged discussion with the family, who agreed that the patient has been suffering for the last 6 months, and he would not want live under these conditions, therefore decision was made to start comfort measure and to extubate the patient __________ the patient expired shortly thereafter comfortably.     Felipa Evener, MD     WJY/MEDQ  D:  02/27/2015  T:  02/28/2015  Job:  [id number]

## 2015-08-15 IMAGING — CT CT HEAD W/O CM
2 series · 15 of 30 positions shown, 19 images · non-contrast
Comparison: 09/13/2014

CLINICAL DATA: Fall 6 days ago with headache and vomiting.

EXAM:
CT HEAD WITHOUT CONTRAST
TECHNIQUE: Contiguous axial images were obtained from the base of the skull
through the vertex without intravenous contrast.

[Series 201: head w/o, idose (1) · axial · non-contrast · 0.46mm/px · z∈[+222,+347]mm · 13 of 31 slices shown, 17 images]
[im 3/31  brain]
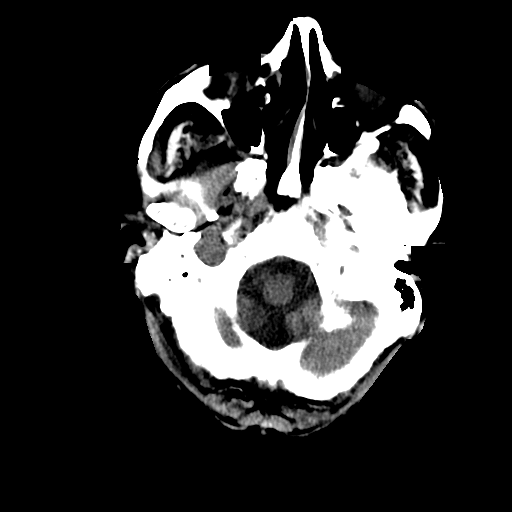
[im 3/31  bone]
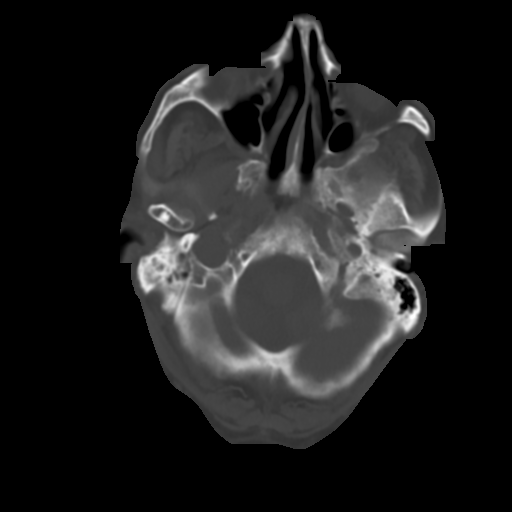
[im 5/31  brain]
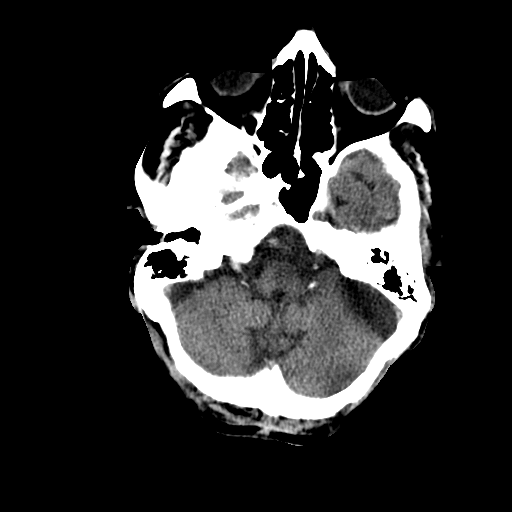
[im 7/31  brain]
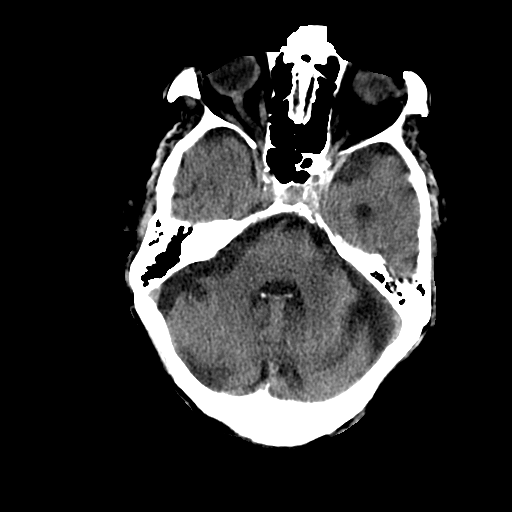
[im 9/31  brain]
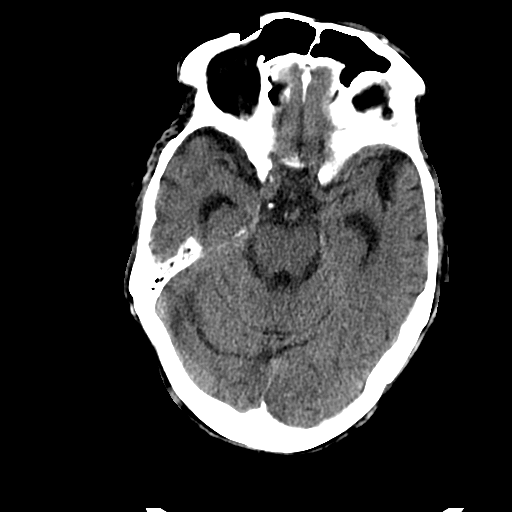
[im 11/31  brain]
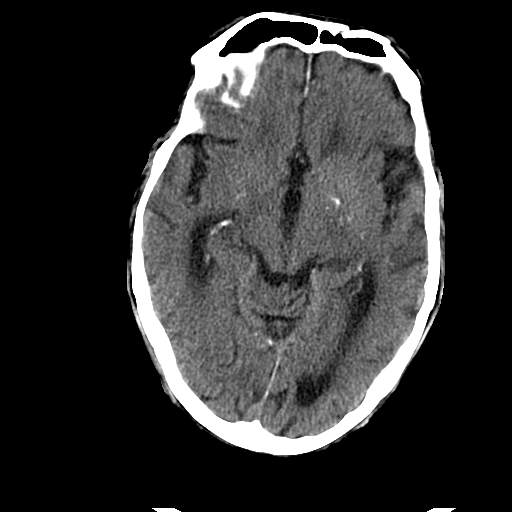
[im 11/31  bone]
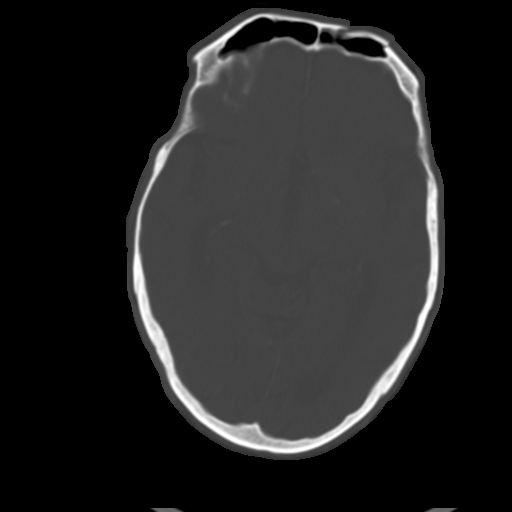
[im 13/31  brain]
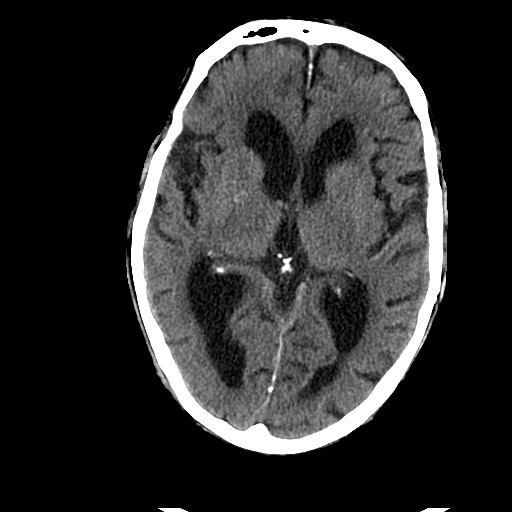
[im 16/31  brain]
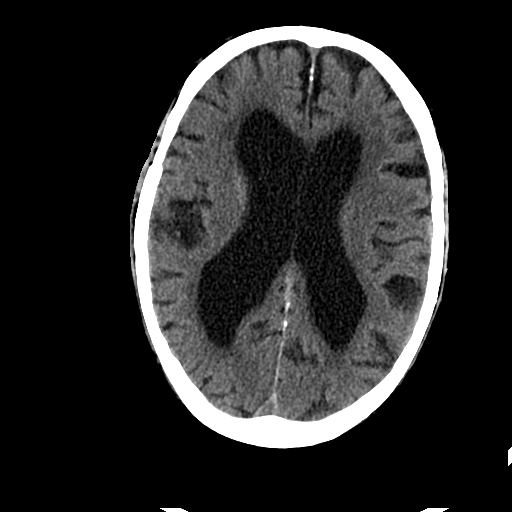
[im 18/31  brain]
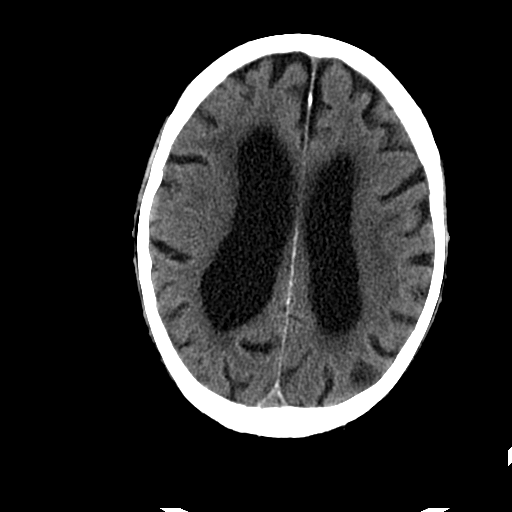
[im 20/31  brain]
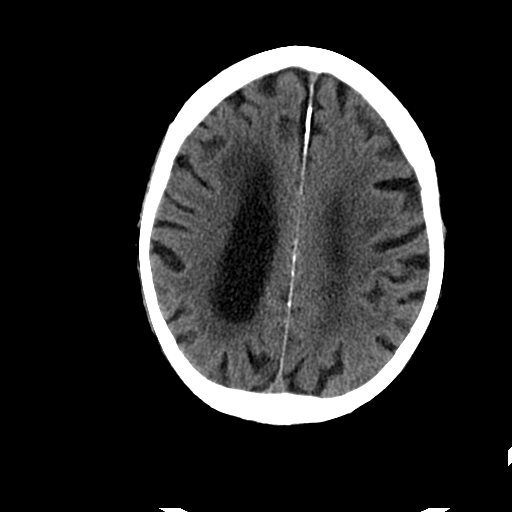
[im 20/31  bone]
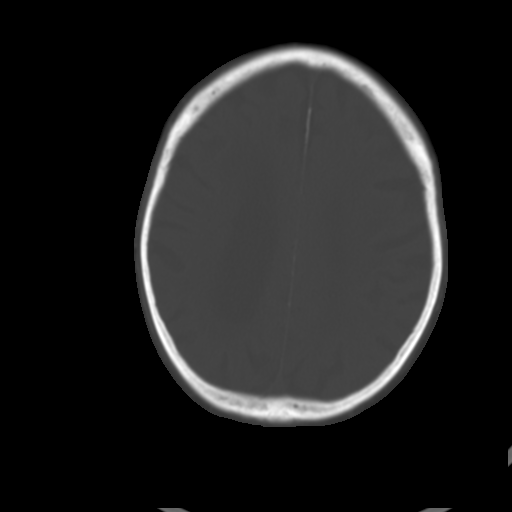
[im 22/31  brain]
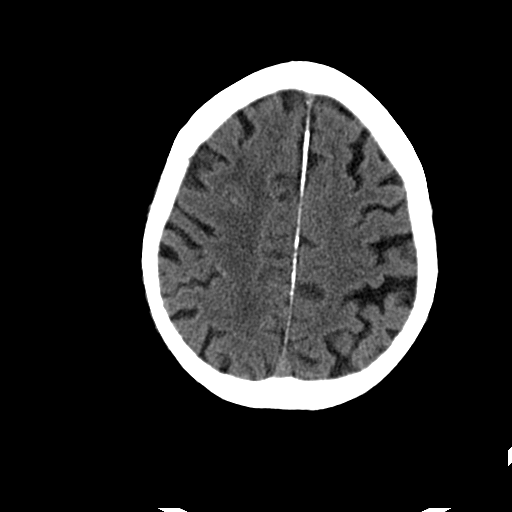
[im 24/31  brain]
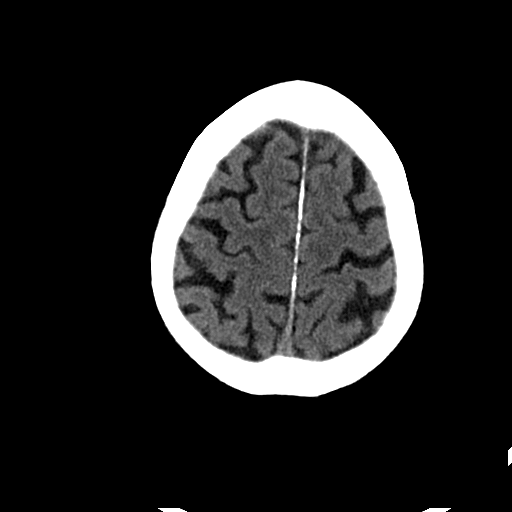
[im 26/31  brain]
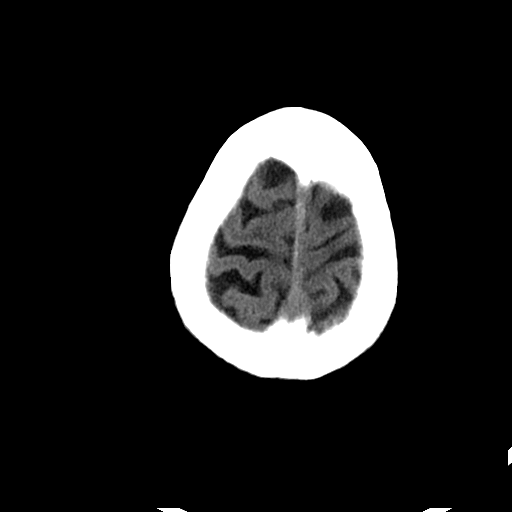
[im 28/31  brain]
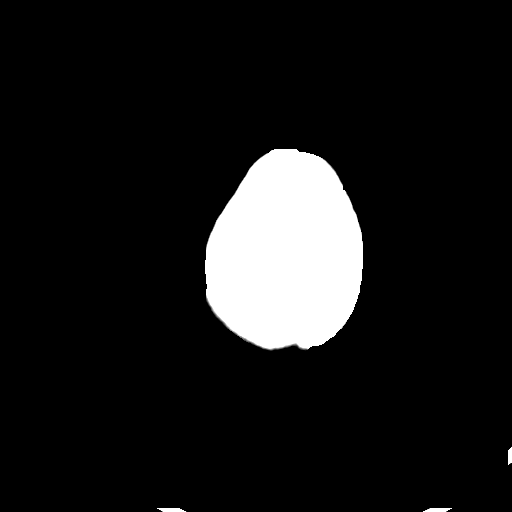
[im 28/31  bone]
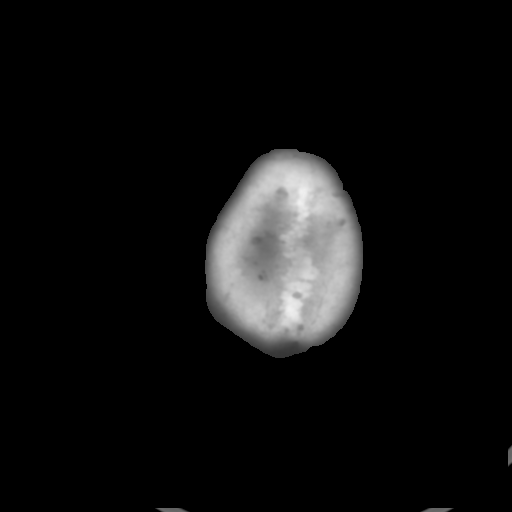

[Series 202: head w/o bone, idose (1) · axial · non-contrast · 0.46mm/px · z∈[+222,+242]mm · 2 of 31 slices shown]
[im 3/31  bone]
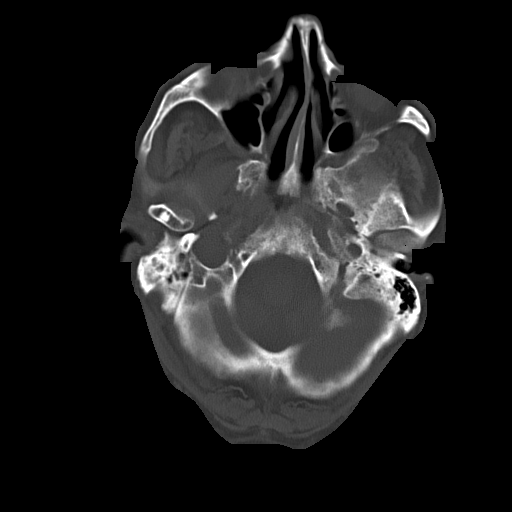
[im 7/31  bone]
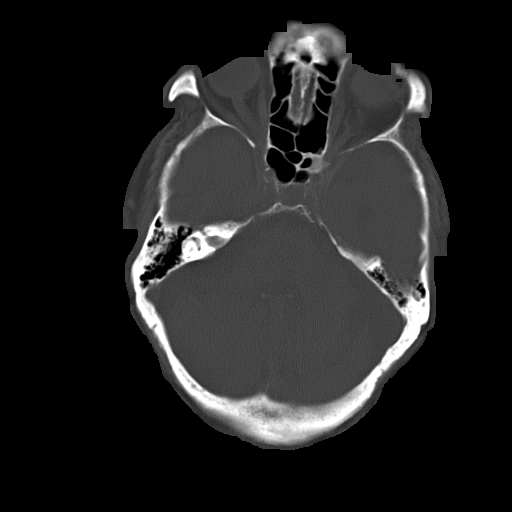

[15 of 30 positions shown; findings below may reference images not displayed]

FINDINGS: Since the prior study, the depressed left frontal sinus fracture
shows partial healing with some mild cortical depression remaining.
The underlying frontal sinus is now normally aerated. Overlying
extensive left frontal and orbital hematoma seen previously has
resolved. Hemorrhage in the right maxillary antrum has resolved with
some underlying mucosal thickening present.

The brain shows stable marked atrophy and periventricular small
vessel ischemic changes. The brain demonstrates no evidence of
hemorrhage, infarction, edema, mass effect, extra-axial fluid
collection, hydrocephalus or mass lesion.
IMPRESSION: No acute findings. Partial healing of the recent depressed left
frontal sinus fracture which demonstrates some mild persistent
depression. The brain shows stable atrophy and small vessel disease.

## 2015-09-04 IMAGING — RF DG SWALLOWING FUNCTION
1 series · 1 of 1 positions shown · non-contrast
Comparison: None.

CLINICAL DATA: Dysphagia.  Cough.  Gastroesophageal reflux disease.

EXAM:
MODIFIED BARIUM SWALLOW
TECHNIQUE: Different consistencies of barium were administered orally to the
patient by the Speech Pathologist. Imaging of the pharynx was
performed in the lateral projection.
FLUOROSCOPY TIME:  Fluoroscopy Time:  1 minutes 58 seconds
Number of Acquired Images:  1

[Series 1: run · 1 of 1 slices shown]
[im 1/1]
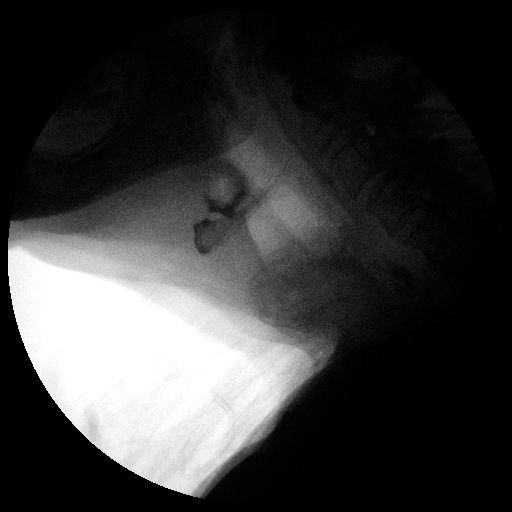

[1 of 1 positions shown; findings below may reference images not displayed]

FINDINGS: Thin liquid- vestibular penetration occurred with cup sips of thin
liquid. No aspiration was seen. Mild to moderate vallecular
retention also noted. The retention cleared with subsequent
swallows.

Nectar thick liquid- no evidence of penetration or aspiration. Mild
to moderate vallecular retention.

Gilz?Ledy mild to moderate vallecular retention.

Gilz?Habsah with cracker- mild-to-moderate vallecular retention

Barium tablet -  vallecular retention of barium tablet noted
IMPRESSION: Mild to moderate vallecular retention, which cleared with subsequent
swallows.

Vestibular penetration with cup sips of thin liquid. No evidence of
aspiration.

Please refer to the Speech Pathologists report for complete details
and recommendations.
